# Patient Record
Sex: Female | Born: 1935 | Race: White | Hispanic: No | Smoking: Former smoker
Health system: Southern US, Community
[De-identification: ages and names within clinical notes are randomized; demographics above are authoritative.]

## PROBLEM LIST (undated history)

## (undated) DIAGNOSIS — J189 Pneumonia, unspecified organism: Secondary | ICD-10-CM

## (undated) DIAGNOSIS — I10 Essential (primary) hypertension: Secondary | ICD-10-CM

## (undated) DIAGNOSIS — R011 Cardiac murmur, unspecified: Secondary | ICD-10-CM

## (undated) DIAGNOSIS — E785 Hyperlipidemia, unspecified: Secondary | ICD-10-CM

## (undated) DIAGNOSIS — I6529 Occlusion and stenosis of unspecified carotid artery: Secondary | ICD-10-CM

## (undated) DIAGNOSIS — M1990 Unspecified osteoarthritis, unspecified site: Secondary | ICD-10-CM

## (undated) HISTORY — DX: Occlusion and stenosis of unspecified carotid artery: I65.29

## (undated) HISTORY — PX: EYE SURGERY: SHX253

## (undated) HISTORY — DX: Hyperlipidemia, unspecified: E78.5

## (undated) HISTORY — DX: Essential (primary) hypertension: I10

---

## 1960-10-09 HISTORY — PX: OTHER SURGICAL HISTORY: SHX169

## 1993-10-09 HISTORY — PX: ORIF ANKLE FRACTURE: SUR919

## 2004-03-09 DIAGNOSIS — I10 Essential (primary) hypertension: Secondary | ICD-10-CM | POA: Insufficient documentation

## 2004-03-09 HISTORY — DX: Essential (primary) hypertension: I10

## 2004-09-20 ENCOUNTER — Ambulatory Visit: Payer: Self-pay | Admitting: Family Medicine

## 2004-09-20 LAB — CONVERTED CEMR LAB: Blood Glucose, Fasting: 102 mg/dL

## 2004-12-07 DIAGNOSIS — E785 Hyperlipidemia, unspecified: Secondary | ICD-10-CM | POA: Insufficient documentation

## 2004-12-07 HISTORY — DX: Hyperlipidemia, unspecified: E78.5

## 2004-12-15 ENCOUNTER — Ambulatory Visit: Payer: Self-pay | Admitting: Family Medicine

## 2004-12-15 ENCOUNTER — Other Ambulatory Visit: Admission: RE | Admit: 2004-12-15 | Discharge: 2004-12-15 | Payer: Self-pay | Admitting: Family Medicine

## 2004-12-22 ENCOUNTER — Encounter: Payer: Self-pay | Admitting: Family Medicine

## 2004-12-22 LAB — CONVERTED CEMR LAB: Pap Smear: NORMAL

## 2004-12-27 ENCOUNTER — Ambulatory Visit: Payer: Self-pay | Admitting: Family Medicine

## 2005-01-02 ENCOUNTER — Ambulatory Visit: Payer: Self-pay | Admitting: Family Medicine

## 2005-01-02 ENCOUNTER — Encounter: Admission: RE | Admit: 2005-01-02 | Discharge: 2005-01-02 | Payer: Self-pay | Admitting: Family Medicine

## 2005-03-17 ENCOUNTER — Ambulatory Visit: Payer: Self-pay | Admitting: Family Medicine

## 2005-03-21 ENCOUNTER — Ambulatory Visit: Payer: Self-pay | Admitting: Family Medicine

## 2005-05-02 ENCOUNTER — Ambulatory Visit: Payer: Self-pay | Admitting: Family Medicine

## 2005-06-19 ENCOUNTER — Ambulatory Visit: Payer: Self-pay | Admitting: Family Medicine

## 2005-06-23 ENCOUNTER — Ambulatory Visit: Payer: Self-pay | Admitting: Family Medicine

## 2005-12-07 DIAGNOSIS — R7989 Other specified abnormal findings of blood chemistry: Secondary | ICD-10-CM | POA: Insufficient documentation

## 2005-12-15 ENCOUNTER — Ambulatory Visit: Payer: Self-pay | Admitting: Family Medicine

## 2005-12-15 LAB — CONVERTED CEMR LAB: Blood Glucose, Fasting: 106 mg/dL

## 2005-12-20 ENCOUNTER — Encounter: Payer: Self-pay | Admitting: Family Medicine

## 2005-12-20 ENCOUNTER — Other Ambulatory Visit: Admission: RE | Admit: 2005-12-20 | Discharge: 2005-12-20 | Payer: Self-pay | Admitting: Family Medicine

## 2005-12-20 ENCOUNTER — Ambulatory Visit: Payer: Self-pay | Admitting: Family Medicine

## 2005-12-20 LAB — CONVERTED CEMR LAB: Pap Smear: NORMAL

## 2006-01-04 ENCOUNTER — Encounter: Payer: Self-pay | Admitting: Family Medicine

## 2006-01-04 LAB — CONVERTED CEMR LAB: TSH: 1.55 microintl units/mL

## 2006-01-05 ENCOUNTER — Encounter: Admission: RE | Admit: 2006-01-05 | Discharge: 2006-01-05 | Payer: Self-pay | Admitting: Family Medicine

## 2006-01-10 ENCOUNTER — Ambulatory Visit: Payer: Self-pay | Admitting: Family Medicine

## 2006-01-18 ENCOUNTER — Ambulatory Visit: Payer: Self-pay | Admitting: Family Medicine

## 2006-01-18 LAB — CONVERTED CEMR LAB: Blood Glucose, Fasting: 98 mg/dL

## 2006-01-31 ENCOUNTER — Encounter: Admission: RE | Admit: 2006-01-31 | Discharge: 2006-01-31 | Payer: Self-pay | Admitting: Family Medicine

## 2006-02-20 ENCOUNTER — Ambulatory Visit: Payer: Self-pay | Admitting: Family Medicine

## 2006-07-24 ENCOUNTER — Ambulatory Visit: Payer: Self-pay | Admitting: Family Medicine

## 2006-08-02 ENCOUNTER — Encounter: Admission: RE | Admit: 2006-08-02 | Discharge: 2006-08-02 | Payer: Self-pay | Admitting: Family Medicine

## 2006-08-23 ENCOUNTER — Ambulatory Visit: Payer: Self-pay | Admitting: Family Medicine

## 2006-12-14 ENCOUNTER — Encounter: Payer: Self-pay | Admitting: Family Medicine

## 2006-12-14 LAB — CONVERTED CEMR LAB: Pap Smear: NORMAL

## 2006-12-27 ENCOUNTER — Ambulatory Visit: Payer: Self-pay | Admitting: Family Medicine

## 2006-12-27 LAB — CONVERTED CEMR LAB
ALT: 26 units/L (ref 0–40)
Alkaline Phosphatase: 76 units/L (ref 39–117)
BUN: 16 mg/dL (ref 6–23)
CO2: 32 meq/L (ref 19–32)
Calcium: 9.4 mg/dL (ref 8.4–10.5)
Creatinine, Ser: 0.7 mg/dL (ref 0.4–1.2)
GFR calc Af Amer: 107 mL/min
Microalb Creat Ratio: 4.1 mg/g (ref 0.0–30.0)
TSH: 1.57 microintl units/mL
Total CHOL/HDL Ratio: 3.4
Total Protein: 6.7 g/dL (ref 6.0–8.3)

## 2006-12-31 ENCOUNTER — Encounter: Payer: Self-pay | Admitting: Family Medicine

## 2006-12-31 ENCOUNTER — Other Ambulatory Visit: Admission: RE | Admit: 2006-12-31 | Discharge: 2006-12-31 | Payer: Self-pay | Admitting: Family Medicine

## 2006-12-31 ENCOUNTER — Ambulatory Visit: Payer: Self-pay | Admitting: Family Medicine

## 2007-01-07 ENCOUNTER — Encounter: Admission: RE | Admit: 2007-01-07 | Discharge: 2007-01-07 | Payer: Self-pay | Admitting: Family Medicine

## 2007-01-09 ENCOUNTER — Encounter: Payer: Self-pay | Admitting: Family Medicine

## 2007-01-21 ENCOUNTER — Ambulatory Visit: Payer: Self-pay | Admitting: Family Medicine

## 2007-02-26 ENCOUNTER — Ambulatory Visit: Payer: Self-pay | Admitting: Family Medicine

## 2007-04-09 ENCOUNTER — Ambulatory Visit: Payer: Self-pay | Admitting: Family Medicine

## 2007-04-09 LAB — CONVERTED CEMR LAB: ALT: 16 units/L (ref 0–35)

## 2007-05-23 ENCOUNTER — Encounter: Payer: Self-pay | Admitting: Family Medicine

## 2007-05-24 ENCOUNTER — Ambulatory Visit: Payer: Self-pay | Admitting: Family Medicine

## 2007-05-26 LAB — CONVERTED CEMR LAB
ALT: 20 units/L (ref 0–35)
AST: 19 units/L (ref 0–37)
Cholesterol: 219 mg/dL (ref 0–200)
Direct LDL: 143 mg/dL
VLDL: 21 mg/dL (ref 0–40)

## 2007-05-30 ENCOUNTER — Ambulatory Visit: Payer: Self-pay | Admitting: Family Medicine

## 2007-08-02 ENCOUNTER — Ambulatory Visit: Payer: Self-pay | Admitting: Family Medicine

## 2007-09-03 ENCOUNTER — Ambulatory Visit: Payer: Self-pay | Admitting: Family Medicine

## 2007-09-23 ENCOUNTER — Telehealth: Payer: Self-pay | Admitting: Family Medicine

## 2008-01-06 ENCOUNTER — Ambulatory Visit: Payer: Self-pay | Admitting: Family Medicine

## 2008-01-06 LAB — CONVERTED CEMR LAB
ALT: 22 units/L (ref 0–35)
AST: 22 units/L (ref 0–37)
Albumin: 3.9 g/dL (ref 3.5–5.2)
Alkaline Phosphatase: 72 units/L (ref 39–117)
BUN: 11 mg/dL (ref 6–23)
CO2: 32 meq/L (ref 19–32)
Chloride: 103 meq/L (ref 96–112)
Cholesterol: 236 mg/dL (ref 0–200)
Creatinine, Ser: 0.9 mg/dL (ref 0.4–1.2)
Creatinine,U: 220.9 mg/dL
TSH: 2.57 microintl units/mL (ref 0.35–5.50)

## 2008-01-08 ENCOUNTER — Encounter: Admission: RE | Admit: 2008-01-08 | Discharge: 2008-01-08 | Payer: Self-pay | Admitting: Family Medicine

## 2008-01-10 ENCOUNTER — Ambulatory Visit: Payer: Self-pay | Admitting: Family Medicine

## 2008-01-13 ENCOUNTER — Encounter (INDEPENDENT_AMBULATORY_CARE_PROVIDER_SITE_OTHER): Payer: Self-pay | Admitting: *Deleted

## 2008-02-05 ENCOUNTER — Encounter (INDEPENDENT_AMBULATORY_CARE_PROVIDER_SITE_OTHER): Payer: Self-pay | Admitting: *Deleted

## 2008-02-05 ENCOUNTER — Ambulatory Visit: Payer: Self-pay | Admitting: Family Medicine

## 2008-02-05 LAB — CONVERTED CEMR LAB
OCCULT 2: NEGATIVE
OCCULT 3: NEGATIVE

## 2008-04-15 ENCOUNTER — Ambulatory Visit: Payer: Self-pay | Admitting: Family Medicine

## 2008-04-15 LAB — CONVERTED CEMR LAB
CO2: 30 meq/L (ref 19–32)
Calcium: 9.8 mg/dL (ref 8.4–10.5)
Chloride: 104 meq/L (ref 96–112)
GFR calc non Af Amer: 66 mL/min
Sodium: 141 meq/L (ref 135–145)

## 2008-04-22 ENCOUNTER — Ambulatory Visit: Payer: Self-pay | Admitting: Family Medicine

## 2008-07-27 ENCOUNTER — Ambulatory Visit: Payer: Self-pay | Admitting: Family Medicine

## 2008-07-27 LAB — CONVERTED CEMR LAB
ALT: 16 units/L (ref 0–35)
AST: 16 units/L (ref 0–37)
Albumin: 3.7 g/dL (ref 3.5–5.2)
BUN: 13 mg/dL (ref 6–23)
CO2: 31 meq/L (ref 19–32)
Calcium: 9.6 mg/dL (ref 8.4–10.5)
Chloride: 103 meq/L (ref 96–112)
Cholesterol: 232 mg/dL (ref 0–200)
Creatinine, Ser: 0.9 mg/dL (ref 0.4–1.2)
Direct LDL: 144.8 mg/dL
Total Bilirubin: 0.8 mg/dL (ref 0.3–1.2)
Total CHOL/HDL Ratio: 5.5
Total Protein: 6.8 g/dL (ref 6.0–8.3)
Triglycerides: 168 mg/dL — ABNORMAL HIGH (ref 0–149)

## 2008-07-30 ENCOUNTER — Ambulatory Visit: Payer: Self-pay | Admitting: Family Medicine

## 2008-11-04 ENCOUNTER — Ambulatory Visit: Payer: Self-pay | Admitting: Family Medicine

## 2008-12-28 ENCOUNTER — Ambulatory Visit: Payer: Self-pay | Admitting: Family Medicine

## 2008-12-28 LAB — CONVERTED CEMR LAB
CO2: 32 meq/L (ref 19–32)
Calcium: 9.8 mg/dL (ref 8.4–10.5)
Creatinine, Ser: 0.9 mg/dL (ref 0.4–1.2)
GFR calc non Af Amer: 65.46 mL/min (ref >60)
Sodium: 144 meq/L (ref 135–145)

## 2009-01-07 ENCOUNTER — Ambulatory Visit: Payer: Self-pay | Admitting: Family Medicine

## 2009-01-14 ENCOUNTER — Encounter: Admission: RE | Admit: 2009-01-14 | Discharge: 2009-01-14 | Payer: Self-pay | Admitting: Family Medicine

## 2009-01-28 ENCOUNTER — Ambulatory Visit: Payer: Self-pay | Admitting: Family Medicine

## 2009-01-28 LAB — CONVERTED CEMR LAB
Albumin: 3.9 g/dL (ref 3.5–5.2)
Alkaline Phosphatase: 74 units/L (ref 39–117)
BUN: 18 mg/dL (ref 6–23)
CO2: 31 meq/L (ref 19–32)
Calcium: 9.8 mg/dL (ref 8.4–10.5)
Cholesterol: 232 mg/dL — ABNORMAL HIGH (ref 0–200)
Creatinine, Ser: 1 mg/dL (ref 0.4–1.2)
Creatinine,U: 131 mg/dL
GFR calc non Af Amer: 57.95 mL/min (ref >60)
Glucose, Bld: 99 mg/dL (ref 70–99)
HDL: 44.8 mg/dL (ref >39.00)
Microalb Creat Ratio: 13.7 mg/g (ref 0.0–30.0)
Sodium: 142 meq/L (ref 135–145)
TSH: 1.48 microintl units/mL (ref 0.35–5.50)
Total Protein: 7.1 g/dL (ref 6.0–8.3)
Triglycerides: 117 mg/dL (ref 0.0–149.0)

## 2009-02-02 ENCOUNTER — Other Ambulatory Visit: Admission: RE | Admit: 2009-02-02 | Discharge: 2009-02-02 | Payer: Self-pay | Admitting: Family Medicine

## 2009-02-02 ENCOUNTER — Ambulatory Visit: Payer: Self-pay | Admitting: Family Medicine

## 2009-02-02 ENCOUNTER — Encounter: Payer: Self-pay | Admitting: Family Medicine

## 2009-02-02 DIAGNOSIS — T452X1A Poisoning by vitamins, accidental (unintentional), initial encounter: Secondary | ICD-10-CM | POA: Insufficient documentation

## 2009-02-04 ENCOUNTER — Encounter (INDEPENDENT_AMBULATORY_CARE_PROVIDER_SITE_OTHER): Payer: Self-pay | Admitting: *Deleted

## 2009-02-11 ENCOUNTER — Ambulatory Visit: Payer: Self-pay | Admitting: Family Medicine

## 2009-02-11 LAB — CONVERTED CEMR LAB
OCCULT 1: NEGATIVE
OCCULT 2: NEGATIVE

## 2009-02-15 ENCOUNTER — Encounter (INDEPENDENT_AMBULATORY_CARE_PROVIDER_SITE_OTHER): Payer: Self-pay | Admitting: *Deleted

## 2009-05-05 ENCOUNTER — Ambulatory Visit: Payer: Self-pay | Admitting: Family Medicine

## 2009-05-06 LAB — CONVERTED CEMR LAB: Vit D, 25-Hydroxy: 113 ng/mL — ABNORMAL HIGH (ref 30–89)

## 2009-05-13 ENCOUNTER — Ambulatory Visit: Payer: Self-pay | Admitting: Family Medicine

## 2009-08-23 ENCOUNTER — Ambulatory Visit: Payer: Self-pay | Admitting: Family Medicine

## 2010-01-17 ENCOUNTER — Encounter: Admission: RE | Admit: 2010-01-17 | Discharge: 2010-01-17 | Payer: Self-pay | Admitting: Family Medicine

## 2010-01-31 ENCOUNTER — Ambulatory Visit: Payer: Self-pay | Admitting: Family Medicine

## 2010-02-01 LAB — CONVERTED CEMR LAB
AST: 19 units/L (ref 0–37)
Bilirubin, Direct: 0.1 mg/dL (ref 0.0–0.3)
Cholesterol: 233 mg/dL — ABNORMAL HIGH (ref 0–200)
Creatinine, Ser: 1 mg/dL (ref 0.4–1.2)
Direct LDL: 173.8 mg/dL
GFR calc non Af Amer: 57.78 mL/min (ref >60)
Glucose, Bld: 103 mg/dL — ABNORMAL HIGH (ref 70–99)
Microalb, Ur: 1.1 mg/dL (ref 0.0–1.9)
Sodium: 144 meq/L (ref 135–145)
TSH: 2.2 microintl units/mL (ref 0.35–5.50)
Total Bilirubin: 0.5 mg/dL (ref 0.3–1.2)
Total CHOL/HDL Ratio: 5
Total Protein: 6.9 g/dL (ref 6.0–8.3)
Triglycerides: 126 mg/dL (ref 0.0–149.0)
VLDL: 25.2 mg/dL (ref 0.0–40.0)
Vit D, 25-Hydroxy: 118 ng/mL — ABNORMAL HIGH (ref 30–89)

## 2010-02-03 ENCOUNTER — Other Ambulatory Visit: Admission: RE | Admit: 2010-02-03 | Discharge: 2010-02-03 | Payer: Self-pay | Admitting: Family Medicine

## 2010-02-03 ENCOUNTER — Ambulatory Visit: Payer: Self-pay | Admitting: Family Medicine

## 2010-02-03 LAB — CONVERTED CEMR LAB: Pap Smear: NORMAL

## 2010-02-07 ENCOUNTER — Telehealth: Payer: Self-pay | Admitting: Family Medicine

## 2010-02-10 ENCOUNTER — Encounter (INDEPENDENT_AMBULATORY_CARE_PROVIDER_SITE_OTHER): Payer: Self-pay | Admitting: *Deleted

## 2010-02-10 LAB — CONVERTED CEMR LAB: Pap Smear: NEGATIVE

## 2010-02-18 ENCOUNTER — Ambulatory Visit: Payer: Self-pay | Admitting: Family Medicine

## 2010-02-18 LAB — CONVERTED CEMR LAB
OCCULT 1: NEGATIVE
OCCULT 2: NEGATIVE

## 2010-02-21 ENCOUNTER — Encounter (INDEPENDENT_AMBULATORY_CARE_PROVIDER_SITE_OTHER): Payer: Self-pay | Admitting: *Deleted

## 2010-05-11 ENCOUNTER — Encounter (INDEPENDENT_AMBULATORY_CARE_PROVIDER_SITE_OTHER): Payer: Self-pay | Admitting: *Deleted

## 2010-06-07 ENCOUNTER — Telehealth: Payer: Self-pay | Admitting: Family Medicine

## 2010-07-20 ENCOUNTER — Telehealth (INDEPENDENT_AMBULATORY_CARE_PROVIDER_SITE_OTHER): Payer: Self-pay | Admitting: *Deleted

## 2010-07-21 ENCOUNTER — Ambulatory Visit: Payer: Self-pay | Admitting: Family Medicine

## 2010-07-21 LAB — CONVERTED CEMR LAB
ALT: 46 units/L — ABNORMAL HIGH (ref 0–35)
AST: 41 units/L — ABNORMAL HIGH (ref 0–37)
Alkaline Phosphatase: 81 units/L (ref 39–117)
Bilirubin, Direct: 0.1 mg/dL (ref 0.0–0.3)
CO2: 30 meq/L (ref 19–32)
Calcium: 9.8 mg/dL (ref 8.4–10.5)
Chloride: 102 meq/L (ref 96–112)
Potassium: 4.5 meq/L (ref 3.5–5.1)
Sodium: 139 meq/L (ref 135–145)
Total CHOL/HDL Ratio: 4
Total Protein: 6.8 g/dL (ref 6.0–8.3)

## 2010-07-28 ENCOUNTER — Ambulatory Visit: Payer: Self-pay | Admitting: Family Medicine

## 2010-10-30 ENCOUNTER — Encounter: Payer: Self-pay | Admitting: Family Medicine

## 2010-11-09 NOTE — Progress Notes (Signed)
Summary: regarding cholesterol meds  Phone Note From Pharmacy   Caller: [facility] Summary of Call: Pharmacy called to ask if pt is to be on both simvastatin and pravastatin.  She has been taking pravastatin but brought in script for simvastatin and told pharmacist she is to take both.  I advised pharmacist that per Dr. Lyndel Pleasure note from 4/28 pt was to change from pravastatin to simvastatin.  They will advise pt.  Please review and advise. Initial call taken by: Lowella Petties CMA,  June 07, 2010 10:47 AM  Follow-up for Phone Call        agreed, she should be on simva with follow up in 10/11 with schaller.  Follow-up by: Crawford Givens MD,  June 07, 2010 12:42 PM

## 2010-11-09 NOTE — Assessment & Plan Note (Signed)
Summary: f/u dlo   Vital Signs:  Patient profile:   76 year old female Weight:      131.50 pounds Temp:     98.3 degrees F oral Pulse rate:   60 / minute Pulse rhythm:   regular BP sitting:   156 / 78  (left arm) Cuff size:   regular  Vitals Entered By: Sydell Axon LPN (July 28, 2010 8:12 AM) CC: follow-up visit   History of Present Illness: Pt here for followup of BP and Chol. She has been monitoring her pressure at home with nos. 110s/high 50s. She feels well and thinks she gets up tight when coming in here.  She is now taking Simva 80mg  w/o difficulty and had labs done prior. She has no complaints except loneliness since her husband died.  Problems Prior to Update: 1)  Vitamin D Intoxication  (ICD-963.5) 2)  Special Screening Malig Neoplasms Other Sites  (ICD-V76.49) 3)  Preventive Health Care  (ICD-V70.0) 4)  Hyperglycemia  (ICD-790.6) 5)  Hyperlipidemia  (ICD-272.4) 6)  Hypertension  (ICD-401.9)  Medications Prior to Update: 1)  Metoprolol Tartrate 100 Mg Tabs (Metoprolol Tartrate) .... Take 1 Tablet By Mouth Twice A Day 2)  Pravachol 40 Mg Tabs (Pravastatin Sodium) .... One Tab By Mouth At Bedtime 3)  Lisinopril 20 Mg  Tabs (Lisinopril) .... One Tab By Mouth Twice A Day.Marland Kitchen 4)  Amlodipine Besylate 2.5 Mg Tabs (Amlodipine Besylate) .... One Tab By Mouth At Night 5)  Simvastatin 80 Mg Tabs (Simvastatin) .... One Tab By Mouth At Night 6)  Zostavax 82956 Unt/0.62ml Solr (Zoster Vaccine Live) .... Administer 1 Immun  Allergies: No Known Drug Allergies  Physical Exam  General:  Well-developed,well-nourished,in no acute distress; alert,appropriate and cooperative throughout examination, comfortable. Head:  Normocephalic and atraumatic without obvious abnormalities. No apparent alopecia or balding. Eyes:  Conjunctiva clear bilaterally.  Ears:  External ear exam shows no significant lesions or deformities.  Otoscopic examination reveals clear canals, tympanic membranes are  intact bilaterally without bulging, retraction, inflammation or discharge. Hearing is grossly normal bilaterally. Nose:  External nasal examination shows no deformity or inflammation. Nasal mucosa are pink and moist without lesions or exudates. Mouth:  Oral mucosa and oropharynx without lesions or exudates.  Teeth in good repair. Neck:  No deformities, masses, or tenderness noted. Chest Wall:  No deformities, masses, or tenderness noted. Lungs:  Normal respiratory effort, chest expands symmetrically. Lungs are clear to auscultation, no crackles or wheezes. Heart:  Normal rate and regular rhythm. S1 and S2 normal without gallop, murmur, click, rub or other extra sounds.   Impression & Recommendations:  Problem # 1:  HYPERTENSION (ICD-401.9) Assessment Unchanged White coat syndrome via her nos and ours. Cont curr meds. Her updated medication list for this problem includes:    Metoprolol Tartrate 100 Mg Tabs (Metoprolol tartrate) .Marland Kitchen... Take 1 tablet by mouth twice a day    Lisinopril 20 Mg Tabs (Lisinopril) ..... One tab by mouth twice a day..    Amlodipine Besylate 2.5 Mg Tabs (Amlodipine besylate) ..... One tab by mouth at night  BP today: 156/78 Prior BP: 128/72 (02/03/2010)  Labs Reviewed: K+: 4.5 (07/21/2010) Creat: : 1.0 (07/21/2010)   Chol: 163 (07/21/2010)   HDL: 39.50 (07/21/2010)   LDL: 106 (07/21/2010)   TG: 88.0 (07/21/2010)  Problem # 2:  HYPERLIPIDEMIA (ICD-272.4) Assessment: Improved  Nos great but new indication precludes using 80mg  of Simva.  Will have her halve 80mg  pills and recheck in May at Comp Exam.  Her updated medication list for this problem includes:    Pravachol 40 Mg Tabs (Pravastatin sodium) ..... One tab by mouth at bedtime    Simvastatin 80 Mg Tabs (Simvastatin) ..... One tab by mouth at night  Labs Reviewed: SGOT: 41 (07/21/2010)   SGPT: 46 (07/21/2010)   HDL:39.50 (07/21/2010), 47.30 (01/31/2010)  LDL:106 (07/21/2010), DEL (07/27/2008)  Chol:163  (07/21/2010), 233 (01/31/2010)  Trig:88.0 (07/21/2010), 126.0 (01/31/2010)  Complete Medication List: 1)  Metoprolol Tartrate 100 Mg Tabs (Metoprolol tartrate) .... Take 1 tablet by mouth twice a day 2)  Pravachol 40 Mg Tabs (Pravastatin sodium) .... One tab by mouth at bedtime 3)  Lisinopril 20 Mg Tabs (Lisinopril) .... One tab by mouth twice a day.Marland Kitchen 4)  Amlodipine Besylate 2.5 Mg Tabs (Amlodipine besylate) .... One tab by mouth at night 5)  Simvastatin 80 Mg Tabs (Simvastatin) .... One tab by mouth at night  Patient Instructions: 1)  RTC early May Comp Exam, labs prior   Orders Added: 1)  Est. Patient Level III [13086]    Current Allergies (reviewed today): No known allergies  Appended Document: f/u dlo Flu Vaccine Consent Questions     Do you have a history of severe allergic reactions to this vaccine? no    Any prior history of allergic reactions to egg and/or gelatin? no    Do you have a sensitivity to the preservative Thimersol? no    Do you have a past history of Guillan-Barre Syndrome? no    Do you currently have an acute febrile illness? no    Have you ever had a severe reaction to latex? no    Vaccine information given and explained to patient? yes    Are you currently pregnant? no    Lot Number:[id number]   Exp Date:04/08/2011   Site Given  Left Deltoid IM

## 2010-11-09 NOTE — Progress Notes (Signed)
----   Converted from flag ---- ---- 07/20/2010 1:20 PM, Shaune Leeks MD wrote: hepatic chol prof 272.4  ---- 07/20/2010 12:12 PM, Mills Koller wrote: F/U labs ------------------------------

## 2010-11-09 NOTE — Letter (Signed)
Summary: Nadara Eaton letter  [place] at [facility]  [address] [place], [place] [address]   Phone: [phone]  Fax: [phone]       05/11/2010 MRN: [id number]  [facility] [address] [place], [place]  [address]  Dear Ms. Lill,  [facility] [place] and [facility] announce the retirement of Arta Silence, M.D., from full-time practice at the [facility] office effective April 07, 2010 and his plans of returning part-time.  It is important to Dr. Hetty Ely and to our practice that you understand that [facility] has seven physicians in our office for your health care needs.  We will continue to offer the same exceptional care that you have today.    Dr. Hetty Ely has spoken to many of you about his plans for retirement and returning part-time in the fall.   We will continue to work with you through the transition to schedule appointments for you in the office and meet the high standards that [facility] is committed to.   Again, it is with great pleasure that we share the news that Dr. Hetty Ely will return to [facility] in October of 2011 with a reduced schedule.    If you have any questions, or would like to request an appointment with one of our physicians, please call us at [phone] and press the option for Scheduling an appointment.  We take pleasure in providing you with excellent patient care and look forward to seeing you at your next office visit.  Our [facility] Physicians are:  Tillman Abide, M.D. Laurita Quint, M.D. Roxy Manns, M.D. Kerby Nora, M.D. Hannah Beat, M.D. Ruthe Mannan, M.D. We proudly welcomed Raechel Ache, M.D. and Eustaquio Boyden, M.D. to the practice in July/August 2011.  Sincerely,  [facility]

## 2010-11-09 NOTE — Letter (Signed)
Summary: Results Follow up Letter  [place] at [facility]  [address] [place], [place] [address]   Phone: [phone]  Fax: [phone]    02/21/2010 MRN: [id number]  [facility] [address] [place], [place]  [address]  Dear Ms. Kerekes,  The following are the results of your recent test(s):  Test         Result    Pap Smear:        Normal _____  Not Normal _____ Comments: ______________________________________________________ Cholesterol: LDL(Bad cholesterol):         Your goal is less than:         HDL (Good cholesterol):       Your goal is more than: Comments:  ______________________________________________________ Mammogram:        Normal _____  Not Normal _____ Comments:  ___________________________________________________________________ Hemoccult:        Normal __X___  Not normal _______ Comments:  Please repeat in one year.  _____________________________________________________________________ Other Tests:    We routinely do not discuss normal results over the telephone.  If you desire a copy of the results, or you have any questions about this information we can discuss them at your next office visit.   Sincerely,     Laurita Quint, MD

## 2010-11-09 NOTE — Letter (Signed)
Summary: Results Follow up Letter  [place] at [facility]  [address] [place], [place] [address]   Phone: [phone]  Fax: [phone]    02/10/2010 MRN: [id number]  [facility] [address] [place], [place]  [address]  Dear Jacqueline Cline,  The following are the results of your recent test(s):  Test         Result    Pap Smear:        Normal _X____  Not Normal _____ Comments: Please repeat in one year. ______________________________________________________ Cholesterol: LDL(Bad cholesterol):         Your goal is less than:         HDL (Good cholesterol):       Your goal is more than: Comments:  ______________________________________________________ Mammogram:        Normal _____  Not Normal _____ Comments:  ___________________________________________________________________ Hemoccult:        Normal _____  Not normal _______ Comments:    _____________________________________________________________________ Other Tests:    We routinely do not discuss normal results over the telephone.  If you desire a copy of the results, or you have any questions about this information we can discuss them at your next office visit.   Sincerely,     Laurita Quint, MD

## 2010-11-09 NOTE — Assessment & Plan Note (Signed)
Summary: CPX / LFW   Vital Signs:  Patient profile:   76 year old female Weight:      134.25 pounds BMI:     23.13 Temp:     98.5 degrees F oral Pulse rate:   60 / minute Pulse rhythm:   regular BP sitting:   128 / 72  (left arm) Cuff size:   regular  Vitals Entered By: Sydell Axon LPN (February 03, 2010 8:20 AM) CC: 30 Minute checkup, hemoccult cards given to patient   History of Present Illness: Pt here for followup. She feels well and has no complaints. She sleeps well.  Preventive Screening-Counseling & Management  Alcohol-Tobacco     Alcohol drinks/day: 0     Smoking Status: quit     Packs/Day: 2.0     Year Quit: 1962     Pack years: 2     Passive Smoke Exposure: no  Caffeine-Diet-Exercise     Caffeine use/day: 0     Does Patient Exercise: yes     Type of exercise: rides bike, exercises     Times/week: 4  Problems Prior to Update: 1)  Vitamin D Intoxication  (ICD-963.5) 2)  Special Screening Malig Neoplasms Other Sites  (ICD-V76.49) 3)  Preventive Health Care  (ICD-V70.0) 4)  Hyperglycemia  (ICD-790.6) 5)  Hyperlipidemia  (ICD-272.4) 6)  Hypertension  (ICD-401.9)  Medications Prior to Update: 1)  Metoprolol Tartrate 100 Mg Tabs (Metoprolol Tartrate) .... Take 1 Tablet By Mouth Twice A Day 2)  Pravachol 40 Mg Tabs (Pravastatin Sodium) .... One Tab By Mouth At Bedtime 3)  Lisinopril 20 Mg  Tabs (Lisinopril) .... One Tab By Mouth Twice A Day.Marland Kitchen 4)  Amlodipine Besylate 2.5 Mg Tabs (Amlodipine Besylate) .... One Tab By Mouth At Night  Allergies: No Known Drug Allergies  Past History:  Past Medical History: Last updated: 01/09/2007 Hypertension (03/09/2004) Hyperlipidemia (12/07/2004)  Past Surgical History: Last updated: 01/09/2007 NSVD 1962 DYSENTERY FOOD POISONING  1980'S FRACTURE R ANKLE 1995 CATTARACT REM OD  01/05/2005 CATTARACT REM OS 03/14/2005  Family History: Last updated: 02/03/2010 FATHER DEC 57YOA MI DEPRESS MOTHER DEC 81YOA CABG,  POSTOP  SISTER A 79  HTN HYPERTHYR-REMOVED  DECR HEARING CV: +FATHER, DECEASED WITH MI HBP: + SELF ,+ SISTER DM: NEGATIVE GOUT/ARTHRITIS: PROSTATE CANCER: p UNCLES-- UNKNOWN TYPE BREAT/OVARIAN / UTERINE CANCER: COLON CANCER: DEPRESSION: + FATHER ETOH/DRUG ABUSE: NEGATIVE OTHER: + STROKE MGM  Social History: Last updated: 01/10/2008 Marital Status: Married: WIDOWED HUSABAND RAYMOND MATHEW Bartolo DIED 2004-06-30 Children: SON-- LOCAL Occupation:  [employer]  Risk Factors: Alcohol Use: 0 (02/03/2010) Caffeine Use: 0 (02/03/2010) Exercise: yes (02/03/2010)  Risk Factors: Smoking Status: quit (02/03/2010) Packs/Day: 2.0 (02/03/2010) Passive Smoke Exposure: no (02/03/2010)  Family History: FATHER DEC 57YOA MI DEPRESS MOTHER DEC 81YOA CABG, POSTOP  SISTER A 77  HTN HYPERTHYR-REMOVED  DECR HEARING CV: +FATHER, DECEASED WITH MI HBP: + SELF ,+ SISTER DM: NEGATIVE GOUT/ARTHRITIS: PROSTATE CANCER: p UNCLES-- UNKNOWN TYPE BREAT/OVARIAN / UTERINE CANCER: COLON CANCER: DEPRESSION: + FATHER ETOH/DRUG ABUSE: NEGATIVE OTHER: + STROKE MGM  Review of Systems General:  Denies chills, fatigue, fever, sweats, weakness, and weight loss. Eyes:  Denies blurring, discharge, and eye pain. ENT:  Denies decreased hearing, earache, and ringing in ears. CV:  Denies chest pain or discomfort, fainting, fatigue, palpitations, shortness of breath with exertion, and swelling of feet. Resp:  Denies cough, shortness of breath, and wheezing. GI:  Denies abdominal pain, bloody stools, change in bowel habits, constipation, dark tarry stools, diarrhea,  indigestion, loss of appetite, nausea, vomiting, vomiting blood, and yellowish skin color. GU:  Denies discharge, dysuria, nocturia, and urinary frequency. MS:  Denies joint pain, low back pain, muscle aches, cramps, and stiffness. Derm:  Denies dryness, itching, and rash. Neuro:  Denies numbness, poor balance, tingling, and tremors.  Physical  Exam  General:  Well-developed,well-nourished,in no acute distress; alert,appropriate and cooperative throughout examination, comfortable. Head:  Normocephalic and atraumatic without obvious abnormalities. No apparent alopecia or balding. Eyes:  Conjunctiva clear bilaterally.  Ears:  External ear exam shows no significant lesions or deformities.  Otoscopic examination reveals clear canals, tympanic membranes are intact bilaterally without bulging, retraction, inflammation or discharge. Hearing is grossly normal bilaterally. Nose:  External nasal examination shows no deformity or inflammation. Nasal mucosa are pink and moist without lesions or exudates. Mouth:  Oral mucosa and oropharynx without lesions or exudates.  Teeth in good repair. Neck:  No deformities, masses, or tenderness noted. Chest Wall:  No deformities, masses, or tenderness noted. Breasts:  No mass, nodules, thickening, tenderness, bulging, retraction, inflamation, nipple discharge or skin changes noted.  Pendulous. Lungs:  Normal respiratory effort, chest expands symmetrically. Lungs are clear to auscultation, no crackles or wheezes. Heart:  Normal rate and regular rhythm. S1 and S2 normal without gallop, murmur, click, rub or other extra sounds. Abdomen:  Bowel sounds positive,abdomen soft and non-tender without masses, organomegaly or hernias noted. Rectal:  No external abnormalities noted. Normal sphincter tone. No rectal masses or tenderness. G neg. Genitalia:  Pelvic Exam:        External: normal female genitalia without lesions or masses        Vagina: normal without lesions or masses        Cervix: normal without lesions or masses        Adnexa: normal bimanual exam without masses or fullness        Uterus: normal by palpation        Pap smear: performed Msk:  No deformity or scoliosis noted of thoracic or lumbar spine.   Pulses:  R and L carotid,radial,femoral,dorsalis pedis and posterior tibial pulses are full and  equal bilaterally Extremities:  No clubbing, cyanosis, edema, or deformity noted with normal full range of motion of all joints.   Neurologic:  No cranial nerve deficits noted. Station and gait are normal. Sensory, motor and coordinative functions appear intact. Skin:  Intact without suspicious lesions or rashes, very tanned still. Cervical Nodes:  No lymphadenopathy noted Axillary Nodes:  No palpable lymphadenopathy Inguinal Nodes:  No significant adenopathy Psych:  Cognition and judgment appear intact. Alert and cooperative with normal attention span and concentration. No apparent delusions, illusions, hallucinations   Impression & Recommendations:  Problem # 1:  VITAMIN D INTOXICATION (ICD-963.5) Assessment Improved Has decreased her Tanning bed exposure but still gets in as she is very tanned still.  Problem # 2:  HYPERGLYCEMIA (ICD-790.6) Assessment: Unchanged Stable. Microalb nml. Encouraged to avoid sweets and carbs.  Problem # 3:  HYPERLIPIDEMIA (ICD-272.4) Assessment: Unchanged LDL too high. Will change to Simvastatin in Aug with expectation of seeing pt in Oct with labs prior (I am coming back parttime then presumed) Her updated medication list for this problem includes:    Pravachol 40 Mg Tabs (Pravastatin sodium) ..... One tab by mouth at bedtime    Simvastatin 80 Mg Tabs (Simvastatin) ..... One tab by mouth at night  Labs Reviewed: SGOT: 19 (01/31/2010)   SGPT: 17 (01/31/2010)   HDL:47.30 (01/31/2010), 44.80 (01/28/2009)  LDL:DEL (  07/27/2008), DEL (01/06/2008)  Chol:233 (01/31/2010), 232 (01/28/2009)  Trig:126.0 (01/31/2010), 117.0 (01/28/2009)  Problem # 4:  HYPERTENSION (ICD-401.9) Assessment: Improved  Finally good control. Cont meds. Her updated medication list for this problem includes:    Metoprolol Tartrate 100 Mg Tabs (Metoprolol tartrate) .Marland Kitchen... Take 1 tablet by mouth twice a day    Lisinopril 20 Mg Tabs (Lisinopril) ..... One tab by mouth twice a day..     Amlodipine Besylate 2.5 Mg Tabs (Amlodipine besylate) ..... One tab by mouth at night  BP today: 128/72 Prior BP: 150/86 (08/23/2009)  Labs Reviewed: K+: 4.3 (01/31/2010) Creat: : 1.0 (01/31/2010)   Chol: 233 (01/31/2010)   HDL: 47.30 (01/31/2010)   LDL: DEL (07/27/2008)   TG: 126.0 (01/31/2010)  Orders: Prescription Created Electronically [phone])  Complete Medication List: 1)  Metoprolol Tartrate 100 Mg Tabs (Metoprolol tartrate) .... Take 1 tablet by mouth twice a day 2)  Pravachol 40 Mg Tabs (Pravastatin sodium) .... One tab by mouth at bedtime 3)  Lisinopril 20 Mg Tabs (Lisinopril) .... One tab by mouth twice a day.Marland Kitchen 4)  Amlodipine Besylate 2.5 Mg Tabs (Amlodipine besylate) .... One tab by mouth at night 5)  Simvastatin 80 Mg Tabs (Simvastatin) .... One tab by mouth at night  Patient Instructions: 1)  Call in late May for appt in Oct for followup with labs prior. 2)  Think about shingles shot. Prescriptions: LISINOPRIL 20 MG  TABS (LISINOPRIL) one tab by mouth twice a day.Marland Kitchen  #60 Tablet x 3   Entered and Authorized by:   Shaune Leeks MD   Signed by:   Shaune Leeks MD on 02/03/2010   Method used:   Electronically to        [facility]. [phone]* (retail)       [address]       [place], [place]  [address]       Ph: [phone]       Fax: [phone]   RxID:   [id number] METOPROLOL TARTRATE 100 MG TABS (METOPROLOL TARTRATE) Take 1 tablet by mouth twice a day  #60 x 11   Entered and Authorized by:   Shaune Leeks MD   Signed by:   Shaune Leeks MD on 02/03/2010   Method used:   Electronically to        [facility]. [phone]* (retail)       [address]       [place], [place]  [address]       Ph: [phone]       Fax: [phone]   RxID:   [id number] AMLODIPINE BESYLATE 2.5 MG TABS (AMLODIPINE BESYLATE) one tab by mouth at night  #30 x 12   Entered and  Authorized by:   Shaune Leeks MD   Signed by:   Shaune Leeks MD on 02/03/2010   Method used:   Electronically to        [facility]. [phone]* (retail)       [address]       [place], [place]  [address]       Ph: [phone]       Fax: [phone]   RxID:   [id number] SIMVASTATIN 80 MG TABS (SIMVASTATIN) one tab by mouth at night  #30 x 12   Entered and Authorized  by:   Shaune Leeks MD   Signed by:   Shaune Leeks MD on 02/03/2010   Method used:   Print then Give to Patient   RxID:   [id number]   Current Allergies (reviewed today): No known allergies

## 2010-11-09 NOTE — Progress Notes (Signed)
Summary: wants order for zostavax  Phone Note Call from Patient Call back at Home Phone [phone]   Caller: Patient Call For: Shaune Leeks MD Summary of Call: Pt requests order for zostavax. She says she had discussed this with you.  Please call when ready. Initial call taken by: Lowella Petties CMA,  Feb 07, 2010 8:32 AM  Follow-up for Phone Call        Pt to  pick up. Follow-up by: Lowella Petties CMA,  Feb 07, 2010 9:01 AM    New/Updated Medications: ZOSTAVAX [id number] UNT/0.65ML SOLR (ZOSTER VACCINE LIVE) administer 1 immun Prescriptions: ZOSTAVAX [id number] UNT/0.65ML SOLR (ZOSTER VACCINE LIVE) administer 1 immun  #1 x 0   Entered and Authorized by:   Shaune Leeks MD   Signed by:   Shaune Leeks MD on 02/07/2010   Method used:   Print then Give to Patient   RxID:   [id number]

## 2010-12-19 ENCOUNTER — Other Ambulatory Visit: Payer: Self-pay | Admitting: Family Medicine

## 2010-12-19 DIAGNOSIS — Z1231 Encounter for screening mammogram for malignant neoplasm of breast: Secondary | ICD-10-CM

## 2011-01-09 ENCOUNTER — Other Ambulatory Visit: Payer: Self-pay | Admitting: Family Medicine

## 2011-01-19 ENCOUNTER — Ambulatory Visit
Admission: RE | Admit: 2011-01-19 | Discharge: 2011-01-19 | Disposition: A | Discharge status: Home / Self Care | Payer: Medicare Other | Source: Ambulatory Visit | Attending: Family Medicine | Admitting: Family Medicine

## 2011-01-19 DIAGNOSIS — Z1231 Encounter for screening mammogram for malignant neoplasm of breast: Secondary | ICD-10-CM

## 2011-01-19 LAB — HM MAMMOGRAPHY: HM Mammogram: NORMAL

## 2011-01-24 ENCOUNTER — Encounter: Payer: Self-pay | Admitting: Family Medicine

## 2011-02-04 ENCOUNTER — Encounter: Payer: Self-pay | Admitting: Family Medicine

## 2011-02-05 ENCOUNTER — Other Ambulatory Visit: Payer: Self-pay | Admitting: Family Medicine

## 2011-02-16 ENCOUNTER — Other Ambulatory Visit: Payer: Self-pay | Admitting: Family Medicine

## 2011-02-16 DIAGNOSIS — R7989 Other specified abnormal findings of blood chemistry: Secondary | ICD-10-CM

## 2011-02-16 DIAGNOSIS — E785 Hyperlipidemia, unspecified: Secondary | ICD-10-CM

## 2011-02-16 DIAGNOSIS — T452X1A Poisoning by vitamins, accidental (unintentional), initial encounter: Secondary | ICD-10-CM

## 2011-02-16 DIAGNOSIS — I10 Essential (primary) hypertension: Secondary | ICD-10-CM

## 2011-02-22 ENCOUNTER — Other Ambulatory Visit (INDEPENDENT_AMBULATORY_CARE_PROVIDER_SITE_OTHER): Payer: Medicare Other | Admitting: Family Medicine

## 2011-02-22 DIAGNOSIS — R7989 Other specified abnormal findings of blood chemistry: Secondary | ICD-10-CM

## 2011-02-22 DIAGNOSIS — Z13220 Encounter for screening for lipoid disorders: Secondary | ICD-10-CM

## 2011-02-22 DIAGNOSIS — E785 Hyperlipidemia, unspecified: Secondary | ICD-10-CM

## 2011-02-22 DIAGNOSIS — I10 Essential (primary) hypertension: Secondary | ICD-10-CM

## 2011-02-22 DIAGNOSIS — T452X1A Poisoning by vitamins, accidental (unintentional), initial encounter: Secondary | ICD-10-CM

## 2011-02-22 LAB — RENAL FUNCTION PANEL
Albumin: 3.9 g/dL (ref 3.5–5.2)
CO2: 29 mEq/L (ref 19–32)
Calcium: 9.8 mg/dL (ref 8.4–10.5)
Chloride: 104 mEq/L (ref 96–112)
Glucose, Bld: 93 mg/dL (ref 70–99)
Potassium: 4.2 mEq/L (ref 3.5–5.1)
Sodium: 142 mEq/L (ref 135–145)

## 2011-02-22 LAB — MICROALBUMIN / CREATININE URINE RATIO
Creatinine,U: 192.1 mg/dL
Microalb Creat Ratio: 2.8 mg/g (ref 0.0–30.0)
Microalb, Ur: 5.4 mg/dL — ABNORMAL HIGH (ref 0.0–1.9)

## 2011-02-22 LAB — LDL CHOLESTEROL, DIRECT: Direct LDL: 169.5 mg/dL

## 2011-02-22 LAB — HEPATIC FUNCTION PANEL
Alkaline Phosphatase: 75 U/L (ref 39–117)
Bilirubin, Direct: 0.1 mg/dL (ref 0.0–0.3)
Total Bilirubin: 0.7 mg/dL (ref 0.3–1.2)
Total Protein: 6.9 g/dL (ref 6.0–8.3)

## 2011-02-22 LAB — LIPID PANEL: VLDL: 29.2 mg/dL (ref 0.0–40.0)

## 2011-02-23 LAB — VITAMIN D 25 HYDROXY (VIT D DEFICIENCY, FRACTURES): Vit D, 25-Hydroxy: 93 ng/mL — ABNORMAL HIGH (ref 30–89)

## 2011-03-01 ENCOUNTER — Encounter: Payer: Self-pay | Admitting: Family Medicine

## 2011-03-01 ENCOUNTER — Ambulatory Visit (INDEPENDENT_AMBULATORY_CARE_PROVIDER_SITE_OTHER): Payer: Medicare Other | Admitting: Family Medicine

## 2011-03-01 DIAGNOSIS — T452X1A Poisoning by vitamins, accidental (unintentional), initial encounter: Secondary | ICD-10-CM

## 2011-03-01 DIAGNOSIS — R7989 Other specified abnormal findings of blood chemistry: Secondary | ICD-10-CM

## 2011-03-01 DIAGNOSIS — Z0000 Encounter for general adult medical examination without abnormal findings: Secondary | ICD-10-CM

## 2011-03-01 DIAGNOSIS — E785 Hyperlipidemia, unspecified: Secondary | ICD-10-CM

## 2011-03-01 DIAGNOSIS — I10 Essential (primary) hypertension: Secondary | ICD-10-CM

## 2011-03-01 NOTE — Assessment & Plan Note (Signed)
LDL high, even on statin which she stopped one week ago and effectively LDL still reflective of being on the medication. She has stopped due to leg pain and will take as she feels she can tolerate

## 2011-03-01 NOTE — Assessment & Plan Note (Addendum)
Sugar well controlled. Nml this year. Good job.Jacqueline Cline

## 2011-03-01 NOTE — Assessment & Plan Note (Signed)
Still slightly elevated. On no supplement but gets in the tanning booth regularly, despite my exhortations not to do that. Will continue to follow.

## 2011-03-01 NOTE — Patient Instructions (Signed)
iFOB given, shots UTD. See me as needed. Take statin as able. Watch diet for fatty foods.

## 2011-03-01 NOTE — Assessment & Plan Note (Addendum)
Home assessment nml. Office assessment high. Just as it has been for many years. Will follow. Does not want further meds.

## 2011-03-01 NOTE — Progress Notes (Signed)
Subjective:    Patient ID: Jacqueline Cline, female    DOB: [birth year], 76 y.o.   MRN: [id number]  HPI Pt here for Comp Exam. She feels well. She had an incident earlier this month due to feeling like her legs were going to drop out from under her. The sensation quit when the medication was out of her system. She can not tolerate the medication. She is wondering about taking the medication twice a week.  Her BP at home (which she checks all the time multiple times daily) runs 110s/60s. She has significant  White coat syndrome.    Review of Systems  Constitutional: Negative for fever, chills, diaphoresis, fatigue and unexpected weight change.  HENT: Negative for hearing loss, ear pain, rhinorrhea, trouble swallowing and tinnitus.   Eyes: Negative for pain, discharge and visual disturbance.  Respiratory: Negative for cough, shortness of breath and wheezing.   Cardiovascular: Negative for chest pain, palpitations and leg swelling.       No Fainting or Fatigue.  Gastrointestinal: Negative for nausea, vomiting, abdominal pain, diarrhea, constipation and blood in stool.       No Heartburn  Genitourinary: Negative for dysuria and frequency.       No nocturia.  Musculoskeletal: Negative for myalgias, back pain and arthralgias.       Had leg weakness on the statin, now resolved.  Skin: Negative for rash.       No Itching or Dryness.  Neurological: Negative for tremors and numbness.       No Tingling. No Balance Problems.  Hematological: Negative for adenopathy. Does not bruise/bleed easily.  Psychiatric/Behavioral: Negative for dysphoric mood and agitation.       Objective:   Physical Exam  Constitutional: She is oriented to person, place, and time. She appears well-developed and well-nourished. No distress.  HENT:  Head: Normocephalic and atraumatic.  Right Ear: External ear normal.  Left Ear: External ear normal.  Nose: Nose normal.  Mouth/Throat: Oropharynx is clear and moist.  Eyes:  Conjunctivae and EOM are normal. Pupils are equal, round, and reactive to light. No scleral icterus.  Neck: Normal range of motion. Neck supple. No thyromegaly present.  Cardiovascular: Normal rate, regular rhythm, normal heart sounds and intact distal pulses.   No murmur heard. Pulmonary/Chest: Effort normal and breath sounds normal. No respiratory distress. She has no wheezes.  Abdominal: Soft. Bowel sounds are normal. She exhibits no mass. There is no tenderness.  Genitourinary: Vagina normal and uterus normal. Guaiac negative stool.       Introitus Nml Cervix Nml without tenderness Ovaries Not Felt   Musculoskeletal: Normal range of motion.       Back Nontender in the Midline  Lymphadenopathy:    She has no cervical adenopathy.  Neurological: She is alert and oriented to person, place, and time. She exhibits normal muscle tone. Coordination normal.  Skin: Skin is warm and dry. No rash noted. She is not diaphoretic.  Psychiatric: She has a normal mood and affect. Her behavior is normal. Judgment and thought content normal.          Assessment & Plan:  HMPE  I have personally reviewed the Medicare Annual Wellness questionnaire and have noted 1. The patient's medical and social history 2. Their use of alcohol, tobacco or illicit drugs 3. Their current medications and supplements 4. The patient's functional ability including ADL's, fall risks, home safety risks and hearing or visual  impairment. 5. Diet and physical activities 6. Evidence for depression or mood disorders

## 2011-03-02 ENCOUNTER — Telehealth: Payer: Self-pay | Admitting: *Deleted

## 2011-03-02 NOTE — Telephone Encounter (Signed)
Noted  

## 2011-03-02 NOTE — Telephone Encounter (Signed)
Pt came in and dropped off a note stating that she stopped simvastatin on May 10 because it made her legs feel weak, but she is going to start back on it.  Just an FYI.

## 2011-03-03 ENCOUNTER — Other Ambulatory Visit: Payer: Medicare Other

## 2011-03-07 ENCOUNTER — Other Ambulatory Visit (INDEPENDENT_AMBULATORY_CARE_PROVIDER_SITE_OTHER): Payer: Medicare Other | Admitting: Family Medicine

## 2011-03-07 DIAGNOSIS — Z1289 Encounter for screening for malignant neoplasm of other sites: Secondary | ICD-10-CM

## 2011-03-07 LAB — FECAL OCCULT BLOOD, IMMUNOCHEMICAL: Fecal Occult Bld: NEGATIVE

## 2011-03-09 ENCOUNTER — Encounter: Payer: Self-pay | Admitting: *Deleted

## 2011-04-01 ENCOUNTER — Other Ambulatory Visit: Payer: Self-pay | Admitting: Family Medicine

## 2011-06-30 ENCOUNTER — Other Ambulatory Visit: Payer: Self-pay | Admitting: Family Medicine

## 2011-11-09 ENCOUNTER — Encounter (INDEPENDENT_AMBULATORY_CARE_PROVIDER_SITE_OTHER): Payer: Medicare Other | Admitting: Ophthalmology

## 2011-11-09 DIAGNOSIS — H43819 Vitreous degeneration, unspecified eye: Secondary | ICD-10-CM

## 2011-11-09 DIAGNOSIS — H35359 Cystoid macular degeneration, unspecified eye: Secondary | ICD-10-CM

## 2011-11-09 DIAGNOSIS — H35039 Hypertensive retinopathy, unspecified eye: Secondary | ICD-10-CM

## 2011-11-09 DIAGNOSIS — I10 Essential (primary) hypertension: Secondary | ICD-10-CM

## 2011-12-21 ENCOUNTER — Other Ambulatory Visit: Payer: Self-pay | Admitting: Family Medicine

## 2011-12-21 DIAGNOSIS — Z1231 Encounter for screening mammogram for malignant neoplasm of breast: Secondary | ICD-10-CM

## 2011-12-25 ENCOUNTER — Encounter (INDEPENDENT_AMBULATORY_CARE_PROVIDER_SITE_OTHER): Payer: Medicare Other | Admitting: Ophthalmology

## 2011-12-25 DIAGNOSIS — H35359 Cystoid macular degeneration, unspecified eye: Secondary | ICD-10-CM

## 2011-12-25 DIAGNOSIS — H43819 Vitreous degeneration, unspecified eye: Secondary | ICD-10-CM

## 2011-12-25 DIAGNOSIS — I10 Essential (primary) hypertension: Secondary | ICD-10-CM

## 2011-12-25 DIAGNOSIS — H35039 Hypertensive retinopathy, unspecified eye: Secondary | ICD-10-CM

## 2012-01-22 ENCOUNTER — Ambulatory Visit
Admission: RE | Admit: 2012-01-22 | Discharge: 2012-01-22 | Disposition: A | Discharge status: Home / Self Care | Payer: Medicare Other | Source: Ambulatory Visit | Attending: Family Medicine | Admitting: Family Medicine

## 2012-01-22 DIAGNOSIS — Z1231 Encounter for screening mammogram for malignant neoplasm of breast: Secondary | ICD-10-CM

## 2012-01-23 ENCOUNTER — Encounter: Payer: Self-pay | Admitting: *Deleted

## 2012-02-02 ENCOUNTER — Other Ambulatory Visit: Payer: Self-pay | Admitting: Family Medicine

## 2012-02-28 ENCOUNTER — Other Ambulatory Visit: Payer: Self-pay | Admitting: Family Medicine

## 2012-02-28 DIAGNOSIS — I10 Essential (primary) hypertension: Secondary | ICD-10-CM

## 2012-02-28 DIAGNOSIS — E673 Hypervitaminosis D: Secondary | ICD-10-CM

## 2012-03-01 ENCOUNTER — Other Ambulatory Visit (INDEPENDENT_AMBULATORY_CARE_PROVIDER_SITE_OTHER): Payer: Medicare Other

## 2012-03-01 ENCOUNTER — Other Ambulatory Visit: Payer: Self-pay | Admitting: Family Medicine

## 2012-03-01 DIAGNOSIS — I10 Essential (primary) hypertension: Secondary | ICD-10-CM

## 2012-03-01 LAB — COMPREHENSIVE METABOLIC PANEL
AST: 22 U/L (ref 0–37)
Albumin: 3.7 g/dL (ref 3.5–5.2)
BUN: 24 mg/dL — ABNORMAL HIGH (ref 6–23)
CO2: 28 mEq/L (ref 19–32)
Calcium: 9.6 mg/dL (ref 8.4–10.5)
Chloride: 107 mEq/L (ref 96–112)
Creatinine, Ser: 0.9 mg/dL (ref 0.4–1.2)
GFR: 67.47 mL/min (ref >60.00)
Glucose, Bld: 94 mg/dL (ref 70–99)
Potassium: 4.5 mEq/L (ref 3.5–5.1)

## 2012-03-01 LAB — LIPID PANEL: Cholesterol: 258 mg/dL — ABNORMAL HIGH (ref 0–200)

## 2012-03-08 ENCOUNTER — Ambulatory Visit (INDEPENDENT_AMBULATORY_CARE_PROVIDER_SITE_OTHER): Payer: Medicare Other | Admitting: Family Medicine

## 2012-03-08 ENCOUNTER — Encounter: Payer: Self-pay | Admitting: Family Medicine

## 2012-03-08 VITALS — BP 156/80 | HR 52 | Temp 98.2°F | Ht 64.5 in | Wt 127.8 lb

## 2012-03-08 DIAGNOSIS — Z0000 Encounter for general adult medical examination without abnormal findings: Secondary | ICD-10-CM

## 2012-03-08 DIAGNOSIS — Z7189 Other specified counseling: Secondary | ICD-10-CM | POA: Insufficient documentation

## 2012-03-08 DIAGNOSIS — I10 Essential (primary) hypertension: Secondary | ICD-10-CM

## 2012-03-08 DIAGNOSIS — Z1211 Encounter for screening for malignant neoplasm of colon: Secondary | ICD-10-CM

## 2012-03-08 DIAGNOSIS — E785 Hyperlipidemia, unspecified: Secondary | ICD-10-CM

## 2012-03-08 MED ORDER — LISINOPRIL 20 MG PO TABS: 20.0000 mg | ORAL_TABLET | Freq: Two times a day (BID) | ORAL | Status: DC

## 2012-03-08 MED ORDER — AMLODIPINE BESYLATE 2.5 MG PO TABS: 2.5000 mg | ORAL_TABLET | Freq: Every day | ORAL | Status: DC

## 2012-03-08 MED ORDER — METOPROLOL TARTRATE 50 MG PO TABS: 50.0000 mg | ORAL_TABLET | Freq: Two times a day (BID) | ORAL | Status: DC

## 2012-03-08 MED ORDER — PRAVASTATIN SODIUM 40 MG PO TABS: 40.0000 mg | ORAL_TABLET | Freq: Every day | ORAL | Status: DC

## 2012-03-08 NOTE — Progress Notes (Signed)
I have personally reviewed the Medicare Annual Wellness questionnaire and have noted 1. The patient's medical and social history 2. Their use of alcohol, tobacco or illicit drugs 3. Their current medications and supplements 4. The patient's functional ability including ADL's, fall risks, home safety risks and hearing or visual             impairment. 5. Diet and physical activities 6. Evidence for depression or mood disorders  The patients weight, height, BMI have been recorded in the chart and visual acuity is per eye clinic.  I have made referrals, counseling and provided education to the patient based review of the above and I have provided the pt with a written personalized care plan for preventive services.  See scanned forms.  Routine anticipatory guidance given to patient.  Would want Delaney Meigs, her son, to make medical decisions should she be unable to do so.  D/w patient [id number] for colon cancer screening, including IFOB vs. colonoscopy.  Risks and benefits of both were discussed and patient voiced understanding.  Pt elects HYQ:MVHQ.  Flu 2012 Shingles 2012 PNA 2006 Tetanus 2005 Mammogram 3/13 DXA discussed, declined by patient  Hypertension:    Using medication without problems or lightheadedness: yes Chest pain with exertion:no Edema:no Short of breath:no Average SBPs have been 120 or lower with pulse <60 on home checks.  Elevated Cholesterol: Using medications without problems: no, unable to tolerate simvastatin.  Muscle aches: yes, on simvastatin.  Diet compliance:yes Exercise:yes  Meds, vitals, and allergies reviewed.   PMH and SH reviewed  ROS: See HPI.  Otherwise negative.    GEN: nad, alert and oriented HEENT: mucous membranes moist NECK: supple w/o LA CV: rrr, slightly brady PULM: ctab, no inc wob ABD: soft, +bs EXT: no edema SKIN: no acute rash

## 2012-03-08 NOTE — Assessment & Plan Note (Signed)
Dec BB due to pulse and low BP.  She agrees.  She had no sx of hypotension.

## 2012-03-08 NOTE — Patient Instructions (Addendum)
Go to the lab on the way out.  We'll contact you with your lab report. Cut back on the metoprolol to 50mg  twice a day.  Start taking pravastatin 40mg  a day for cholesterol.  If you have aches, then take 1/2 tab- 20mg - a day.  If you still have aches, then stop it.   Take care.

## 2012-03-08 NOTE — Assessment & Plan Note (Signed)
Change to pravastatin and see if she can tolerate that.  She agrees.  Labs d/w pt.

## 2012-03-19 ENCOUNTER — Other Ambulatory Visit: Payer: Medicare Other

## 2012-03-19 DIAGNOSIS — T452X1A Poisoning by vitamins, accidental (unintentional), initial encounter: Secondary | ICD-10-CM

## 2012-03-19 DIAGNOSIS — Z1211 Encounter for screening for malignant neoplasm of colon: Secondary | ICD-10-CM

## 2012-03-19 LAB — FECAL OCCULT BLOOD, IMMUNOCHEMICAL: Fecal Occult Bld: NEGATIVE

## 2012-04-08 ENCOUNTER — Ambulatory Visit (INDEPENDENT_AMBULATORY_CARE_PROVIDER_SITE_OTHER): Payer: Medicare Other | Admitting: Family Medicine

## 2012-04-08 ENCOUNTER — Encounter: Payer: Self-pay | Admitting: Family Medicine

## 2012-04-08 VITALS — BP 162/88 | HR 62 | Temp 97.9°F | Wt 126.0 lb

## 2012-04-08 DIAGNOSIS — H539 Unspecified visual disturbance: Secondary | ICD-10-CM

## 2012-04-08 DIAGNOSIS — R011 Cardiac murmur, unspecified: Secondary | ICD-10-CM

## 2012-04-08 MED ORDER — AMLODIPINE BESYLATE 2.5 MG PO TABS: 2.5000 mg | ORAL_TABLET | Freq: Every day | ORAL | Status: DC

## 2012-04-08 NOTE — Assessment & Plan Note (Signed)
Refer back to ophthalmology, had been seen at [facility] eye clinic.  I would like specialty input.  I don't know how much of this is related to glare from computer screen or from sun when driving.  She agrees with referral.

## 2012-04-08 NOTE — Assessment & Plan Note (Signed)
New murmur, refer for echo.  She may have AS that is contributing to occ orthostatic sx.  Continue to hold amlodipine for now and she'll call back with update on sx next week.  D/w pt.

## 2012-04-08 NOTE — Patient Instructions (Signed)
See Shirlee Limerick about your referral before you leave today (echo and [facility]). We'll contact you with your echo report. Take care.   Stay off the amlodipine in the meantime and give me an update on your symptoms next week.

## 2012-04-08 NOTE — Progress Notes (Signed)
She had cataract surgery in 2006.    When at work and shortly after leaving work (~3-4 PM), she can see well.  But as she gets closer to home her L eye vision gets blurry.  It will self resolve later in the day, soon after getting home.  She works on a Animator at work, most of the day.  This started in 12/12.  Had seen Dr. Ashley Royalty in 10/2011.  Was found to have "swelling the back of the left eye" per optho per patient report. No R eye symptoms.  She wanted a second opinion. No focal neuro sx o/w.   "Weak spell" Friday when she stood up.  No syncope.  The room didn't spin.  It self resolved.  It happened once again when she has been still for a long time last night and then stood up.  She had cut out the amlodipine for the last 2 days.  No CP, not SOB, no BLE edema  PMH and SH reviewed  ROS: See HPI, otherwise noncontributory.  Meds, vitals, and allergies reviewed.   nad ncat Tm wnl, nasal and OP wnl Neck supple, no LA rrr with SEM over the aortic area that radiates to the carotids B PERRL with EOMI, conjunctiva wnl B, limited fundus exam wnl B Ext w/o edema

## 2012-04-10 ENCOUNTER — Ambulatory Visit (HOSPITAL_COMMUNITY): Payer: Medicare Other | Attending: Cardiology | Admitting: Radiology

## 2012-04-10 DIAGNOSIS — Z87891 Personal history of nicotine dependence: Secondary | ICD-10-CM | POA: Insufficient documentation

## 2012-04-10 DIAGNOSIS — E785 Hyperlipidemia, unspecified: Secondary | ICD-10-CM | POA: Insufficient documentation

## 2012-04-10 DIAGNOSIS — I517 Cardiomegaly: Secondary | ICD-10-CM | POA: Insufficient documentation

## 2012-04-10 DIAGNOSIS — I10 Essential (primary) hypertension: Secondary | ICD-10-CM | POA: Insufficient documentation

## 2012-04-10 DIAGNOSIS — R011 Cardiac murmur, unspecified: Secondary | ICD-10-CM | POA: Insufficient documentation

## 2012-04-10 DIAGNOSIS — I059 Rheumatic mitral valve disease, unspecified: Secondary | ICD-10-CM | POA: Insufficient documentation

## 2012-04-10 DIAGNOSIS — I359 Nonrheumatic aortic valve disorder, unspecified: Secondary | ICD-10-CM | POA: Insufficient documentation

## 2012-04-10 NOTE — Progress Notes (Signed)
Echocardiogram performed.  

## 2012-04-11 ENCOUNTER — Encounter: Payer: Self-pay | Admitting: Family Medicine

## 2012-04-12 ENCOUNTER — Telehealth: Payer: Self-pay

## 2012-04-12 NOTE — Telephone Encounter (Signed)
.  left message to have patient return my call.  

## 2012-04-12 NOTE — Telephone Encounter (Signed)
I have called every number in patient's chart for her and her husband, with no answer, numbers disconnected or I just left a VM.

## 2012-04-12 NOTE — Telephone Encounter (Signed)
Left v/m requesting echo report. Left v/m for pt to call back.

## 2012-04-12 NOTE — Telephone Encounter (Signed)
Spoke with patient and advised results   

## 2012-04-15 ENCOUNTER — Telehealth: Payer: Self-pay

## 2012-04-15 NOTE — Telephone Encounter (Signed)
Noted  

## 2012-04-15 NOTE — Telephone Encounter (Signed)
Pt called with update; pt started taking amlodipine again on 04/13/12 because did not think amlodipine caused weakness. Pt wanted Dr Para March to know she is feeling better.

## 2012-07-04 ENCOUNTER — Encounter: Payer: Self-pay | Admitting: Family Medicine

## 2013-01-06 ENCOUNTER — Other Ambulatory Visit: Payer: Self-pay

## 2013-01-06 DIAGNOSIS — Z1231 Encounter for screening mammogram for malignant neoplasm of breast: Secondary | ICD-10-CM

## 2013-01-24 ENCOUNTER — Ambulatory Visit
Admission: RE | Admit: 2013-01-24 | Discharge: 2013-01-24 | Disposition: A | Discharge status: Home / Self Care | Payer: Medicare Other | Source: Ambulatory Visit

## 2013-01-24 DIAGNOSIS — Z1231 Encounter for screening mammogram for malignant neoplasm of breast: Secondary | ICD-10-CM

## 2013-01-30 ENCOUNTER — Encounter: Payer: Self-pay | Admitting: *Deleted

## 2013-02-25 ENCOUNTER — Other Ambulatory Visit: Payer: Self-pay | Admitting: Family Medicine

## 2013-02-25 DIAGNOSIS — E673 Hypervitaminosis D: Secondary | ICD-10-CM

## 2013-02-25 DIAGNOSIS — I10 Essential (primary) hypertension: Secondary | ICD-10-CM

## 2013-03-08 ENCOUNTER — Other Ambulatory Visit: Payer: Self-pay | Admitting: Family Medicine

## 2013-03-10 ENCOUNTER — Other Ambulatory Visit (INDEPENDENT_AMBULATORY_CARE_PROVIDER_SITE_OTHER): Payer: Medicare Other

## 2013-03-10 DIAGNOSIS — E673 Hypervitaminosis D: Secondary | ICD-10-CM

## 2013-03-10 DIAGNOSIS — I10 Essential (primary) hypertension: Secondary | ICD-10-CM

## 2013-03-10 LAB — COMPREHENSIVE METABOLIC PANEL
AST: 19 U/L (ref 0–37)
Albumin: 3.7 g/dL (ref 3.5–5.2)
Alkaline Phosphatase: 68 U/L (ref 39–117)
Glucose, Bld: 112 mg/dL — ABNORMAL HIGH (ref 70–99)
Potassium: 4.5 mEq/L (ref 3.5–5.1)
Sodium: 141 mEq/L (ref 135–145)
Total Bilirubin: 0.7 mg/dL (ref 0.3–1.2)
Total Protein: 7.3 g/dL (ref 6.0–8.3)

## 2013-03-10 LAB — LIPID PANEL
Cholesterol: 263 mg/dL — ABNORMAL HIGH (ref 0–200)
Total CHOL/HDL Ratio: 5
Triglycerides: 126 mg/dL (ref 0.0–149.0)
VLDL: 25.2 mg/dL (ref 0.0–40.0)

## 2013-03-11 LAB — VITAMIN D 25 HYDROXY (VIT D DEFICIENCY, FRACTURES): Vit D, 25-Hydroxy: 58 ng/mL (ref 30–89)

## 2013-03-17 ENCOUNTER — Encounter: Payer: Self-pay | Admitting: Family Medicine

## 2013-03-17 ENCOUNTER — Ambulatory Visit (INDEPENDENT_AMBULATORY_CARE_PROVIDER_SITE_OTHER): Payer: Medicare Other | Admitting: Family Medicine

## 2013-03-17 VITALS — BP 152/84 | HR 82 | Temp 98.1°F | Ht 64.5 in | Wt 126.8 lb

## 2013-03-17 DIAGNOSIS — Z0000 Encounter for general adult medical examination without abnormal findings: Secondary | ICD-10-CM

## 2013-03-17 DIAGNOSIS — R011 Cardiac murmur, unspecified: Secondary | ICD-10-CM

## 2013-03-17 DIAGNOSIS — Z1211 Encounter for screening for malignant neoplasm of colon: Secondary | ICD-10-CM

## 2013-03-17 DIAGNOSIS — I10 Essential (primary) hypertension: Secondary | ICD-10-CM

## 2013-03-17 DIAGNOSIS — E785 Hyperlipidemia, unspecified: Secondary | ICD-10-CM

## 2013-03-17 MED ORDER — PRAVASTATIN SODIUM 40 MG PO TABS: 20.0000 mg | ORAL_TABLET | Freq: Every day | ORAL | Status: DC

## 2013-03-17 MED ORDER — METOPROLOL TARTRATE 50 MG PO TABS: 50.0000 mg | ORAL_TABLET | Freq: Two times a day (BID) | ORAL | Status: DC

## 2013-03-17 MED ORDER — AMLODIPINE BESYLATE 2.5 MG PO TABS: 2.5000 mg | ORAL_TABLET | Freq: Every day | ORAL | Status: DC

## 2013-03-17 MED ORDER — LISINOPRIL 20 MG PO TABS: 20.0000 mg | ORAL_TABLET | Freq: Two times a day (BID) | ORAL | Status: DC

## 2013-03-17 NOTE — Patient Instructions (Signed)
Go to the lab on the way out.  We'll contact you with your lab report.  Don't change your meds.   Take care.  Glad to see you.

## 2013-03-17 NOTE — Assessment & Plan Note (Signed)
Mild AS noted on echo.  No sx, continue as is. Recheck clinically.

## 2013-03-17 NOTE — Assessment & Plan Note (Signed)
See scanned forms.  Routine anticipatory guidance given to patient.  See health maintenance. Flu 2013 Shingles 2012 PNA 2006 Tetanus2005 D/w patient [id number] for colon cancer screening, including IFOB vs. colonoscopy.  Risks and benefits of both were discussed and patient voiced understanding.  Pt elects WUJ:[id number].  Breast cancer screening up to date.  Advance directive.  Would want Jacqueline Cline, her son, to make medical decisions should she be unable to do so.  Cognitive function addressed- see scanned forms- and if abnormal then additional documentation follows.

## 2013-03-17 NOTE — Assessment & Plan Note (Signed)
Controlled on home checks.  Continue as is.

## 2013-03-17 NOTE — Progress Notes (Signed)
I have personally reviewed the Medicare Annual Wellness questionnaire and have noted 1. The patient's medical and social history 2. Their use of alcohol, tobacco or illicit drugs 3. Their current medications and supplements 4. The patient's functional ability including ADL's, fall risks, home safety risks and hearing or visual             impairment. 5. Diet and physical activities 6. Evidence for depression or mood disorders  The patients weight, height, BMI have been recorded in the chart and visual acuity is per eye clinic.  I have made referrals, counseling and provided education to the patient based review of the above and I have provided the pt with a written personalized care plan for preventive services.  See scanned forms.  Routine anticipatory guidance given to patient.  See health maintenance. Flu 2013 Shingles 2012 PNA 2006 Tetanus2005 D/w patient [id number] for colon cancer screening, including IFOB vs. colonoscopy.  Risks and benefits of both were discussed and patient voiced understanding.  Pt elects NFA:OZHY.  Breast cancer screening up to date.  Advance directive.  Would want Delaney Meigs, her son, to make medical decisions should she be unable to do so.  Cognitive function addressed- see scanned forms- and if abnormal then additional documentation follows.   Hypertension:    Using medication without problems or lightheadedness: yes Chest pain with exertion:no Edema:no Short of breath:no Average home BPs: 120s/60s at home.    H/o AS noted.  See above and exam below.   Mildly increased sugar noted.  D/w pt.  I question in this is accurate.  Diet and weight reviewed.   Elevated Cholesterol: Using medications without problems:yes, if taking ~3x per week Muscle aches: no Diet compliance:yes Exercise:yes  PMH and SH reviewed  Meds, vitals, and allergies reviewed.   ROS: See HPI.  Otherwise negative.    GEN: nad, alert and oriented HEENT: mucous  membranes moist NECK: supple w/o LA CV: rrr. Murmur noted- radiates to carotids.  PULM: ctab, no inc wob ABD: soft, +bs EXT: no edema SKIN: no acute rash

## 2013-03-17 NOTE — Assessment & Plan Note (Signed)
She'll try to titrate up the statin to the point that is tolerated.  Labs reviewed.

## 2013-03-20 ENCOUNTER — Other Ambulatory Visit (INDEPENDENT_AMBULATORY_CARE_PROVIDER_SITE_OTHER): Payer: Medicare Other

## 2013-03-20 DIAGNOSIS — Z1211 Encounter for screening for malignant neoplasm of colon: Secondary | ICD-10-CM

## 2013-03-21 ENCOUNTER — Encounter: Payer: Self-pay | Admitting: *Deleted

## 2013-04-10 ENCOUNTER — Encounter: Payer: Self-pay | Admitting: Radiology

## 2013-04-10 DIAGNOSIS — E673 Hypervitaminosis D: Secondary | ICD-10-CM | POA: Insufficient documentation

## 2013-12-17 ENCOUNTER — Telehealth: Payer: Self-pay | Admitting: Family Medicine

## 2013-12-17 NOTE — Telephone Encounter (Signed)
Patient Information:  Caller Name: Jacqueline Cline  Phone: [phone]  Patient: Jacqueline Cline, Jacqueline Cline  Gender: Female  DOB: 11/23/1934  Age: 79 Years  PCP: Jacqueline Cline, Jacqueline Cline(Jacqueline Cline) Jacqueline Cline(Family Practice)  Office Follow Up:  Does the office need to follow up with this patient?: Yes  Instructions For The Office: PLEASE FOLLOW UP WITH RECOMMENDATIONS ON EVALUATION OR ACTIONS NEEDED AT THIS TIME. THANK YOU.   Symptoms  Reason For Call & Symptoms: Patient's son calls to report a series of recent events including patient tripping last weekend hitting a wall though not falling or causing any injuries. Patient has also made recent complaints of not feeling stable on her feet. Initially, family members thought there may be changes in vision that were causing the unsteadiness. Patient has undergone eye evaluation with most recent evaluation today, 3/11, confirming no detectable changes in vision. Family members now question if amlodipine is causing dizziness or unsteadiness on feet. Dizziness also has been accompanied by a dull frontal headache.  Reviewed Health History In EMR: Yes  Reviewed Medications In EMR: Yes  Reviewed Allergies In EMR: Yes  Reviewed Cline / Procedures: Yes  Date of Onset of Symptoms: Unknown  Guideline(s) Used:  Dizziness  Disposition Per Guideline:   Discuss with PCP and Callback by Nurse Today  Reason For Disposition Reached:   Taking a medicine that could cause dizziness (e.g., blood pressure medications, diuretics)  Advice Given:  N/A  Patient Will Follow Care Advice:  YES

## 2013-12-17 NOTE — Telephone Encounter (Signed)
Patient's son [name] notified as instructed by telephone. Appointment scheduled for Friday. Patient has another doctor's appointment already scheduled for tomorrow (Thursday).

## 2013-12-17 NOTE — Telephone Encounter (Signed)
I would have her check her BP at home if possible and get scheduled here in the clinic soon for a check.  Thanks.

## 2013-12-19 ENCOUNTER — Ambulatory Visit (INDEPENDENT_AMBULATORY_CARE_PROVIDER_SITE_OTHER): Payer: Medicare Other | Admitting: Family Medicine

## 2013-12-19 ENCOUNTER — Encounter: Payer: Self-pay | Admitting: Family Medicine

## 2013-12-19 VITALS — BP 142/80 | HR 65 | Temp 98.0°F | Wt 122.0 lb

## 2013-12-19 DIAGNOSIS — I10 Essential (primary) hypertension: Secondary | ICD-10-CM

## 2013-12-19 NOTE — Progress Notes (Signed)
Pre visit review using our clinic review tool, if applicable. No additional management support is needed unless otherwise documented below in the visit note.  She had noted weakness, diffusely.  Had fallen once.  Had her eyes checked, no cause seen.  Would feel dizzy on standing.  Stopped amlodipine 4 days ago.  She feels some better in the meantime.  She is down a few pounds in the meantime.  BP had been okay at last OV with me.   Meds, vitals, and allergies reviewed.   ROS: See HPI.  Otherwise, noncontributory.  GEN: nad, alert and oriented HEENT: mucous membranes moist NECK: supple w/o LA CV: rrr, murmur noted  PULM: ctab, no inc wob ABD: soft, +bs EXT: no edema SKIN: no acute rash CN 2-12 wnl B, S/S/DTR wnl x4 except for dec in ROM L arm at baseline

## 2013-12-19 NOTE — Patient Instructions (Signed)
Stay off the amlodipine for now.  Don't change your other meds.  Update me next week with your pressure.   We may need to let your pressure run a little high (140-150s/80-90s).

## 2013-12-20 NOTE — Assessment & Plan Note (Addendum)
Likely overtreated HTN, now improved off CCB.  Would continue as is and have her update me next week. No sign of ominous sx.  dw pt. She agrees with plan. Unlikely to be related to murmur as exam isn't changed, no rales and no BLE edema.  She may need a slightly higher BP goal, depending on sx.

## 2013-12-22 ENCOUNTER — Telehealth: Payer: Self-pay | Admitting: Family Medicine

## 2013-12-22 ENCOUNTER — Telehealth: Payer: Self-pay

## 2013-12-22 MED ORDER — AMLODIPINE BESYLATE 2.5 MG PO TABS: 2.5000 mg | ORAL_TABLET | Freq: Every day | ORAL | Status: DC

## 2013-12-22 NOTE — Telephone Encounter (Signed)
Patient advised. Appointment scheduled.  

## 2013-12-22 NOTE — Telephone Encounter (Signed)
Patient states that she does not want to start the Amlodipine again because it made her so unsteady on her feet.  I explained that her son said she had actually gotten worse since she stopped the medication.  She agreed and said she had gotten worse because she fell.  I told her that since her unsteadiness because worse off the medication, that is why Dr. Para Marchuncan wants to restart it.  She still was not agreeable and I'm not sure I was really able to make her understand.

## 2013-12-22 NOTE — Telephone Encounter (Signed)
She needs to restart the amlodipine at her prev dose and if CP/SOB/HA then to ER.  I want her to check back with the eye clinic in the meantime.  She needs recheck here in the clinic in 1-2 days.  Bring her BP cuff to the visit.  Out of work today and tomorrow- please give her a note for work.  Thanks.

## 2013-12-22 NOTE — Telephone Encounter (Signed)
Relevant patient education mailed to patient.  

## 2013-12-22 NOTE — Telephone Encounter (Signed)
Pts son called and pt reported to son that since stopping amlodipine on 12/19/13 blurred vision has worsened; Mr Coralee NorthClapp asked me to call pt. I spoke with pt and she said blurred vision has worsened since 12/19/13; pt has H/A on and off, no H/A now and no dizziness. No CP or SOB. Pt said blurred vision has bothered her since changed glasses last June 2014. Now Pts BP is 213/64 P 69. [facility]. Pt said if Dr Para Marchuncan does not think pt should go to work pt will need a note to be out of work.

## 2013-12-23 ENCOUNTER — Ambulatory Visit (INDEPENDENT_AMBULATORY_CARE_PROVIDER_SITE_OTHER): Payer: Medicare Other | Admitting: Family Medicine

## 2013-12-23 ENCOUNTER — Encounter: Payer: Self-pay | Admitting: Family Medicine

## 2013-12-23 VITALS — BP 182/80 | HR 69 | Temp 98.2°F | Wt 122.2 lb

## 2013-12-23 DIAGNOSIS — I359 Nonrheumatic aortic valve disorder, unspecified: Secondary | ICD-10-CM

## 2013-12-23 DIAGNOSIS — I350 Nonrheumatic aortic (valve) stenosis: Secondary | ICD-10-CM

## 2013-12-23 DIAGNOSIS — I10 Essential (primary) hypertension: Secondary | ICD-10-CM

## 2013-12-23 MED ORDER — METOPROLOL TARTRATE 50 MG PO TABS: 75.0000 mg | ORAL_TABLET | Freq: Two times a day (BID) | ORAL | Status: DC

## 2013-12-23 NOTE — Patient Instructions (Addendum)
Stop the amlodipine.  Go to the lab on the way out.  We'll contact you with your lab report. Take 1.5 tabs of metoprolol twice a day.  Well be in touch in the meantime.  Check your BP and pulse at home in the meantime.   Take care.

## 2013-12-23 NOTE — Progress Notes (Signed)
Pre visit review using our clinic review tool, if applicable. No additional management support is needed unless otherwise documented below in the visit note.  To recap recent events:  She had noted weakness, diffusely. Had fallen once. Had her eyes checked, no cause seen. Would feel dizzy on standing. Stopped amlodipine and she felt some better in the meantime.  BP had been okay at OV last summer with me.   Since stopping amlodipine, BP diffusely up.  No HA, CP, SOB, BLE edema now.  Took 1 dose night to get BP down some.  Asking for options today.   Meds, vitals, and allergies reviewed.   ROS: See HPI.  Otherwise, noncontributory.  GEN: nad, alert and oriented HEENT: mucous membranes moist NECK: supple w/o LA CV: rrr.  SEM noted.  PULM: ctab, no inc wob ABD: soft, +bs EXT: no edema SKIN: no acute rash

## 2013-12-24 LAB — BASIC METABOLIC PANEL
BUN: 17 mg/dL (ref 6–23)
CHLORIDE: 102 meq/L (ref 96–112)
CO2: 29 mEq/L (ref 19–32)
Calcium: 10.1 mg/dL (ref 8.4–10.5)
Creatinine, Ser: 1 mg/dL (ref 0.4–1.2)
GFR: 60.66 mL/min (ref >60.00)
Glucose, Bld: 99 mg/dL (ref 70–99)
POTASSIUM: 4.3 meq/L (ref 3.5–5.1)
SODIUM: 138 meq/L (ref 135–145)

## 2013-12-24 NOTE — Assessment & Plan Note (Addendum)
Would recheck echo given the wide pulse pressure, though I think this is from lack of compliance in the peripheral arteries, no solely from the AS.  She feels better off amlodipine but SBP up- would inc BB for now, check BMET and then consider inc in ACE.  She'll report back with BP and pulse readings.   Okay for outpatient f/u. She has no focal neuro changes.  Okay to return to work assuming she feels up to it.

## 2013-12-26 ENCOUNTER — Telehealth: Payer: Self-pay

## 2013-12-26 ENCOUNTER — Telehealth: Payer: Self-pay | Admitting: *Deleted

## 2013-12-26 DIAGNOSIS — H547 Unspecified visual loss: Secondary | ICD-10-CM

## 2013-12-26 NOTE — Telephone Encounter (Signed)
[name] pts son faxed note to Dr Para Marchuncan; [facility] removed 2 cataracts approx 7 years ago and removed a film over one eye in 2014. [name] also suggest contacting Dr Alan MulderJohn Matthews office for additional medical records; pt was treated by Dr Ashley RoyaltyMatthews for swelling behind eye. Dr Ashley RoyaltyMatthews phone # [phone] and fax [phone]. [name] said could contact him after Dr Para Marchuncan reviews information. Fax placed in Dr Lianne Bushyuncan's in box.

## 2013-12-26 NOTE — Telephone Encounter (Signed)
Patient states that earlier this morning, she had a spell of her legs becoming weak and she couldn't see.  I asked if she passed out and she stated she did not pass out but couldn't see for about 20 minutes.  I asked if she layed down or sat down and she said "no, I just kept moving and it finally passed".  She says her BP is 141/56.  She has an echocardiogram scheduled for April 3 but wonders if she could possibly get it done sooner with this latest episode?  Please advise.

## 2013-12-26 NOTE — Addendum Note (Signed)
Addended by: Joaquim NamUNCAN, Colm Lyford S on: 12/26/2013 03:09 PM   Modules accepted: Orders

## 2013-12-26 NOTE — Telephone Encounter (Signed)
Orders are in.  Shirlee LimerickMarion- please see if these can be done sooner. Thanks.

## 2013-12-26 NOTE — Telephone Encounter (Signed)
Patient advised.  No carotid US or CT head was done by eye clinic.  Please order and see if echo can be sooner.

## 2013-12-26 NOTE — Telephone Encounter (Signed)
Yes, should be sooner.  I was trying to review the records from the eye clinic and I didn't seen records of carotid u/s or CT head.  Please verify with patient that those have been done. If not, we need to order- let me know. If no residual sx, then I wouldn't change anything at this point.  If any other sx remaining now or if sx return, then to ER.  Thanks.

## 2013-12-26 NOTE — Telephone Encounter (Signed)
Carotid US on Mon-12/29/13 at 3:15pm. CT Head and 2DEcho on 12/30/13 at 2:30pm.

## 2013-12-28 NOTE — Telephone Encounter (Signed)
Please see if you can get any records faxed over from [facility]/Dr. Ashley RoyaltyMatthews.  Thanks.

## 2013-12-29 ENCOUNTER — Encounter: Payer: Self-pay | Admitting: *Deleted

## 2013-12-29 ENCOUNTER — Ambulatory Visit (HOSPITAL_COMMUNITY): Payer: Medicare Other | Attending: Family Medicine | Admitting: Cardiology

## 2013-12-29 ENCOUNTER — Telehealth: Payer: Self-pay

## 2013-12-29 DIAGNOSIS — I6529 Occlusion and stenosis of unspecified carotid artery: Secondary | ICD-10-CM

## 2013-12-29 DIAGNOSIS — I779 Disorder of arteries and arterioles, unspecified: Secondary | ICD-10-CM

## 2013-12-29 DIAGNOSIS — I739 Peripheral vascular disease, unspecified: Principal | ICD-10-CM

## 2013-12-29 DIAGNOSIS — H53129 Transient visual loss, unspecified eye: Secondary | ICD-10-CM

## 2013-12-29 DIAGNOSIS — H547 Unspecified visual loss: Secondary | ICD-10-CM | POA: Insufficient documentation

## 2013-12-29 NOTE — Progress Notes (Signed)
Carotid duplex complete 

## 2013-12-29 NOTE — Telephone Encounter (Signed)
Request faxed to [facility] [phone]

## 2013-12-29 NOTE — Telephone Encounter (Signed)
Ria Commentina [facility] said need to speak with physician before releasing pt ; severe Rt ICA and occluded left ICA; Dr Para Marchuncan speaking with Inetta Fermoina.

## 2013-12-29 NOTE — Telephone Encounter (Signed)
D/w pt, her son and Dr. Myra GianottiBrabham.   Would still get CT head and echo tomorrow.  No new/current neuro sx.  If any changes, to ER.  Will arrange for [facility] eval this week.   Shirlee LimerickMarion- please notify pt's son about appointment.  Thanks to all involved.

## 2013-12-30 ENCOUNTER — Ambulatory Visit (INDEPENDENT_AMBULATORY_CARE_PROVIDER_SITE_OTHER)
Admission: RE | Admit: 2013-12-30 | Discharge: 2013-12-30 | Disposition: A | Discharge status: Home / Self Care | Payer: Medicare Other | Source: Ambulatory Visit | Attending: Family Medicine | Admitting: Family Medicine

## 2013-12-30 ENCOUNTER — Encounter: Payer: Self-pay | Admitting: Family Medicine

## 2013-12-30 ENCOUNTER — Encounter: Payer: Self-pay | Admitting: Cardiology

## 2013-12-30 ENCOUNTER — Ambulatory Visit (HOSPITAL_COMMUNITY): Payer: Medicare Other | Attending: Cardiology | Admitting: Radiology

## 2013-12-30 DIAGNOSIS — I639 Cerebral infarction, unspecified: Secondary | ICD-10-CM | POA: Insufficient documentation

## 2013-12-30 DIAGNOSIS — I350 Nonrheumatic aortic (valve) stenosis: Secondary | ICD-10-CM

## 2013-12-30 DIAGNOSIS — I359 Nonrheumatic aortic valve disorder, unspecified: Secondary | ICD-10-CM

## 2013-12-30 DIAGNOSIS — H547 Unspecified visual loss: Secondary | ICD-10-CM

## 2013-12-30 NOTE — Progress Notes (Signed)
Echocardiogram Performed. 

## 2013-12-31 ENCOUNTER — Ambulatory Visit (INDEPENDENT_AMBULATORY_CARE_PROVIDER_SITE_OTHER): Payer: Medicare Other | Admitting: Vascular Surgery

## 2013-12-31 ENCOUNTER — Encounter (HOSPITAL_COMMUNITY): Payer: Self-pay | Admitting: Pharmacy Technician

## 2013-12-31 ENCOUNTER — Encounter: Payer: Self-pay | Admitting: Vascular Surgery

## 2013-12-31 ENCOUNTER — Other Ambulatory Visit: Payer: Self-pay | Admitting: *Deleted

## 2013-12-31 ENCOUNTER — Encounter: Payer: Self-pay | Admitting: *Deleted

## 2013-12-31 VITALS — BP 144/66 | HR 56 | Ht 64.5 in | Wt 122.8 lb

## 2013-12-31 DIAGNOSIS — I6529 Occlusion and stenosis of unspecified carotid artery: Secondary | ICD-10-CM

## 2013-12-31 NOTE — Assessment & Plan Note (Signed)
This patient has an occluded left internal carotid artery with a very tight right internal carotid artery stenosis. The middle internal carotid artery has a peak systolic velocity of 743 cm/s with end-diastolic velocity 150 cm/s. He is having problems with unsteady gait and dizziness which could be related to cerebral hypo-profusion based on her severe carotid disease. I have recommended right carotid endarterectomy which should help with her dizziness and also lower her risk of future stroke. I have reviewed the indications for carotid endarterectomy, that is to lower the risk of future stroke. I have also reviewed the potential complications of surgery, including but not limited to: bleeding, stroke (perioperative risk 1-2%), MI, nerve injury of other unpredictable medical problems. All of the patients questions were answered and they are agreeable to proceed with surgery.  Her surgery is scheduled for 01/06/2014. I have instructed her to begin taking aspirin. Unfortunately she does not take her statin consistently because she does not tolerate this.

## 2013-12-31 NOTE — Progress Notes (Signed)
Patient ID: Jacqueline Cline, female   DOB: 1935-04-29, 79 y.o.   MRN: [id number]  Reason for Consult: Carotid   Referred by Joaquim Nam, MD  Subjective:     HPI:    Jacqueline Cline is a 79 y.o. female who has had some problems with unsteady gait and dizziness recently. This prompted a carotid duplex scan that was done at [facility] which showed an occlusion of the left internal carotid artery and a tight stenosis on the right. The patient was sent for vascular consultation today. She is right-handed. She denies any history of stroke, TIAs, expressive or receptive aphasia. However, she states that something occurred at birth and she is always had some weakness in the left upper extremity and left lower extremity.  She is currently on Pravachol, however, she does not take this routinely because she has problems with muscle pain when she takes her statin. She is not on aspirin per  Her risk factors for peripheral vascular disease include hypertension, hypercholesterolemia, and a history of premature cardiovascular disease. She denies any history of diabetes. She has a remote history of tobacco use.  Past Medical History  Diagnosis Date  . Hyperlipidemia 12/07/2004  . Hypertension 03/09/2004  . Carotid artery occlusion    Family History  Problem Relation Age of Onset  . Heart disease Mother 36    CABG, POSTOP  . Heart disease Father 53    MI : Depression  . Heart attack Father   . Hypertension Sister     hyperthyroid- Removed // Decreased hearing  . Cancer Paternal Uncle     unknown type  . Stroke Maternal Grandmother   . Diabetes Neg Hx   . Alcohol abuse Neg Hx   . Colon cancer Neg Hx   . Breast cancer Neg Hx    Past Surgical History  Procedure Laterality Date  . Eye surgery      cataract removed OD 01/05/2005//cataract Removed OS 03/14/2005  . Orif ankle fracture  1995    Right ankle fracture  . Normal vaginal delivery  1962   Short Social History:  History    Substance Use Topics  . Smoking status: Former Smoker    Types: Cigarettes  . Smokeless tobacco: Former Neurosurgeon    Quit date: 01/01/1964     Comment: quit over 50 years  . Alcohol Use: No   Allergies  Allergen Reactions  . Simvastatin     myalgias   Current Outpatient Prescriptions  Medication Sig Dispense Refill  . Ascorbic Acid (VITAMIN C) 1000 MG tablet Take 1,000 mg by mouth daily.        . Beta Carotene (VITAMIN A) 25000 UNIT capsule Take 25,000 Units by mouth daily.      . cholecalciferol (VITAMIN D) 1000 UNITS tablet Take 1,000 Units by mouth daily.      Jacqueline Cline CAPS Take by mouth.      . Coenzyme Q10 (CO Q 10) 100 MG CAPS Take by mouth 2 (two) times daily.        . cycloSPORINE (RESTASIS) 0.05 % ophthalmic emulsion 1 drop 2 (two) times daily.      Jacqueline Kitchen lisinopril (PRINIVIL,ZESTRIL) 20 MG tablet Take 1 tablet (20 mg total) by mouth 2 (two) times daily.  180 tablet  3  . metoprolol (LOPRESSOR) 50 MG tablet Take 1.5 tablets (75 mg total) by mouth 2 (two) times daily.      . Misc Natural Products (CALCIUM PLUS ADVANCED  PO) Take by mouth 2 (two) times daily.        Bertram Gala. Polyethyl Glycol-Propyl Glycol (SYSTANE) 0.4-0.3 % GEL Apply to eye.      . pravastatin (PRAVACHOL) 40 MG tablet Take 0.5 tablets (20 mg total) by mouth daily. As tolerated      . vitamin E 400 UNIT capsule Take 400 Units by mouth daily.       No current facility-administered medications for this visit.   Review of Systems  Constitutional: Negative for chills and fever.  Eyes: Negative for loss of vision.  Respiratory: Negative for cough and wheezing.  Cardiovascular: Negative for chest pain, chest tightness, claudication, dyspnea with exertion, orthopnea and palpitations.  GI: Negative for blood in stool and vomiting.  GU: Negative for dysuria and hematuria.  Musculoskeletal: Negative for leg pain, joint pain and myalgias.  Skin: Negative for rash and wound.  Neurological: Positive for dizziness and focal  weakness. Negative for numbness and speech difficulty.  Hematologic: Negative for bruises/bleeds easily. Psychiatric: Negative for depressed mood.       Objective:  Objective  Filed Vitals:   12/31/13 0939 12/31/13 0942  BP: 158/65 144/66  Pulse: 56   Height: 5' 4.5" (1.638 m)   Weight: 122 lb 12.8 oz (55.702 kg)   SpO2: 98%    Body mass index is 20.76 kg/(m^2).  Physical Exam  Constitutional: She is oriented to person, place, and time. She appears well-developed and well-nourished.  HENT:  Head: Normocephalic and atraumatic.  Neck: Neck supple. No JVD present. No thyromegaly present.  Cardiovascular: Normal rate, regular rhythm and normal heart sounds.  Exam reveals no friction rub.   No murmur heard. Pulses:      Carotid pulses are on the right side with bruit.      Radial pulses are 2+ on the right side, and 2+ on the left side.       Femoral pulses are 2+ on the right side, and 2+ on the left side. Pulmonary/Chest: Breath sounds normal. She has no wheezes. She has no rales.  Abdominal: Soft. Bowel sounds are normal. There is no tenderness.  Musculoskeletal: She exhibits no edema.  Lymphadenopathy:    She has no cervical adenopathy.  Neurological: She is alert and oriented to person, place, and time.  Reflex Scores:      Tricep reflexes are 3+ on the left side.      Bicep reflexes are 3+ on the left side.      Achilles reflexes are 3+ on the left side. Skin: No rash noted.  Psychiatric: She has a normal mood and affect.   DATA: CAROTID DUPLEX: I reviewed her carotid duplex scan which shows a greater than 80% right carotid stenosis with occluded left internal carotid artery. Both vertebral arteries are patent with antegrade flow.  CT of the head on 12/30/2013 shows chronic right middle cerebral artery territory ischemia with no acute intracranial abnormality. There is extensive calcified atherosclerosis of the internal carotid artery siphons.      Assessment/Plan:      Occlusion and stenosis of carotid artery without mention of cerebral infarction This patient has an occluded left internal carotid artery with a very tight right internal carotid artery stenosis. The middle internal carotid artery has a peak systolic velocity of 743 cm/s with end-diastolic velocity 150 cm/s. He is having problems with unsteady gait and dizziness which could be related to cerebral hypo-profusion based on her severe carotid disease. I have recommended right carotid endarterectomy which  should help with her dizziness and also lower her risk of future stroke. I have reviewed the indications for carotid endarterectomy, that is to lower the risk of future stroke. I have also reviewed the potential complications of surgery, including but not limited to: bleeding, stroke (perioperative risk 1-2%), MI, nerve injury of other unpredictable medical problems. All of the patients questions were answered and they are agreeable to proceed with surgery.  Her surgery is scheduled for 01/06/2014. I have instructed her to begin taking aspirin. Unfortunately she does not take her statin consistently because she does not tolerate this.    I have discussed with the patient the nature of atherosclerosis and emphasized the importance of maximal medical management including control of blood pressure, blood glucose and cholesterol levels, antiplatelet agents, obtaining regular exercise, and cessation of smoking. The patient is aware that without maximal medical management the underlying atherosclerotic disease process with progress and also limit the benefit of any interventions.   For VQI Use Only   PRE-ADM LIVING: [facility] ] Home, [ ]  Nursing home, [ ]  Homeless  AMB STATUS: [facility] ] Walking, [ ]  Walking w/ Assistance, [ ]  Wheelchair, [ ] Bed ridden  RECENT HEART ATTACK (<6 mon): No  CAD Sx: [facility] ] No, [ ]  Asx, h/o MI, [ ]  Stable angina, [ ]  Unstable angina  PRIOR CHF: [facility] ] No, [ ]  Asx, [ ]  Mild, [ ]  Moderate, [ ]   Severe  STRESS TEST: [facility] ] No, [ ]  Normal, [ ]  + ischemia, [ ]  + MI, [ ]  Both  ASA: Was not on aspirin. Started TODAY.    Statin: The patient is on protocol. However she takes this intermittently because of muscle pain.    Chuck Hint MD [facility] #

## 2014-01-01 NOTE — Pre-Procedure Instructions (Addendum)
Jacqueline Cline  01/01/2014   Your procedure is scheduled on:  Tues, Mar 31   Report to [facility] Entrance A  at 5:30 AM.  Call this number if you have problems the morning of surgery: [phone]   Remember:   Do not eat food or drink liquids after midnight.   Take these medicines the morning of surgery with A SIP OF WATER: Metoprolol(Lopressor)             Stop taking your Vitamins, Cod Liver Oil,COQ10,and Vit E. No Goody's,BC's,Aleve,Ibuprofen,or any Herbal Medications   Do not wear jewelry, make-up or nail polish.  Do not wear lotions, powders, or perfumes. You may wear deodorant.  Do not shave 48 hours prior to surgery.   Do not bring valuables to the hospital.  [facility] is not responsible                  for any belongings or valuables.               Contacts, dentures or bridgework may not be worn into surgery.  Leave suitcase in the car. After surgery it may be brought to your room.  For patients admitted to the hospital, discharge time is determined by your                treatment team.               Patients discharged the day of surgery will not be allowed to drive  home.    Special Instructions:  [place] - Preparing for Surgery  Before surgery, you can play an important role.  Because skin is not sterile, your skin needs to be as free of germs as possible.  You can reduce the number of germs on you skin by washing with CHG (chlorahexidine gluconate) soap before surgery.  CHG is an antiseptic cleaner which kills germs and bonds with the skin to continue killing germs even after washing.  Please DO NOT use if you have an allergy to CHG or antibacterial soaps.  If your skin becomes reddened/irritated stop using the CHG and inform your nurse when you arrive at Short Stay.  Do not shave (including legs and underarms) for at least 48 hours prior to the first CHG shower.  You may shave your face.  Please follow these instructions carefully:   1.  Shower with CHG  Soap the night before surgery and the                                morning of Surgery.  2.  If you choose to wash your hair, wash your hair first as usual with your       normal shampoo.  3.  After you shampoo, rinse your hair and body thoroughly to remove the                      Shampoo.  4.  Use CHG as you would any other liquid soap.  You can apply chg directly       to the skin and wash gently with scrungie or a clean washcloth.  5.  Apply the CHG Soap to your body ONLY FROM THE NECK DOWN.        Do not use on open wounds or open sores.  Avoid contact with your eyes,       ears, mouth and  genitals (private parts).  Wash genitals (private parts)       with your normal soap.  6.  Wash thoroughly, paying special attention to the area where your surgery        will be performed.  7.  Thoroughly rinse your body with warm water from the neck down.  8.  DO NOT shower/wash with your normal soap after using and rinsing off       the CHG Soap.  9.  Pat yourself dry with a clean towel.            10.  Wear clean pajamas.            11.  Place clean sheets on your bed the night of your first shower and do not        sleep with pets.  Day of Surgery  Do not apply any lotions/deoderants the morning of surgery.  Please wear clean clothes to the hospital/surgery center.     Please read over the following fact sheets that you were given: Pain Booklet, Coughing and Deep Breathing, Blood Transfusion Information, MRSA Information and Surgical Site Infection Prevention

## 2014-01-02 ENCOUNTER — Encounter (HOSPITAL_COMMUNITY)
Admission: RE | Admit: 2014-01-02 | Discharge: 2014-01-02 | Disposition: A | Discharge status: Home / Self Care | Payer: Medicare Other | Source: Ambulatory Visit | Attending: Vascular Surgery | Admitting: Vascular Surgery

## 2014-01-02 ENCOUNTER — Ambulatory Visit (HOSPITAL_COMMUNITY)
Admission: RE | Admit: 2014-01-02 | Discharge: 2014-01-02 | Disposition: A | Discharge status: Home / Self Care | Payer: Medicare Other | Source: Ambulatory Visit | Attending: Anesthesiology | Admitting: Anesthesiology

## 2014-01-02 ENCOUNTER — Encounter (HOSPITAL_COMMUNITY): Payer: Self-pay

## 2014-01-02 DIAGNOSIS — Z01812 Encounter for preprocedural laboratory examination: Secondary | ICD-10-CM | POA: Insufficient documentation

## 2014-01-02 DIAGNOSIS — I10 Essential (primary) hypertension: Secondary | ICD-10-CM | POA: Insufficient documentation

## 2014-01-02 DIAGNOSIS — Z01810 Encounter for preprocedural cardiovascular examination: Secondary | ICD-10-CM | POA: Insufficient documentation

## 2014-01-02 DIAGNOSIS — Z01818 Encounter for other preprocedural examination: Secondary | ICD-10-CM | POA: Insufficient documentation

## 2014-01-02 HISTORY — DX: Unspecified osteoarthritis, unspecified site: M19.90

## 2014-01-02 HISTORY — DX: Cardiac murmur, unspecified: R01.1

## 2014-01-02 HISTORY — DX: Pneumonia, unspecified organism: J18.9

## 2014-01-02 LAB — TYPE AND SCREEN
ABO/RH(D): O POS
Antibody Screen: NEGATIVE

## 2014-01-02 LAB — COMPREHENSIVE METABOLIC PANEL
ALT: 13 U/L (ref 0–35)
AST: 17 U/L (ref 0–37)
Albumin: 3.7 g/dL (ref 3.5–5.2)
Alkaline Phosphatase: 69 U/L (ref 39–117)
BILIRUBIN TOTAL: 0.6 mg/dL (ref 0.3–1.2)
BUN: 26 mg/dL — ABNORMAL HIGH (ref 6–23)
CHLORIDE: 102 meq/L (ref 96–112)
CO2: 29 meq/L (ref 19–32)
CREATININE: 0.93 mg/dL (ref 0.50–1.10)
Calcium: 10.9 mg/dL — ABNORMAL HIGH (ref 8.4–10.5)
GFR calc Af Amer: 67 mL/min — ABNORMAL LOW (ref >90)
GFR, EST NON AFRICAN AMERICAN: 58 mL/min — AB (ref >90)
Glucose, Bld: 105 mg/dL — ABNORMAL HIGH (ref 70–99)
POTASSIUM: 4.3 meq/L (ref 3.7–5.3)
Sodium: 142 mEq/L (ref 137–147)
Total Protein: 7.4 g/dL (ref 6.0–8.3)

## 2014-01-02 LAB — CBC
HEMATOCRIT: 45.1 % (ref 36.0–46.0)
Hemoglobin: 15.3 g/dL — ABNORMAL HIGH (ref 12.0–15.0)
MCH: 30.5 pg (ref 26.0–34.0)
MCHC: 33.9 g/dL (ref 30.0–36.0)
MCV: 89.8 fL (ref 78.0–100.0)
PLATELETS: 218 10*3/uL (ref 150–400)
RBC: 5.02 MIL/uL (ref 3.87–5.11)
RDW: 13.8 % (ref 11.5–15.5)
WBC: 9.3 10*3/uL (ref 4.0–10.5)

## 2014-01-02 LAB — PROTIME-INR
INR: 0.94 (ref 0.00–1.49)
Prothrombin Time: 12.4 seconds (ref 11.6–15.2)

## 2014-01-02 LAB — SURGICAL PCR SCREEN
MRSA, PCR: NEGATIVE
Staphylococcus aureus: NEGATIVE

## 2014-01-02 LAB — APTT: APTT: 25 s (ref 24–37)

## 2014-01-02 LAB — ABO/RH: ABO/RH(D): O POS

## 2014-01-02 NOTE — Progress Notes (Signed)
Patient unable to get ua. Need dos

## 2014-01-02 NOTE — Progress Notes (Signed)
req'd ekg, notes from pcp dr Crawford Givensgraham duncan [facility]

## 2014-01-05 ENCOUNTER — Encounter (HOSPITAL_COMMUNITY): Payer: Self-pay

## 2014-01-05 MED ORDER — DEXTROSE 5 % IV SOLN
1.5000 g | INTRAVENOUS | Status: AC
  Administered 2014-01-06: 1.5 g via INTRAVENOUS
  Filled 2014-01-05: qty 1.5

## 2014-01-05 NOTE — Progress Notes (Signed)
Anesthesia Chart Review:  Patient is a 79 year old female scheduled for right CEA on 01/06/14 by Dr. Edilia Boickson.   History includes former smoker, hypertension, hyperlipidemia, arthritis, heart murmur (mild AS/MR, moderate TR on 12/2013 echo).  12/29/13 carotid duplex showed > 80 % RICA stenosis and occluded proximal to distal LICA with mild LECA stenosis and mild bilateral SCA stenosis, antegrade vertebral flow. Head CT on 12/30/13 did show chronic right MCA territory ischemia, but no acute intracranial abnormality. PCP is Dr. Crawford GivensGraham Duncan. Based on notes in Epic, it sound like she had an episode of amaurosis fugax and leg weakness on 12/26/13 which prompted echo, carotid duplex, head CT and prompt referral to [facility] for intervention.   EKG on 01/02/14 showed SB @ 59 bpm, short PR, anteroseptal infarct (age undetermined).  Currently, there are no comparison EKGs available. There was no chest pain reported at her PAT or [facility]VS visits.  Echo on 12/30/13 showed: - Left ventricle: The cavity size was normal. There was mild focal basal hypertrophy of the septum. Systolic function was normal. The estimated ejection fraction was in the range of 60% to 65%. Wall motion was normal; there were no regional wall motion abnormalities. Doppler parameters are consistent with abnormal left ventricular relaxation (grade 1 diastolic dysfunction). Doppler parameters are consistent with high ventricular filling pressure. - Aortic valve: Noncoronary cusp mobility was moderately restricted. There was mild stenosis. Trivial regurgitation. Valve area: 1.81cm^2(VTI). - Mitral valve: Calcified annulus. Mildly thickened leaflets. Mild regurgitation. - Left atrium: The atrium was mildly dilated. - Tricuspid valve: Moderate regurgitation. - Pulmonary arteries: Systolic pressure was mildly to moderately increased. PA peak pressure: 40mm Hg (S). Impressions: Compared to 04/10/12, aortic stenosis remains mild by doppler and TR is now  moderate.  Preoperative CXR and labs noted.  She is due to have a UA done on arrival.  Patient with severe right ICA stenosis with occluded left ICA.  Her AS is stable by recent echo.  No acute infarct by recent head CT. No chest pain reported.  Further evaluation by her assigned anesthesiologist on the day of surgery.  If she remains without any worrisome CV symptoms or other acute changes then I would anticipate that she could proceed as planned.  Velna Ochsllison Zelenak, PA-C [facility]/Anesthesiology Phone [phone] 01/05/2014 10:37 AM

## 2014-01-06 ENCOUNTER — Encounter (HOSPITAL_COMMUNITY)
Admission: RE | Disposition: A | Discharge status: Home / Self Care | Payer: Self-pay | Source: Ambulatory Visit | Attending: Vascular Surgery

## 2014-01-06 ENCOUNTER — Inpatient Hospital Stay (HOSPITAL_COMMUNITY): Payer: Medicare Other | Admitting: Certified Registered Nurse Anesthetist

## 2014-01-06 ENCOUNTER — Inpatient Hospital Stay (HOSPITAL_COMMUNITY)
Admission: RE | Admit: 2014-01-06 | Discharge: 2014-01-07 | DRG: 039 | Disposition: A | Discharge status: Home / Self Care | Payer: Medicare Other | Source: Ambulatory Visit | Attending: Vascular Surgery | Admitting: Vascular Surgery

## 2014-01-06 ENCOUNTER — Encounter (HOSPITAL_COMMUNITY): Payer: Medicare Other | Admitting: Vascular Surgery

## 2014-01-06 ENCOUNTER — Encounter (HOSPITAL_COMMUNITY): Payer: Self-pay | Admitting: *Deleted

## 2014-01-06 DIAGNOSIS — I6529 Occlusion and stenosis of unspecified carotid artery: Secondary | ICD-10-CM

## 2014-01-06 DIAGNOSIS — M129 Arthropathy, unspecified: Secondary | ICD-10-CM | POA: Diagnosis present

## 2014-01-06 DIAGNOSIS — Z87891 Personal history of nicotine dependence: Secondary | ICD-10-CM

## 2014-01-06 DIAGNOSIS — I10 Essential (primary) hypertension: Secondary | ICD-10-CM | POA: Diagnosis present

## 2014-01-06 DIAGNOSIS — E785 Hyperlipidemia, unspecified: Secondary | ICD-10-CM | POA: Diagnosis present

## 2014-01-06 HISTORY — PX: ENDARTERECTOMY: SHX5162

## 2014-01-06 LAB — URINE MICROSCOPIC-ADD ON

## 2014-01-06 LAB — URINALYSIS, ROUTINE W REFLEX MICROSCOPIC
Bilirubin Urine: NEGATIVE
GLUCOSE, UA: NEGATIVE mg/dL
HGB URINE DIPSTICK: NEGATIVE
Ketones, ur: NEGATIVE mg/dL
Nitrite: NEGATIVE
PROTEIN: 30 mg/dL — AB
SPECIFIC GRAVITY, URINE: 1.028 (ref 1.005–1.030)
Urobilinogen, UA: 0.2 mg/dL (ref 0.0–1.0)
pH: 5.5 (ref 5.0–8.0)

## 2014-01-06 SURGERY — ENDARTERECTOMY, CAROTID
Anesthesia: General | Site: Neck | Laterality: Right

## 2014-01-06 MED ORDER — OXYCODONE HCL 5 MG PO TABS: 5.0000 mg | ORAL_TABLET | Freq: Four times a day (QID) | ORAL | Status: AC | PRN

## 2014-01-06 MED ORDER — HEPARIN SODIUM (PORCINE) 1000 UNIT/ML IJ SOLN
INTRAMUSCULAR | Status: AC
  Filled 2014-01-06: qty 1

## 2014-01-06 MED ORDER — ASPIRIN EC 325 MG PO TBEC
325.0000 mg | DELAYED_RELEASE_TABLET | Freq: Every day | ORAL | Status: DC
  Administered 2014-01-06 – 2014-01-07 (×2): 325 mg via ORAL
  Filled 2014-01-06 (×2): qty 1

## 2014-01-06 MED ORDER — PROMETHAZINE HCL 25 MG/ML IJ SOLN: 6.2500 mg | INTRAMUSCULAR | Status: DC | PRN

## 2014-01-06 MED ORDER — LIDOCAINE HCL (PF) 1 % IJ SOLN
INTRAMUSCULAR | Status: AC
  Filled 2014-01-06: qty 30

## 2014-01-06 MED ORDER — ROCURONIUM BROMIDE 100 MG/10ML IV SOLN
INTRAVENOUS | Status: DC | PRN
  Administered 2014-01-06: 50 mg via INTRAVENOUS

## 2014-01-06 MED ORDER — SODIUM CHLORIDE 0.9 % IV SOLN
500.0000 mL | Freq: Once | INTRAVENOUS | Status: AC | PRN
  Administered 2014-01-06: 500 mL via INTRAVENOUS

## 2014-01-06 MED ORDER — THROMBIN 20000 UNITS EX SOLR
CUTANEOUS | Status: AC
  Filled 2014-01-06: qty 20000

## 2014-01-06 MED ORDER — PROTAMINE SULFATE 10 MG/ML IV SOLN
INTRAVENOUS | Status: DC | PRN
  Administered 2014-01-06: 25 mg via INTRAVENOUS

## 2014-01-06 MED ORDER — LIDOCAINE HCL (CARDIAC) 20 MG/ML IV SOLN
INTRAVENOUS | Status: DC | PRN
  Administered 2014-01-06: 60 mg via INTRAVENOUS

## 2014-01-06 MED ORDER — ALUM & MAG HYDROXIDE-SIMETH 200-200-20 MG/5ML PO SUSP: 15.0000 mL | ORAL | Status: DC | PRN

## 2014-01-06 MED ORDER — MIDAZOLAM HCL 2 MG/2ML IJ SOLN
INTRAMUSCULAR | Status: AC
  Filled 2014-01-06: qty 2

## 2014-01-06 MED ORDER — SODIUM CHLORIDE 0.9 % IR SOLN
Status: DC | PRN
  Administered 2014-01-06: 08:00:00

## 2014-01-06 MED ORDER — LACTATED RINGERS IV SOLN
INTRAVENOUS | Status: DC | PRN
  Administered 2014-01-06: 07:00:00 via INTRAVENOUS

## 2014-01-06 MED ORDER — FENTANYL CITRATE 0.05 MG/ML IJ SOLN
INTRAMUSCULAR | Status: AC
  Filled 2014-01-06: qty 5

## 2014-01-06 MED ORDER — POLYVINYL ALCOHOL 1.4 % OP SOLN
1.0000 [drp] | OPHTHALMIC | Status: DC | PRN
  Filled 2014-01-06: qty 15

## 2014-01-06 MED ORDER — METOPROLOL TARTRATE 50 MG PO TABS
50.0000 mg | ORAL_TABLET | Freq: Two times a day (BID) | ORAL | Status: DC
  Filled 2014-01-06 (×3): qty 1

## 2014-01-06 MED ORDER — SODIUM CHLORIDE 0.9 % IV SOLN: INTRAVENOUS | Status: DC

## 2014-01-06 MED ORDER — DOCUSATE SODIUM 100 MG PO CAPS
100.0000 mg | ORAL_CAPSULE | Freq: Every day | ORAL | Status: DC
  Administered 2014-01-07: 100 mg via ORAL
  Filled 2014-01-06: qty 1

## 2014-01-06 MED ORDER — HEPARIN SODIUM (PORCINE) 1000 UNIT/ML IJ SOLN
INTRAMUSCULAR | Status: DC | PRN
  Administered 2014-01-06: 5000 [IU] via INTRAVENOUS

## 2014-01-06 MED ORDER — ACETAMINOPHEN 650 MG RE SUPP: 325.0000 mg | RECTAL | Status: DC | PRN

## 2014-01-06 MED ORDER — ARTIFICIAL TEARS OP OINT
TOPICAL_OINTMENT | OPHTHALMIC | Status: DC | PRN
  Administered 2014-01-06: 1 via OPHTHALMIC

## 2014-01-06 MED ORDER — EPHEDRINE SULFATE 50 MG/ML IJ SOLN
INTRAMUSCULAR | Status: AC
  Filled 2014-01-06: qty 1

## 2014-01-06 MED ORDER — GUAIFENESIN-DM 100-10 MG/5ML PO SYRP: 15.0000 mL | ORAL_SOLUTION | ORAL | Status: DC | PRN

## 2014-01-06 MED ORDER — OXYCODONE HCL 5 MG/5ML PO SOLN: 5.0000 mg | Freq: Once | ORAL | Status: DC | PRN

## 2014-01-06 MED ORDER — OCUVITE PO TABS
1.0000 | ORAL_TABLET | Freq: Every day | ORAL | Status: DC
  Administered 2014-01-07: 1 via ORAL
  Filled 2014-01-06 (×2): qty 1

## 2014-01-06 MED ORDER — CHLORHEXIDINE GLUCONATE 4 % EX LIQD
60.0000 mL | Freq: Once | CUTANEOUS | Status: DC
  Filled 2014-01-06: qty 60

## 2014-01-06 MED ORDER — OXYCODONE HCL 5 MG PO TABS: 5.0000 mg | ORAL_TABLET | Freq: Once | ORAL | Status: DC | PRN

## 2014-01-06 MED ORDER — ONDANSETRON HCL 4 MG/2ML IJ SOLN
4.0000 mg | Freq: Four times a day (QID) | INTRAMUSCULAR | Status: DC | PRN
  Filled 2014-01-06: qty 2

## 2014-01-06 MED ORDER — LIDOCAINE-EPINEPHRINE (PF) 1 %-1:200000 IJ SOLN
INTRAMUSCULAR | Status: DC | PRN
  Administered 2014-01-06: 10 mL

## 2014-01-06 MED ORDER — ENOXAPARIN SODIUM 30 MG/0.3ML ~~LOC~~ SOLN
30.0000 mg | SUBCUTANEOUS | Status: DC
  Administered 2014-01-06: 30 mg via SUBCUTANEOUS
  Filled 2014-01-06 (×2): qty 0.3

## 2014-01-06 MED ORDER — ONDANSETRON HCL 4 MG/2ML IJ SOLN
INTRAMUSCULAR | Status: AC
  Filled 2014-01-06: qty 2

## 2014-01-06 MED ORDER — MORPHINE SULFATE 2 MG/ML IJ SOLN: 2.0000 mg | INTRAMUSCULAR | Status: DC | PRN

## 2014-01-06 MED ORDER — DEXTRAN 40 IN SALINE 10-0.9 % IV SOLN
INTRAVENOUS | Status: DC | PRN
  Administered 2014-01-06: 25 mL/h

## 2014-01-06 MED ORDER — ONDANSETRON HCL 4 MG/2ML IJ SOLN
INTRAMUSCULAR | Status: AC
Start: 2014-01-06 — End: 2014-01-06
  Filled 2014-01-06: qty 2

## 2014-01-06 MED ORDER — LABETALOL HCL 5 MG/ML IV SOLN
INTRAVENOUS | Status: DC | PRN
  Administered 2014-01-06: 10 mg via INTRAVENOUS

## 2014-01-06 MED ORDER — LIDOCAINE HCL (CARDIAC) 20 MG/ML IV SOLN
INTRAVENOUS | Status: AC
  Filled 2014-01-06: qty 5

## 2014-01-06 MED ORDER — POTASSIUM CHLORIDE CRYS ER 20 MEQ PO TBCR: 20.0000 meq | EXTENDED_RELEASE_TABLET | Freq: Every day | ORAL | Status: DC | PRN

## 2014-01-06 MED ORDER — GLYCOPYRROLATE 0.2 MG/ML IJ SOLN
INTRAMUSCULAR | Status: AC
  Filled 2014-01-06: qty 2

## 2014-01-06 MED ORDER — ALBUMIN HUMAN 5 % IV SOLN
INTRAVENOUS | Status: DC | PRN
  Administered 2014-01-06: 08:00:00 via INTRAVENOUS

## 2014-01-06 MED ORDER — HYDROMORPHONE HCL PF 1 MG/ML IJ SOLN: 0.2500 mg | INTRAMUSCULAR | Status: DC | PRN

## 2014-01-06 MED ORDER — ONDANSETRON HCL 4 MG/2ML IJ SOLN
INTRAMUSCULAR | Status: DC | PRN
  Administered 2014-01-06: 4 mg via INTRAVENOUS

## 2014-01-06 MED ORDER — LABETALOL HCL 5 MG/ML IV SOLN: 10.0000 mg | INTRAVENOUS | Status: DC | PRN

## 2014-01-06 MED ORDER — DEXTROSE 5 % IV SOLN
1.5000 g | Freq: Two times a day (BID) | INTRAVENOUS | Status: AC
  Administered 2014-01-06 – 2014-01-07 (×2): 1.5 g via INTRAVENOUS
  Filled 2014-01-06 (×2): qty 1.5

## 2014-01-06 MED ORDER — ARTIFICIAL TEARS OP OINT
TOPICAL_OINTMENT | OPHTHALMIC | Status: AC
  Filled 2014-01-06: qty 3.5

## 2014-01-06 MED ORDER — DOPAMINE-DEXTROSE 3.2-5 MG/ML-% IV SOLN
3.0000 ug/kg/min | INTRAVENOUS | Status: DC | PRN
  Administered 2014-01-06: 2.925 ug/kg/min via INTRAVENOUS

## 2014-01-06 MED ORDER — FENTANYL CITRATE 0.05 MG/ML IJ SOLN
INTRAMUSCULAR | Status: DC | PRN
  Administered 2014-01-06 (×2): 25 ug via INTRAVENOUS

## 2014-01-06 MED ORDER — DEXTRAN 40 IN SALINE 10-0.9 % IV SOLN
INTRAVENOUS | Status: AC
  Filled 2014-01-06: qty 500

## 2014-01-06 MED ORDER — 0.9 % SODIUM CHLORIDE (POUR BTL) OPTIME
TOPICAL | Status: DC | PRN
  Administered 2014-01-06: 2000 mL

## 2014-01-06 MED ORDER — ROCURONIUM BROMIDE 50 MG/5ML IV SOLN
INTRAVENOUS | Status: AC
  Filled 2014-01-06: qty 1

## 2014-01-06 MED ORDER — NEOSTIGMINE METHYLSULFATE 1 MG/ML IJ SOLN
INTRAMUSCULAR | Status: AC
  Filled 2014-01-06: qty 10

## 2014-01-06 MED ORDER — METOPROLOL TARTRATE 1 MG/ML IV SOLN: 2.0000 mg | INTRAVENOUS | Status: DC | PRN

## 2014-01-06 MED ORDER — PANTOPRAZOLE SODIUM 40 MG PO TBEC
40.0000 mg | DELAYED_RELEASE_TABLET | Freq: Every day | ORAL | Status: DC
  Administered 2014-01-07: 40 mg via ORAL
  Filled 2014-01-06: qty 1

## 2014-01-06 MED ORDER — EPHEDRINE SULFATE 50 MG/ML IJ SOLN
INTRAMUSCULAR | Status: DC | PRN
  Administered 2014-01-06: 5 mg via INTRAVENOUS

## 2014-01-06 MED ORDER — ACETAMINOPHEN 325 MG PO TABS
325.0000 mg | ORAL_TABLET | ORAL | Status: DC | PRN
  Administered 2014-01-07: 650 mg via ORAL
  Filled 2014-01-06: qty 2

## 2014-01-06 MED ORDER — PHENOL 1.4 % MT LIQD: 1.0000 | OROMUCOSAL | Status: DC | PRN

## 2014-01-06 MED ORDER — PROPOFOL 10 MG/ML IV BOLUS
INTRAVENOUS | Status: AC
  Filled 2014-01-06: qty 20

## 2014-01-06 MED ORDER — MIDAZOLAM HCL 2 MG/2ML IJ SOLN: 1.0000 mg | INTRAMUSCULAR | Status: DC | PRN

## 2014-01-06 MED ORDER — FENTANYL CITRATE 0.05 MG/ML IJ SOLN: 50.0000 ug | Freq: Once | INTRAMUSCULAR | Status: DC

## 2014-01-06 MED ORDER — HYDRALAZINE HCL 20 MG/ML IJ SOLN: 10.0000 mg | INTRAMUSCULAR | Status: DC | PRN

## 2014-01-06 MED ORDER — MAGNESIUM SULFATE 40 MG/ML IJ SOLN: 2.0000 g | Freq: Every day | INTRAMUSCULAR | Status: DC | PRN

## 2014-01-06 MED ORDER — LISINOPRIL 20 MG PO TABS
20.0000 mg | ORAL_TABLET | Freq: Two times a day (BID) | ORAL | Status: DC
  Administered 2014-01-07: 20 mg via ORAL
  Filled 2014-01-06 (×3): qty 1

## 2014-01-06 MED ORDER — NEOSTIGMINE METHYLSULFATE 1 MG/ML IJ SOLN
INTRAMUSCULAR | Status: DC | PRN
  Administered 2014-01-06: 3 mg via INTRAVENOUS

## 2014-01-06 MED ORDER — GLYCOPYRROLATE 0.2 MG/ML IJ SOLN
INTRAMUSCULAR | Status: DC | PRN
  Administered 2014-01-06: .4 mg via INTRAVENOUS
  Administered 2014-01-06: 0.2 mg via INTRAVENOUS

## 2014-01-06 MED ORDER — OXYCODONE HCL 5 MG PO TABS
5.0000 mg | ORAL_TABLET | Freq: Four times a day (QID) | ORAL | Status: DC | PRN
  Administered 2014-01-06: 5 mg via ORAL
  Filled 2014-01-06: qty 1

## 2014-01-06 MED ORDER — DOPAMINE-DEXTROSE 3.2-5 MG/ML-% IV SOLN
INTRAVENOUS | Status: AC
  Filled 2014-01-06: qty 250

## 2014-01-06 MED ORDER — PHENYLEPHRINE HCL 10 MG/ML IJ SOLN
20.0000 mg | INTRAVENOUS | Status: DC | PRN
  Administered 2014-01-06: 25 ug via INTRAVENOUS

## 2014-01-06 MED ORDER — PROPOFOL 10 MG/ML IV BOLUS
INTRAVENOUS | Status: DC | PRN
  Administered 2014-01-06: 120 mg via INTRAVENOUS

## 2014-01-06 MED ORDER — POLYETHYL GLYCOL-PROPYL GLYCOL 0.4-0.3 % OP GEL: 1.0000 [drp] | Freq: Every evening | OPHTHALMIC | Status: DC | PRN

## 2014-01-06 MED ORDER — GLYCOPYRROLATE 0.2 MG/ML IJ SOLN
INTRAMUSCULAR | Status: AC
  Administered 2014-01-06: 0.2 mg
  Filled 2014-01-06: qty 1

## 2014-01-06 SURGICAL SUPPLY — 53 items
BAG DECANTER FOR FLEXI CONT (MISCELLANEOUS) ×3 IMPLANT
CANISTER SUCTION 2500CC (MISCELLANEOUS) ×3 IMPLANT
CANNULA VESSEL 3MM 2 BLNT TIP (CANNULA) ×6 IMPLANT
CATH ROBINSON RED A/P 18FR (CATHETERS) ×3 IMPLANT
CLIP TI MEDIUM 24 (CLIP) ×3 IMPLANT
CLIP TI WIDE RED SMALL 24 (CLIP) ×3 IMPLANT
COVER SURGICAL LIGHT HANDLE (MISCELLANEOUS) ×3 IMPLANT
CRADLE DONUT ADULT HEAD (MISCELLANEOUS) ×3 IMPLANT
DERMABOND ADVANCED (GAUZE/BANDAGES/DRESSINGS) ×2
DERMABOND ADVANCED .7 DNX12 (GAUZE/BANDAGES/DRESSINGS) ×1 IMPLANT
DRAIN CHANNEL 15F RND FF W/TCR (WOUND CARE) IMPLANT
DRAPE WARM FLUID 44X44 (DRAPE) ×3 IMPLANT
ELECT REM PT RETURN 9FT ADLT (ELECTROSURGICAL) ×3
ELECTRODE REM PT RTRN 9FT ADLT (ELECTROSURGICAL) ×1 IMPLANT
EVACUATOR SILICONE 100CC (DRAIN) IMPLANT
GLOVE BIO SURGEON STRL SZ 6.5 (GLOVE) ×6 IMPLANT
GLOVE BIO SURGEON STRL SZ7.5 (GLOVE) ×3 IMPLANT
GLOVE BIO SURGEONS STRL SZ 6.5 (GLOVE) ×3
GLOVE BIOGEL PI IND STRL 6.5 (GLOVE) ×1 IMPLANT
GLOVE BIOGEL PI IND STRL 7.0 (GLOVE) ×2 IMPLANT
GLOVE BIOGEL PI IND STRL 8 (GLOVE) ×1 IMPLANT
GLOVE BIOGEL PI INDICATOR 6.5 (GLOVE) ×2
GLOVE BIOGEL PI INDICATOR 7.0 (GLOVE) ×4
GLOVE BIOGEL PI INDICATOR 8 (GLOVE) ×2
GOWN STRL REUS W/ TWL LRG LVL3 (GOWN DISPOSABLE) ×2 IMPLANT
GOWN STRL REUS W/ TWL XL LVL3 (GOWN DISPOSABLE) ×2 IMPLANT
GOWN STRL REUS W/TWL LRG LVL3 (GOWN DISPOSABLE) ×4
GOWN STRL REUS W/TWL XL LVL3 (GOWN DISPOSABLE) ×4
KIT BASIN OR (CUSTOM PROCEDURE TRAY) ×3 IMPLANT
KIT ROOM TURNOVER OR (KITS) ×3 IMPLANT
NEEDLE 18GX1X1/2 (RX/OR ONLY) (NEEDLE) ×3 IMPLANT
NEEDLE HYPO 25X1 1.5 SAFETY (NEEDLE) ×3 IMPLANT
NS IRRIG 1000ML POUR BTL (IV SOLUTION) ×6 IMPLANT
PACK CAROTID (CUSTOM PROCEDURE TRAY) ×3 IMPLANT
PAD ARMBOARD 7.5X6 YLW CONV (MISCELLANEOUS) ×6 IMPLANT
PATCH HEMASHIELD 8X150 (Vascular Products) ×3 IMPLANT
SHUNT CAROTID BYPASS 10 (VASCULAR PRODUCTS) ×3 IMPLANT
SHUNT CAROTID BYPASS 12FRX15.5 (VASCULAR PRODUCTS) IMPLANT
SPONGE SURGIFOAM ABS GEL 100 (HEMOSTASIS) IMPLANT
SUT PROLENE 6 0 BV (SUTURE) ×9 IMPLANT
SUT PROLENE 7 0 BV 1 (SUTURE) IMPLANT
SUT SILK 2 0 FS (SUTURE) IMPLANT
SUT SILK 3 0 TIES 17X18 (SUTURE)
SUT SILK 3-0 18XBRD TIE BLK (SUTURE) IMPLANT
SUT VIC AB 3-0 SH 27 (SUTURE) ×2
SUT VIC AB 3-0 SH 27X BRD (SUTURE) ×1 IMPLANT
SUT VICRYL 4-0 PS2 18IN ABS (SUTURE) ×3 IMPLANT
SYR 20ML ECCENTRIC (SYRINGE) ×3 IMPLANT
SYR CONTROL 10ML LL (SYRINGE) ×3 IMPLANT
SYR TB 1ML LUER SLIP (SYRINGE) ×3 IMPLANT
TOWEL OR 17X24 6PK STRL BLUE (TOWEL DISPOSABLE) ×3 IMPLANT
TOWEL OR 17X26 10 PK STRL BLUE (TOWEL DISPOSABLE) ×3 IMPLANT
WATER STERILE IRR 1000ML POUR (IV SOLUTION) IMPLANT

## 2014-01-06 NOTE — Anesthesia Postprocedure Evaluation (Signed)
  Anesthesia Post-op Note  Patient: Doran StablerBarbara F Moffat  Procedure(s) Performed: Procedure(s): RIGHT CAROTID ENDARTERECTOMY (Right)  Patient Location: PACU  Anesthesia Type:General  Level of Consciousness: awake  Airway and Oxygen Therapy: Patient Spontanous Breathing  Post-op Pain: mild  Post-op Assessment: Post-op Vital signs reviewed, Patient's Cardiovascular Status Stable, Respiratory Function Stable, Patent Airway, No signs of Nausea or vomiting and Pain level controlled  Post-op Vital Signs: Reviewed and stable  Complications: No apparent anesthesia complications

## 2014-01-06 NOTE — Op Note (Signed)
    NAME: Jacqueline StablerBarbara F Cline    MRN: [id number] DOB: 02/09/1935    DATE OF OPERATION: 01/06/2014  PREOP DIAGNOSIS: Symptomatic right carotid stenosis  POSTOP DIAGNOSIS: same  PROCEDURE: right carotid endarterectomy with Dacron patch angioplasty  SURGEON: Di Kindlehristopher S. Edilia Boickson, MD, FACS  ASSIST: Doreatha MassedSamantha Rhyne, PA  ANESTHESIA: Gen.   EBL: 75 cc  INDICATIONS: Jacqueline StablerBarbara F Johanning is a 79 y.o. female who had been having problems with dizziness. She had a carotid duplex scan which showed an occluded left internal carotid artery with a very tight right carotid stenosis. Right carotid endarterectomy was recommended in order to lower her risk of future stroke.  FINDINGS: 95% right carotid stenosis.  TECHNIQUE: The patient was taken to the operating room and received a general anesthetic. Monitoring lines had been placed by anesthesia. The right neck was prepped and draped in usual sterile fashion. An incision was made along the anterior border of the sternocleidomastoid and dissection carried down to the common carotid artery which was dissected free and controlled with Rummel tourniquet. The facial vein was divided between 2-0 silk ties. The superior thyroid artery and external carotid artery were controlled. Of note the internal carotid artery was further posterior and medial than normal and therefore the external was retracted to allow better exposure of the internal carotid artery. The internal carotid artery was controlled above the plaque. This required fairly high dissection as the plaque was quite long. The patient was heparinized. Clamps were then placed on the internal then the external then the common carotid artery. A longitudinal arteriotomy was made in the common carotid artery and this was extended into the plaque into the internal carotid artery. A 10 shunt was placed into the internal carotid artery and then back bled and then placed into the common carotid artery and secured with Rummel  tourniquet. Flow was reestablished the shunt. Endarterectomy plane was established proximally and the plaque was sharply divided. Eversion endarterectomy was performed of the external carotid artery. Distally there was nice tapering the plaque one tacking suture was placed. The artery was irrigated with segments of heparin and dextran and all loose debris removed. A long Dacron patch was then sewn using 2 continuous 6-0 Prolene sutures. Prior to completing the patch closure, the shunt was removed, the artery was backbled and flushed accordingly and the anastomosis completed. Flow was reestablished first to the external carotid artery into the internal carotid artery. At the completion was an excellent Doppler signal distally patch good diastolic flow. Heparin was partially reversed with protamine. The wounds closed the deep layer 3-0 Vicryl. The platysma was closed with running 3-0 Vicryl. The skin was closed with 4-0 subcuticular stitch. Dermabond was applied. The patient tolerated the procedure well and was transferred to the recovery room in stable condition. She awoke neurological intact.  Waverly Ferrarihristopher Dickson, MD, FACS [facility]  DATE OF DICTATION:   01/06/2014

## 2014-01-06 NOTE — Anesthesia Procedure Notes (Signed)
Performed by: Aemon Koeller       

## 2014-01-06 NOTE — Progress Notes (Signed)
Dr.Dickson at bedside to assess pt and vitals and okay with pt going to room at this time

## 2014-01-06 NOTE — Progress Notes (Signed)
Utilization review completed.  

## 2014-01-06 NOTE — Anesthesia Preprocedure Evaluation (Signed)
Anesthesia Evaluation  Patient identified by MRN, date of birth, ID band Patient awake    Reviewed: Allergy & Precautions, H&P , NPO status , Patient's Chart, lab work & pertinent test results  Airway Mallampati: I TM Distance: >3 FB Neck ROM: Full    Dental   Pulmonary former smoker,  breath sounds clear to auscultation        Cardiovascular hypertension, Rhythm:Regular Rate:Normal     Neuro/Psych CVA    GI/Hepatic   Endo/Other    Renal/GU      Musculoskeletal   Abdominal   Peds  Hematology   Anesthesia Other Findings   Reproductive/Obstetrics                           Anesthesia Physical Anesthesia Plan  ASA: III  Anesthesia Plan: General   Post-op Pain Management:    Induction: Intravenous  Airway Management Planned: Oral ETT  Additional Equipment: Arterial line  Intra-op Plan:   Post-operative Plan: Extubation in OR  Informed Consent: I have reviewed the patients History and Physical, chart, labs and discussed the procedure including the risks, benefits and alternatives for the proposed anesthesia with the patient or authorized representative who has indicated his/her understanding and acceptance.     Plan Discussed with: CRNA and Surgeon  Anesthesia Plan Comments:         Anesthesia Quick Evaluation

## 2014-01-06 NOTE — Progress Notes (Signed)
   VASCULAR PROGRESS NOTE  SUBJECTIVE: No complaints  PHYSICAL EXAM: Filed Vitals:   01/06/14 1230 01/06/14 1245 01/06/14 1300 01/06/14 1401  BP: 101/36   107/49  Pulse: 52  62   Temp:  98 F (36.7 C)  98.4 F (36.9 C)  TempSrc:    Oral  Resp: 10  11   Height:    5\' 4"  (1.626 m)  Weight:      SpO2: 97%  97%    Neuro at baseline, has had some left sided weakness since childhood. Incision looks fine  LABS: Lab Results  Component Value Date   WBC 9.3 01/02/2014   HGB 15.3* 01/02/2014   HCT 45.1 01/02/2014   MCV 89.8 01/02/2014   PLT 218 01/02/2014   Lab Results  Component Value Date   CREATININE 0.93 01/02/2014   Lab Results  Component Value Date   INR 0.94 01/02/2014   CBG (last 3)  No results found for this basename: GLUCAP,  in the last 72 hours  Active Problems:   Carotid artery stenosis   ASSESSMENT AND PLAN:  * Stable post op  *  On 3 mcg/kg/min of Dopamine for her BP  Jacqueline CarawayChris Cline Beeper: [phone] 01/06/2014  '

## 2014-01-06 NOTE — Interval H&P Note (Signed)
History and Physical Interval Note:  01/06/2014 7:21 AM  Jacqueline Cline  has presented today for surgery, with the diagnosis of Carotid stenosis  The various methods of treatment have been discussed with the patient and family. After consideration of risks, benefits and other options for treatment, the patient has consented to  Procedure(s): ENDARTERECTOMY CAROTID (Right) as a surgical intervention .  The patient's history has been reviewed, patient examined, no change in status, stable for surgery.  I have reviewed the patient's chart and labs.  Questions were answered to the patient's satisfaction.     DICKSON,CHRISTOPHER S

## 2014-01-06 NOTE — Progress Notes (Signed)
Dr.Kasis at bedside. Pt cont to be hypotensive and bradycardic after 500ccNS IVFB x2. Orders received to go ahead and start dopamine per standing order. Pt cont to be asymptomatic. Will start Dopamine and cont to monitor closely.

## 2014-01-06 NOTE — Transfer of Care (Signed)
Immediate Anesthesia Transfer of Care Note  Patient: Jacqueline StablerBarbara F Cline  Procedure(s) Performed: Procedure(s): RIGHT CAROTID ENDARTERECTOMY (Right)  Patient Location: PACU  Anesthesia Type:General  Level of Consciousness: awake, alert  and oriented  Airway & Oxygen Therapy: Patient Spontanous Breathing  Post-op Assessment: Report given to PACU RN  Post vital signs: Reviewed and stable  Complications: No apparent anesthesia complications

## 2014-01-06 NOTE — H&P (View-Only) (Signed)
Patient ID: Jacqueline Cline, female   DOB: 1935-04-29, 79 y.o.   MRN: [id number]  Reason for Consult: Carotid   Referred by Joaquim Nam, MD  Subjective:     HPI:    Jacqueline Cline is a 79 y.o. female who has had some problems with unsteady gait and dizziness recently. This prompted a carotid duplex scan that was done at [place] which showed an occlusion of the left internal carotid artery and a tight stenosis on the right. The patient was sent for vascular consultation today. She is right-handed. She denies any history of stroke, TIAs, expressive or receptive aphasia. However, she states that something occurred at birth and she is always had some weakness in the left upper extremity and left lower extremity.  She is currently on Pravachol, however, she does not take this routinely because she has problems with muscle pain when she takes her statin. She is not on aspirin per  Her risk factors for peripheral vascular disease include hypertension, hypercholesterolemia, and a history of premature cardiovascular disease. She denies any history of diabetes. She has a remote history of tobacco use.  Past Medical History  Diagnosis Date  . Hyperlipidemia 12/07/2004  . Hypertension 03/09/2004  . Carotid artery occlusion    Family History  Problem Relation Age of Onset  . Heart disease Mother 36    CABG, POSTOP  . Heart disease Father 53    MI : Depression  . Heart attack Father   . Hypertension Sister     hyperthyroid- Removed // Decreased hearing  . Cancer Paternal Uncle     unknown type  . Stroke Maternal Grandmother   . Diabetes Neg Hx   . Alcohol abuse Neg Hx   . Colon cancer Neg Hx   . Breast cancer Neg Hx    Past Surgical History  Procedure Laterality Date  . Eye surgery      cataract removed OD 01/05/2005//cataract Removed OS 03/14/2005  . Orif ankle fracture  1995    Right ankle fracture  . Normal vaginal delivery  1962   Short Social History:  History    Substance Use Topics  . Smoking status: Former Smoker    Types: Cigarettes  . Smokeless tobacco: Former Neurosurgeon    Quit date: 01/01/1964     Comment: quit over 50 years  . Alcohol Use: No   Allergies  Allergen Reactions  . Simvastatin     myalgias   Current Outpatient Prescriptions  Medication Sig Dispense Refill  . Ascorbic Acid (VITAMIN C) 1000 MG tablet Take 1,000 mg by mouth daily.        . Beta Carotene (VITAMIN A) 25000 UNIT capsule Take 25,000 Units by mouth daily.      . cholecalciferol (VITAMIN D) 1000 UNITS tablet Take 1,000 Units by mouth daily.      Marland Kitchen Cod Liver Oil CAPS Take by mouth.      . Coenzyme Q10 (CO Q 10) 100 MG CAPS Take by mouth 2 (two) times daily.        . cycloSPORINE (RESTASIS) 0.05 % ophthalmic emulsion 1 drop 2 (two) times daily.      Marland Kitchen lisinopril (PRINIVIL,ZESTRIL) 20 MG tablet Take 1 tablet (20 mg total) by mouth 2 (two) times daily.  180 tablet  3  . metoprolol (LOPRESSOR) 50 MG tablet Take 1.5 tablets (75 mg total) by mouth 2 (two) times daily.      . Misc Natural Products (CALCIUM PLUS ADVANCED  PO) Take by mouth 2 (two) times daily.        Bertram Gala. Polyethyl Glycol-Propyl Glycol (SYSTANE) 0.4-0.3 % GEL Apply to eye.      . pravastatin (PRAVACHOL) 40 MG tablet Take 0.5 tablets (20 mg total) by mouth daily. As tolerated      . vitamin E 400 UNIT capsule Take 400 Units by mouth daily.       No current facility-administered medications for this visit.   Review of Systems  Constitutional: Negative for chills and fever.  Eyes: Negative for loss of vision.  Respiratory: Negative for cough and wheezing.  Cardiovascular: Negative for chest pain, chest tightness, claudication, dyspnea with exertion, orthopnea and palpitations.  GI: Negative for blood in stool and vomiting.  GU: Negative for dysuria and hematuria.  Musculoskeletal: Negative for leg pain, joint pain and myalgias.  Skin: Negative for rash and wound.  Neurological: Positive for dizziness and focal  weakness. Negative for numbness and speech difficulty.  Hematologic: Negative for bruises/bleeds easily. Psychiatric: Negative for depressed mood.       Objective:  Objective  Filed Vitals:   12/31/13 0939 12/31/13 0942  BP: 158/65 144/66  Pulse: 56   Height: 5' 4.5" (1.638 m)   Weight: 122 lb 12.8 oz (55.702 kg)   SpO2: 98%    Body mass index is 20.76 kg/(m^2).  Physical Exam  Constitutional: She is oriented to person, place, and time. She appears well-developed and well-nourished.  HENT:  Head: Normocephalic and atraumatic.  Neck: Neck supple. No JVD present. No thyromegaly present.  Cardiovascular: Normal rate, regular rhythm and normal heart sounds.  Exam reveals no friction rub.   No murmur heard. Pulses:      Carotid pulses are on the right side with bruit.      Radial pulses are 2+ on the right side, and 2+ on the left side.       Femoral pulses are 2+ on the right side, and 2+ on the left side. Pulmonary/Chest: Breath sounds normal. She has no wheezes. She has no rales.  Abdominal: Soft. Bowel sounds are normal. There is no tenderness.  Musculoskeletal: She exhibits no edema.  Lymphadenopathy:    She has no cervical adenopathy.  Neurological: She is alert and oriented to person, place, and time.  Reflex Scores:      Tricep reflexes are 3+ on the left side.      Bicep reflexes are 3+ on the left side.      Achilles reflexes are 3+ on the left side. Skin: No rash noted.  Psychiatric: She has a normal mood and affect.   DATA: CAROTID DUPLEX: I reviewed her carotid duplex scan which shows a greater than 80% right carotid stenosis with occluded left internal carotid artery. Both vertebral arteries are patent with antegrade flow.  CT of the head on 12/30/2013 shows chronic right middle cerebral artery territory ischemia with no acute intracranial abnormality. There is extensive calcified atherosclerosis of the internal carotid artery siphons.      Assessment/Plan:      Occlusion and stenosis of carotid artery without mention of cerebral infarction This patient has an occluded left internal carotid artery with a very tight right internal carotid artery stenosis. The middle internal carotid artery has a peak systolic velocity of 743 cm/s with end-diastolic velocity 150 cm/s. He is having problems with unsteady gait and dizziness which could be related to cerebral hypo-profusion based on her severe carotid disease. I have recommended right carotid endarterectomy which  should help with her dizziness and also lower her risk of future stroke. I have reviewed the indications for carotid endarterectomy, that is to lower the risk of future stroke. I have also reviewed the potential complications of surgery, including but not limited to: bleeding, stroke (perioperative risk 1-2%), MI, nerve injury of other unpredictable medical problems. All of the patients questions were answered and they are agreeable to proceed with surgery.  Her surgery is scheduled for 01/06/2014. I have instructed her to begin taking aspirin. Unfortunately she does not take her statin consistently because she does not tolerate this.    I have discussed with the patient the nature of atherosclerosis and emphasized the importance of maximal medical management including control of blood pressure, blood glucose and cholesterol levels, antiplatelet agents, obtaining regular exercise, and cessation of smoking. The patient is aware that without maximal medical management the underlying atherosclerotic disease process with progress and also limit the benefit of any interventions.   For [facility] Use Only   PRE-ADM LIVING: [facility] ] Home, [ ]  Nursing home, [ ]  Homeless  AMB STATUS: [facility] ] Walking, [ ]  Walking w/ Assistance, [ ]  Wheelchair, [ ] Bed ridden  RECENT HEART ATTACK (<6 mon): No  CAD Sx: [facility] ] No, [ ]  Asx, h/o MI, [ ]  Stable angina, [ ]  Unstable angina  PRIOR CHF: [facility] ] No, [ ]  Asx, [ ]  Mild, [ ]  Moderate, [ ]   Severe  STRESS TEST: [facility] ] No, [ ]  Normal, [ ]  + ischemia, [ ]  + MI, [ ]  Both  ASA: Was not on aspirin. Started TODAY.    Statin: The patient is on protocol. However she takes this intermittently because of muscle pain.    Chuck Hint MD [facility] #

## 2014-01-07 ENCOUNTER — Telehealth: Payer: Self-pay | Admitting: Vascular Surgery

## 2014-01-07 ENCOUNTER — Encounter (HOSPITAL_COMMUNITY): Payer: Self-pay | Admitting: Vascular Surgery

## 2014-01-07 LAB — BASIC METABOLIC PANEL
BUN: 15 mg/dL (ref 6–23)
CO2: 23 mEq/L (ref 19–32)
Calcium: 9 mg/dL (ref 8.4–10.5)
Chloride: 107 mEq/L (ref 96–112)
Creatinine, Ser: 0.84 mg/dL (ref 0.50–1.10)
GFR calc Af Amer: 76 mL/min — ABNORMAL LOW (ref >90)
GFR, EST NON AFRICAN AMERICAN: 66 mL/min — AB (ref >90)
GLUCOSE: 115 mg/dL — AB (ref 70–99)
Potassium: 3.8 mEq/L (ref 3.7–5.3)
SODIUM: 141 meq/L (ref 137–147)

## 2014-01-07 LAB — CBC
HCT: 35.8 % — ABNORMAL LOW (ref 36.0–46.0)
HEMOGLOBIN: 11.8 g/dL — AB (ref 12.0–15.0)
MCH: 29.9 pg (ref 26.0–34.0)
MCHC: 33 g/dL (ref 30.0–36.0)
MCV: 90.9 fL (ref 78.0–100.0)
PLATELETS: 143 10*3/uL — AB (ref 150–400)
RBC: 3.94 MIL/uL (ref 3.87–5.11)
RDW: 13.9 % (ref 11.5–15.5)
WBC: 7.1 10*3/uL (ref 4.0–10.5)

## 2014-01-07 MED ORDER — ENOXAPARIN SODIUM 40 MG/0.4ML ~~LOC~~ SOLN
40.0000 mg | SUBCUTANEOUS | Status: DC
  Filled 2014-01-07: qty 0.4

## 2014-01-07 NOTE — Telephone Encounter (Addendum)
Message copied by Fredrich BirksMILLIKAN, DANA P on Wed Jan 07, 2014  9:13 AM ------      Message from: Sharee PimpleMCCHESNEY, MARILYN K      Created: Tue Jan 06, 2014 10:54 AM      Regarding: Schedule       Duplicate but he verifies 2 weeks for postop            ----- Message -----         From: Chuck Hinthristopher S Dickson, MD         Sent: 01/06/2014  10:08 AM           To: [facility] Charge Pool      Subject: charge                                                   The patient had a right carotid endarterectomy with Dacron patch angioplasty. Asst. Doreatha MassedSamantha Rhyne, PA. She needs a follow up visit in 2 weeks. Thank you. CD       ------  01/07/14: lm for pt re appt, dpm

## 2014-01-07 NOTE — Discharge Summary (Signed)
[facility] Discharge Summary  Jacqueline Cline 11-Mar-1935 79 y.o. female  [id number]  Admission Date: 01/06/2014  Discharge Date: 01/07/14  Physician: Chuck Hint, MD  Admission Diagnosis: Carotid stenosis   HPI:   This is a 79 y.o. female who has had some problems with unsteady gait and dizziness recently. This prompted a carotid duplex scan that was done at [place] which showed an occlusion of the left internal carotid artery and a tight stenosis on the right. The patient was sent for vascular consultation today. She is right-handed. She denies any history of stroke, TIAs, expressive or receptive aphasia. However, she states that something occurred at birth and she is always had some weakness in the left upper extremity and left lower extremity.  She is currently on Pravachol, however, she does not take this routinely because she has problems with muscle pain when she takes her statin. She is not on aspirin per  Her risk factors for peripheral vascular disease include hypertension, hypercholesterolemia, and a history of premature cardiovascular disease. She denies any history of diabetes. She has a remote history of tobacco use.   Hospital Course:  The patient was admitted to the hospital and taken to the operating room on 01/06/2014 and underwent right carotid endarterectomy.  The pt tolerated the procedure well and was transported to the PACU in good condition.   By POD 1, the pt neuro status was in tact.  She did require some dopamine for blood pressure support, but this was weaned successfully.  The remainder of the hospital course consisted of increasing mobilization and increasing intake of solids without difficulty.    Recent Labs  01/07/14 0255  NA 141  K 3.8  CL 107  CO2 23  GLUCOSE 115*  BUN 15  CALCIUM 9.0    Recent Labs  01/07/14 0255  WBC 7.1  HGB 11.8*  HCT 35.8*  PLT 143*   No results found for this basename: INR,  in  the last 72 hours  Discharge Instructions:   The patient is discharged to home with extensive instructions on wound care and progressive ambulation.  They are instructed not to drive or perform any heavy lifting until returning to see the physician in his office.  Discharge Orders   Future Appointments Provider Department Dept Phone   02/07/2014 9:30 AM Chuck Hint, MD [facility] [phone]   Future Orders Complete By Expires   Call MD for:  redness, tenderness, or signs of infection (pain, swelling, bleeding, redness, odor or green/yellow discharge around incision site)  As directed    Call MD for:  severe or increased pain, loss or decreased feeling  in affected limb(s)  As directed    Call MD for:  temperature >100.5  As directed    CAROTID Sugery: Call MD for difficulty swallowing or speaking; weakness in arms or legs that is a new symtom; severe headache.  If you have increased swelling in the neck and/or  are having difficulty breathing, CALL 911  As directed    Discharge wound care:  As directed    Comments:     Shower daily with soap and water starting 01/08/14   Driving Restrictions  As directed    Comments:     No driving for 2 weeks   Lifting restrictions  As directed    Comments:     No lifting for 2 weeks   Resume previous diet  As directed  Discharge Diagnosis:  Carotid stenosis  Secondary Diagnosis: Patient Active Problem List   Diagnosis Date Noted  . Carotid artery stenosis 01/06/2014  . Occlusion and stenosis of carotid artery without mention of cerebral infarction 12/31/2013  . CVA (cerebral infarction) 12/30/2013  . Hypervitaminosis d 04/10/2013  . Murmur 04/08/2012  . Vision changes 04/08/2012  . Medicare annual wellness visit, subsequent 03/08/2012  . Advance directive discussed with patient 03/08/2012  . HYPERLIPIDEMIA 12/07/2004  . HYPERTENSION 03/09/2004   Past Medical History  Diagnosis Date  .  Hyperlipidemia 12/07/2004  . Hypertension 03/09/2004  . Carotid artery occlusion   . Pneumonia     hx  . Arthritis   . Heart murmur     mild AS/MR, mod TR 12/2013 echo      Medication List         beta carotene w/minerals tablet  Take 1 tablet by mouth daily.     CALCIUM PLUS ADVANCED PO  Take 1 tablet by mouth 2 (two) times daily.     Co Q 10 100 MG Caps  Take 1 capsule by mouth 2 (two) times daily.     Cod Liver Oil Caps  Take 1 capsule by mouth 2 (two) times daily.     lisinopril 20 MG tablet  Commonly known as:  PRINIVIL,ZESTRIL  Take 20 mg by mouth 2 (two) times daily.     metoprolol 50 MG tablet  Commonly known as:  LOPRESSOR  Take 50 mg by mouth 2 (two) times daily.     oxyCODONE 5 MG immediate release tablet  Commonly known as:  ROXICODONE  Take 1 tablet (5 mg total) by mouth every 6 (six) hours as needed for severe pain.     pravastatin 40 MG tablet  Commonly known as:  PRAVACHOL  Take 20 mg by mouth daily.     SYSTANE 0.4-0.3 % Gel  Generic drug:  Polyethyl Glycol-Propyl Glycol  Place 1 drop into both eyes at bedtime as needed (for dry eyes).     vitamin C 1000 MG tablet  Take 1,000 mg by mouth daily.     vitamin E 400 UNIT capsule  Take 400 Units by mouth daily.        Roxicodone #30 No Refill  Disposition: home  Patient's condition: is Good  Follow up: 1. Dr.  Edilia Bo in 2 weeks.   Doreatha Massed, PA-C [facility] [phone]  --- For [facility] use --- Instructions: Press F2 to tab through selections.  Delete question if not applicable.   Modified Rankin score at D/C (0-6): 1  IV medication needed for:  1. Hypertension: No 2. Hypotension: Yes  Post-op Complications: No  1. Post-op CVA or TIA: No  If yes: Event classification (right eye, left eye, right cortical, left cortical, verterobasilar, other): n/a  If yes: Timing of event (intra-op, <6 hrs post-op, >=6 hrs post-op, unknown): n/a  2. CN  injury: No  If yes: CN n/a injuried   3. Myocardial infarction: No  If yes: Dx by (EKG or clinical, Troponin): n/a  4.  CHF: No  5.  Dysrhythmia (new): No  6. Wound infection: No  7. Reperfusion symptoms: No  8. Return to OR: No  If yes: return to OR for (bleeding, neurologic, other CEA incision, other): n/a  Discharge medications: Statin use:  No If No: [x ] For Medical reasons-does not tolerate, [ ]  Non-compliant, [ ]  Not-indicated ASA use:  No  If No: [ x] For Medical  reasons, [ ]  Non-compliant, [ ]  Not-indicated Beta blocker use:  Yes If No: [ ]  For Medical reasons, [ ]  Non-compliant, [ ]  Not-indicated ACE-Inhibitor use:  Yes If No: [ ]  For Medical reasons, [ ]  Non-compliant, [ ]  Not-indicated P2Y12 Antagonist use: No, [ ]  Plavix, [ ]  Plasugrel, [ ]  Ticlopinine, [ ]  Ticagrelor, [ ]  Other, [ ]  No for medical reason, [ ]  Non-compliant, [ ]  Not-indicated Anti-coagulant use:  No, [ ]  Warfarin, [ ]  Rivaroxaban, [ ]  Dabigatran, [ ]  Other, [ ]  No for medical reason, [ ]  Non-compliant, [ ]  Not-indicated

## 2014-01-07 NOTE — Progress Notes (Signed)
Chaplain checked in with pt who was sitting up and looking out her door. She stated she is doing well after having surgery yesterday. Pt shared about her nephew who was a Orthoptistchaplain in the army but is now deceased. Pt expects to go home soon.

## 2014-01-07 NOTE — Progress Notes (Signed)
   VASCULAR PROGRESS NOTE  SUBJECTIVE: No complaints  PHYSICAL EXAM: Filed Vitals:   01/07/14 0100 01/07/14 0200 01/07/14 0300 01/07/14 0400  BP: 120/44 98/39 121/41 116/50  Pulse: 64 66 60 58  Temp:    99.3 F (37.4 C)  TempSrc:    Oral  Resp: 15 14 15 13   Height:      Weight:      SpO2: 93% 96% 93% 91%   Incision looks fine Neuro baseline  LABS: Lab Results  Component Value Date   WBC 7.1 01/07/2014   HGB 11.8* 01/07/2014   HCT 35.8* 01/07/2014   MCV 90.9 01/07/2014   PLT 143* 01/07/2014   Lab Results  Component Value Date   CREATININE 0.84 01/07/2014   Lab Results  Component Value Date   INR 0.94 01/02/2014   CBG (last 3)  No results found for this basename: GLUCAP,  in the last 72 hours  Active Problems:   Carotid artery stenosis   ASSESSMENT AND PLAN:  * 1 Day Post-Op s/p: L CEA  *  Home today once weened off of Dopamine (only on 3 mcg/kg/min)  * F/U 2 weeks  * VQI not taking statin because doesn't tolerate it. Is on ASA.   Cari CarawayChris Londynn Sonoda Beeper: [phone]: [phone] 01/07/2014

## 2014-01-07 NOTE — Progress Notes (Signed)
Have held 10AM Lopressor dose this morning due to parimeters being close to order as written. Pulse=68,SBP=118. Discussed with charge nurse and she agreed to hold dose this AM. Will instruct patient to resume med as ordered on discharge instructions. Son present and aware of above.

## 2014-01-07 NOTE — Discharge Summary (Signed)
Agree with plans for discharge today.  Jacqueline Ferrarihristopher Dickson, MD, FACS Beeper [phone] 01/07/2014

## 2014-01-07 NOTE — Progress Notes (Signed)
Patient has been discharged home at this time via wheelchair. Charge nurse has reviewed discharge instructions with patient and her son. All questions and concerns have been addressed.

## 2014-01-08 ENCOUNTER — Telehealth: Payer: Self-pay

## 2014-01-08 NOTE — Telephone Encounter (Signed)
Phone call from pt's son, [name].  Reported "my mother had episode of feeling warm in the middle of night, and felt like her heart was beating fast."  Called pt. to discuss symptoms.  Stated she had approx. 30 min. episode of getting warm during the night, and could feel her heart beating faster.  Denies using any narcotic pain medications.  Stated she is only taking Tylenol for pain.  Denies any shortness of breath, dysphagia, or headaches.  Stated incisional area looks fine; denies drainage or inflammation.  Encouraged pt. to stay well-hydrated.  Encouraged to monitor for re-occurance of symptoms, and to report to office.  Verb. understanding.

## 2014-01-09 ENCOUNTER — Other Ambulatory Visit (HOSPITAL_COMMUNITY): Payer: Medicare Other

## 2014-01-14 ENCOUNTER — Other Ambulatory Visit: Payer: Self-pay | Admitting: Family Medicine

## 2014-01-14 NOTE — Telephone Encounter (Signed)
Received refill request electronically from pharmacy. Refill request does not match medication list which shows that patient is taking 1/2 of the Pravastatin 40 mg. See allergy/contraindication. Is it okay to refill medication?

## 2014-01-14 NOTE — Telephone Encounter (Signed)
I would try to take the 1/2 tab a day as tolerated, given her history.  If she has aches, then stop the medicine for a few days.  Thanks.

## 2014-01-14 NOTE — Telephone Encounter (Signed)
Left message on answering machine for patient to call back.  

## 2014-01-14 NOTE — Telephone Encounter (Signed)
Relayed message to patient about what Dr. Para Marchuncan had said. Pt says she is currently doing that and she says sometimes it works and sometimes its does not. She is going to Dr. Durwin Noraixon on Friday and says she will know more after she sees him.

## 2014-01-15 ENCOUNTER — Encounter: Payer: Self-pay | Admitting: Family

## 2014-01-16 ENCOUNTER — Emergency Department (HOSPITAL_COMMUNITY): Payer: Medicare Other

## 2014-01-16 ENCOUNTER — Inpatient Hospital Stay (HOSPITAL_COMMUNITY): Payer: Medicare Other

## 2014-01-16 ENCOUNTER — Inpatient Hospital Stay (HOSPITAL_COMMUNITY)
Admission: EM | Admit: 2014-01-16 | Discharge: 2014-02-06 | DRG: 064 | Disposition: E | Payer: Medicare Other | Attending: Internal Medicine | Admitting: Internal Medicine

## 2014-01-16 ENCOUNTER — Encounter: Payer: Medicare Other | Admitting: Family

## 2014-01-16 ENCOUNTER — Encounter (HOSPITAL_COMMUNITY): Payer: Self-pay | Admitting: Radiology

## 2014-01-16 DIAGNOSIS — G40901 Epilepsy, unspecified, not intractable, with status epilepticus: Secondary | ICD-10-CM

## 2014-01-16 DIAGNOSIS — R778 Other specified abnormalities of plasma proteins: Secondary | ICD-10-CM

## 2014-01-16 DIAGNOSIS — D638 Anemia in other chronic diseases classified elsewhere: Secondary | ICD-10-CM | POA: Diagnosis present

## 2014-01-16 DIAGNOSIS — I2489 Other forms of acute ischemic heart disease: Secondary | ICD-10-CM | POA: Diagnosis present

## 2014-01-16 DIAGNOSIS — I248 Other forms of acute ischemic heart disease: Secondary | ICD-10-CM | POA: Diagnosis present

## 2014-01-16 DIAGNOSIS — M129 Arthropathy, unspecified: Secondary | ICD-10-CM | POA: Diagnosis present

## 2014-01-16 DIAGNOSIS — R7989 Other specified abnormal findings of blood chemistry: Secondary | ICD-10-CM

## 2014-01-16 DIAGNOSIS — I10 Essential (primary) hypertension: Secondary | ICD-10-CM | POA: Diagnosis present

## 2014-01-16 DIAGNOSIS — I4891 Unspecified atrial fibrillation: Secondary | ICD-10-CM | POA: Diagnosis not present

## 2014-01-16 DIAGNOSIS — E785 Hyperlipidemia, unspecified: Secondary | ICD-10-CM | POA: Diagnosis present

## 2014-01-16 DIAGNOSIS — I639 Cerebral infarction, unspecified: Secondary | ICD-10-CM

## 2014-01-16 DIAGNOSIS — Z7189 Other specified counseling: Secondary | ICD-10-CM

## 2014-01-16 DIAGNOSIS — R569 Unspecified convulsions: Secondary | ICD-10-CM | POA: Diagnosis present

## 2014-01-16 DIAGNOSIS — Z515 Encounter for palliative care: Secondary | ICD-10-CM

## 2014-01-16 DIAGNOSIS — J189 Pneumonia, unspecified organism: Secondary | ICD-10-CM | POA: Diagnosis present

## 2014-01-16 DIAGNOSIS — E876 Hypokalemia: Secondary | ICD-10-CM | POA: Diagnosis present

## 2014-01-16 DIAGNOSIS — J40 Bronchitis, not specified as acute or chronic: Secondary | ICD-10-CM | POA: Diagnosis present

## 2014-01-16 DIAGNOSIS — D72829 Elevated white blood cell count, unspecified: Secondary | ICD-10-CM | POA: Diagnosis present

## 2014-01-16 DIAGNOSIS — I6350 Cerebral infarction due to unspecified occlusion or stenosis of unspecified cerebral artery: Secondary | ICD-10-CM

## 2014-01-16 DIAGNOSIS — G40401 Other generalized epilepsy and epileptic syndromes, not intractable, with status epilepticus: Secondary | ICD-10-CM | POA: Diagnosis present

## 2014-01-16 DIAGNOSIS — I63239 Cerebral infarction due to unspecified occlusion or stenosis of unspecified carotid arteries: Principal | ICD-10-CM | POA: Diagnosis present

## 2014-01-16 DIAGNOSIS — Z823 Family history of stroke: Secondary | ICD-10-CM

## 2014-01-16 DIAGNOSIS — Z7982 Long term (current) use of aspirin: Secondary | ICD-10-CM

## 2014-01-16 DIAGNOSIS — Z8249 Family history of ischemic heart disease and other diseases of the circulatory system: Secondary | ICD-10-CM

## 2014-01-16 DIAGNOSIS — G9782 Other postprocedural complications and disorders of nervous system: Secondary | ICD-10-CM | POA: Diagnosis present

## 2014-01-16 DIAGNOSIS — I959 Hypotension, unspecified: Secondary | ICD-10-CM | POA: Diagnosis present

## 2014-01-16 DIAGNOSIS — Z66 Do not resuscitate: Secondary | ICD-10-CM

## 2014-01-16 DIAGNOSIS — J9600 Acute respiratory failure, unspecified whether with hypoxia or hypercapnia: Secondary | ICD-10-CM | POA: Diagnosis present

## 2014-01-16 DIAGNOSIS — Z87891 Personal history of nicotine dependence: Secondary | ICD-10-CM

## 2014-01-16 DIAGNOSIS — R7309 Other abnormal glucose: Secondary | ICD-10-CM | POA: Diagnosis present

## 2014-01-16 DIAGNOSIS — N179 Acute kidney failure, unspecified: Secondary | ICD-10-CM | POA: Diagnosis present

## 2014-01-16 DIAGNOSIS — I6529 Occlusion and stenosis of unspecified carotid artery: Secondary | ICD-10-CM

## 2014-01-16 DIAGNOSIS — R4020 Unspecified coma: Secondary | ICD-10-CM

## 2014-01-16 DIAGNOSIS — I2510 Atherosclerotic heart disease of native coronary artery without angina pectoris: Secondary | ICD-10-CM | POA: Diagnosis present

## 2014-01-16 DIAGNOSIS — G9349 Other encephalopathy: Secondary | ICD-10-CM | POA: Diagnosis present

## 2014-01-16 LAB — URINE MICROSCOPIC-ADD ON

## 2014-01-16 LAB — URINALYSIS, ROUTINE W REFLEX MICROSCOPIC
Bilirubin Urine: NEGATIVE
Glucose, UA: NEGATIVE mg/dL
Hgb urine dipstick: NEGATIVE
KETONES UR: NEGATIVE mg/dL
Leukocytes, UA: NEGATIVE
NITRITE: NEGATIVE
PROTEIN: 30 mg/dL — AB
Specific Gravity, Urine: 1.022 (ref 1.005–1.030)
Urobilinogen, UA: 0.2 mg/dL (ref 0.0–1.0)
pH: 5.5 (ref 5.0–8.0)

## 2014-01-16 LAB — I-STAT ARTERIAL BLOOD GAS, ED
Acid-Base Excess: 1 mmol/L (ref 0.0–2.0)
BICARBONATE: 26.5 meq/L — AB (ref 20.0–24.0)
O2 SAT: 100 %
Patient temperature: 97.4
TCO2: 28 mmol/L (ref 0–100)
pCO2 arterial: 45.8 mmHg — ABNORMAL HIGH (ref 35.0–45.0)
pH, Arterial: 7.368 (ref 7.350–7.450)
pO2, Arterial: 220 mmHg — ABNORMAL HIGH (ref 80.0–100.0)

## 2014-01-16 LAB — COMPREHENSIVE METABOLIC PANEL
ALBUMIN: 3.5 g/dL (ref 3.5–5.2)
ALT: 172 U/L — ABNORMAL HIGH (ref 0–35)
AST: 170 U/L — AB (ref 0–37)
Alkaline Phosphatase: 74 U/L (ref 39–117)
BILIRUBIN TOTAL: 0.4 mg/dL (ref 0.3–1.2)
BUN: 20 mg/dL (ref 6–23)
CHLORIDE: 103 meq/L (ref 96–112)
CO2: 26 mEq/L (ref 19–32)
CREATININE: 0.95 mg/dL (ref 0.50–1.10)
Calcium: 10.1 mg/dL (ref 8.4–10.5)
GFR calc Af Amer: 66 mL/min — ABNORMAL LOW (ref >90)
GFR calc non Af Amer: 57 mL/min — ABNORMAL LOW (ref >90)
Glucose, Bld: 164 mg/dL — ABNORMAL HIGH (ref 70–99)
Potassium: 4.1 mEq/L (ref 3.7–5.3)
Sodium: 144 mEq/L (ref 137–147)
TOTAL PROTEIN: 7.1 g/dL (ref 6.0–8.3)

## 2014-01-16 LAB — I-STAT CHEM 8, ED
BUN: 19 mg/dL (ref 6–23)
CHLORIDE: 103 meq/L (ref 96–112)
Calcium, Ion: 1.39 mmol/L — ABNORMAL HIGH (ref 1.13–1.30)
Creatinine, Ser: 1.2 mg/dL — ABNORMAL HIGH (ref 0.50–1.10)
Glucose, Bld: 163 mg/dL — ABNORMAL HIGH (ref 70–99)
HCT: 43 % (ref 36.0–46.0)
Hemoglobin: 14.6 g/dL (ref 12.0–15.0)
POTASSIUM: 3.9 meq/L (ref 3.7–5.3)
SODIUM: 142 meq/L (ref 137–147)
TCO2: 28 mmol/L (ref 0–100)

## 2014-01-16 LAB — CBC
HCT: 42.2 % (ref 36.0–46.0)
Hemoglobin: 14 g/dL (ref 12.0–15.0)
MCH: 30.3 pg (ref 26.0–34.0)
MCHC: 33.2 g/dL (ref 30.0–36.0)
MCV: 91.3 fL (ref 78.0–100.0)
Platelets: 247 10*3/uL (ref 150–400)
RBC: 4.62 MIL/uL (ref 3.87–5.11)
RDW: 13.5 % (ref 11.5–15.5)
WBC: 18.9 10*3/uL — ABNORMAL HIGH (ref 4.0–10.5)

## 2014-01-16 LAB — APTT: APTT: 22 s — AB (ref 24–37)

## 2014-01-16 LAB — PROTIME-INR
INR: 0.92 (ref 0.00–1.49)
Prothrombin Time: 12.2 seconds (ref 11.6–15.2)

## 2014-01-16 LAB — DIFFERENTIAL
BASOS ABS: 0 10*3/uL (ref 0.0–0.1)
BASOS PCT: 0 % (ref 0–1)
Eosinophils Absolute: 0 10*3/uL (ref 0.0–0.7)
Eosinophils Relative: 0 % (ref 0–5)
Lymphocytes Relative: 5 % — ABNORMAL LOW (ref 12–46)
Lymphs Abs: 1 10*3/uL (ref 0.7–4.0)
MONO ABS: 1.2 10*3/uL — AB (ref 0.1–1.0)
Monocytes Relative: 6 % (ref 3–12)
NEUTROS ABS: 16.7 10*3/uL — AB (ref 1.7–7.7)
Neutrophils Relative %: 89 % — ABNORMAL HIGH (ref 43–77)

## 2014-01-16 LAB — I-STAT TROPONIN, ED: Troponin i, poc: 0.37 ng/mL (ref 0.00–0.08)

## 2014-01-16 LAB — RAPID URINE DRUG SCREEN, HOSP PERFORMED
AMPHETAMINES: NOT DETECTED
BARBITURATES: NOT DETECTED
Benzodiazepines: POSITIVE — AB
Cocaine: NOT DETECTED
Opiates: NOT DETECTED
TETRAHYDROCANNABINOL: NOT DETECTED

## 2014-01-16 LAB — CBG MONITORING, ED: Glucose-Capillary: 175 mg/dL — ABNORMAL HIGH (ref 70–99)

## 2014-01-16 LAB — MRSA PCR SCREENING: MRSA BY PCR: NEGATIVE

## 2014-01-16 LAB — CK: Total CK: 242 U/L — ABNORMAL HIGH (ref 7–177)

## 2014-01-16 LAB — ETHANOL

## 2014-01-16 LAB — TROPONIN I: Troponin I: 1.02 ng/mL (ref <0.30)

## 2014-01-16 LAB — TRIGLYCERIDES: TRIGLYCERIDES: 95 mg/dL (ref <150)

## 2014-01-16 MED ORDER — FAMOTIDINE IN NACL 20-0.9 MG/50ML-% IV SOLN
20.0000 mg | Freq: Two times a day (BID) | INTRAVENOUS | Status: DC
  Filled 2014-01-16: qty 50

## 2014-01-16 MED ORDER — PROPOFOL 10 MG/ML IV EMUL
INTRAVENOUS | Status: AC
  Administered 2014-01-16: 15 ug/kg/min via INTRAVENOUS
  Filled 2014-01-16: qty 100

## 2014-01-16 MED ORDER — LEVETIRACETAM 500 MG/5ML IV SOLN
500.0000 mg | Freq: Two times a day (BID) | INTRAVENOUS | Status: DC
  Administered 2014-01-16 – 2014-01-17 (×2): 500 mg via INTRAVENOUS
  Filled 2014-01-16 (×5): qty 5

## 2014-01-16 MED ORDER — SODIUM CHLORIDE 0.9 % IV SOLN
INTRAVENOUS | Status: DC
  Administered 2014-01-17 (×2): via INTRAVENOUS
  Administered 2014-01-18: 75 mL via INTRAVENOUS
  Administered 2014-01-19 – 2014-01-21 (×4): via INTRAVENOUS

## 2014-01-16 MED ORDER — PROPOFOL 10 MG/ML IV EMUL
0.0000 ug/kg/min | INTRAVENOUS | Status: DC
  Administered 2014-01-16: 15 ug/kg/min via INTRAVENOUS
  Administered 2014-01-16: 20 ug/kg/min via INTRAVENOUS
  Administered 2014-01-16: 30 ug/kg/min via INTRAVENOUS
  Administered 2014-01-16 (×2): 20 ug/kg/min via INTRAVENOUS
  Administered 2014-01-17 (×2): 35 ug/kg/min via INTRAVENOUS
  Administered 2014-01-17: 25 ug/kg/min via INTRAVENOUS
  Administered 2014-01-18 – 2014-01-19 (×5): 35 ug/kg/min via INTRAVENOUS
  Administered 2014-01-19 – 2014-01-20 (×3): 40 ug/kg/min via INTRAVENOUS
  Administered 2014-01-20 (×2): 30 ug/kg/min via INTRAVENOUS
  Administered 2014-01-21: 15 ug/kg/min via INTRAVENOUS
  Administered 2014-01-21: 35 ug/kg/min via INTRAVENOUS
  Filled 2014-01-16 (×15): qty 100

## 2014-01-16 MED ORDER — SODIUM CHLORIDE 0.9 % IV SOLN
0.5000 mg/h | INTRAVENOUS | Status: DC
  Administered 2014-01-16: 1 mg/h via INTRAVENOUS
  Filled 2014-01-16: qty 10

## 2014-01-16 MED ORDER — BIOTENE DRY MOUTH MT LIQD
15.0000 mL | Freq: Four times a day (QID) | OROMUCOSAL | Status: DC
  Administered 2014-01-17 – 2014-01-19 (×12): 15 mL via OROMUCOSAL

## 2014-01-16 MED ORDER — ETOMIDATE 2 MG/ML IV SOLN
INTRAVENOUS | Status: DC | PRN
  Administered 2014-01-16: 20 mg via INTRAVENOUS

## 2014-01-16 MED ORDER — CHLORHEXIDINE GLUCONATE 0.12 % MT SOLN
15.0000 mL | Freq: Two times a day (BID) | OROMUCOSAL | Status: DC
  Administered 2014-01-16 – 2014-01-19 (×6): 15 mL via OROMUCOSAL
  Filled 2014-01-16 (×6): qty 15

## 2014-01-16 MED ORDER — MIDAZOLAM HCL 2 MG/2ML IJ SOLN
INTRAMUSCULAR | Status: AC
  Filled 2014-01-16: qty 2

## 2014-01-16 MED ORDER — LORAZEPAM 2 MG/ML IJ SOLN
2.0000 mg | Freq: Once | INTRAMUSCULAR | Status: AC
  Administered 2014-01-16: 2 mg via INTRAVENOUS
  Filled 2014-01-16: qty 1

## 2014-01-16 MED ORDER — ASPIRIN 300 MG RE SUPP: 300.0000 mg | RECTAL | Status: AC

## 2014-01-16 MED ORDER — ASPIRIN 81 MG PO CHEW: 324.0000 mg | CHEWABLE_TABLET | ORAL | Status: AC

## 2014-01-16 MED ORDER — ETOMIDATE 2 MG/ML IV SOLN
INTRAVENOUS | Status: AC
  Filled 2014-01-16: qty 20

## 2014-01-16 MED ORDER — LORAZEPAM 2 MG/ML IJ SOLN
INTRAMUSCULAR | Status: AC
  Administered 2014-01-16: 2 mg
  Filled 2014-01-16: qty 1

## 2014-01-16 MED ORDER — ALBUTEROL SULFATE (2.5 MG/3ML) 0.083% IN NEBU
2.5000 mg | INHALATION_SOLUTION | RESPIRATORY_TRACT | Status: DC | PRN
Start: 2014-01-16 — End: 2014-01-27

## 2014-01-16 MED ORDER — HEPARIN SODIUM (PORCINE) 5000 UNIT/ML IJ SOLN
5000.0000 [IU] | Freq: Three times a day (TID) | INTRAMUSCULAR | Status: DC
  Administered 2014-01-16 – 2014-01-26 (×30): 5000 [IU] via SUBCUTANEOUS
  Filled 2014-01-16 (×33): qty 1

## 2014-01-16 MED ORDER — LIDOCAINE HCL (CARDIAC) 20 MG/ML IV SOLN
INTRAVENOUS | Status: AC
  Filled 2014-01-16: qty 5

## 2014-01-16 MED ORDER — SODIUM CHLORIDE 0.9 % IV SOLN
250.0000 mL | INTRAVENOUS | Status: DC | PRN
  Administered 2014-01-24: 1000 mL via INTRAVENOUS
  Administered 2014-01-25: 250 mL via INTRAVENOUS

## 2014-01-16 MED ORDER — FENTANYL CITRATE 0.05 MG/ML IJ SOLN: 50.0000 ug | INTRAMUSCULAR | Status: DC | PRN

## 2014-01-16 MED ORDER — SODIUM CHLORIDE 0.9 % IV SOLN
500.0000 mg | INTRAVENOUS | Status: AC
  Administered 2014-01-16: 500 mg via INTRAVENOUS
  Filled 2014-01-16: qty 5

## 2014-01-16 MED ORDER — SODIUM CHLORIDE 0.9 % IV SOLN
500.0000 mg | Freq: Two times a day (BID) | INTRAVENOUS | Status: DC
  Filled 2014-01-16 (×2): qty 5

## 2014-01-16 MED ORDER — ROCURONIUM BROMIDE 50 MG/5ML IV SOLN
INTRAVENOUS | Status: AC
  Filled 2014-01-16: qty 2

## 2014-01-16 MED ORDER — MIDAZOLAM HCL 2 MG/2ML IJ SOLN
1.0000 mg | Freq: Once | INTRAMUSCULAR | Status: AC
  Administered 2014-01-16: 1 mg via INTRAVENOUS

## 2014-01-16 MED ORDER — ROCURONIUM BROMIDE 50 MG/5ML IV SOLN
INTRAVENOUS | Status: DC | PRN
  Administered 2014-01-16: 70 mg via INTRAVENOUS

## 2014-01-16 MED ORDER — SUCCINYLCHOLINE CHLORIDE 20 MG/ML IJ SOLN
INTRAMUSCULAR | Status: AC
  Filled 2014-01-16: qty 1

## 2014-01-16 MED ORDER — FAMOTIDINE IN NACL 20-0.9 MG/50ML-% IV SOLN
20.0000 mg | INTRAVENOUS | Status: DC
  Administered 2014-01-16 – 2014-01-18 (×3): 20 mg via INTRAVENOUS
  Filled 2014-01-16 (×5): qty 50

## 2014-01-16 NOTE — ED Notes (Signed)
EEG played with computer cords but PT scanning would not open PT chart and medication scan not recognizing in EPIC. Per Steward DroneBrenda, charted by exception

## 2014-01-16 NOTE — ED Notes (Signed)
RR controlled by ventilator and verified by [facility] LPMia RN. Monitor reports 0 RR, but PT is breathing via vent @ 14rr/min. Turned off RR alarm on phillips monitor

## 2014-01-16 NOTE — H&P (Addendum)
PULMONARY / CRITICAL CARE MEDICINE   Name: Jacqueline StablerBarbara F Groft MRN: [id number] DOB: 06/08/1935    ADMISSION DATE:  01/30/2014  REFERRING MD :  EDP  PRIMARY SERVICE: PCCM   CHIEF COMPLAINT:  CVA w AMS / Resp Fx  BRIEF PATIENT DESCRIPTION: 79 y/o F with recent R carotid stent placement who [facility] with AMS & Seizures.  Found to have R MCA CVA, seizures, and acute respiratory failure.    SIGNIFICANT EVENTS:  4/10 - admit with R MCA CVA, seizures, resp failure  STUDIES: 4/10 - CT of head / neck >> acute moderate R MCA CVA without hemorrhagic conversion, remote R basal gaglia & right temporoccipital infarct, severe white matter changes. Neck without acute fracture.  LINES / TUBES: OETT 4/10>>>  CULTURES:   ANTIBIOTICS:   HISTORY OF PRESENT ILLNESS:  79 y/o F with PMH of CAD, HTN, HLD, mild AS/MR, TR, known L carotid occlusion & recent R carotid stent placement 3/31 [facility] with AMS & Seizures.  She was found down at home per family altered and face down. Last seen normal state of health 8 am .  EMS activated and patient was noted to be seizing on arrival, hypertensive (200/100).  She was medicated with 2.5 mg versed and she became unresponsive post administration and was intubated.  She was last seen normal evening prior to presentation.  ER work up with Na 142, K 3.9, Cr 1.20, Trop 0.37, hgb 14.6, INR 0.92, negative urinalysis.  CT of head / neck noted acute moderate R MCA CVA without hemorrhagic conversion, remote R basal gaglia & right temporoccipital infarct, severe white matter changes. Neck without acute fracture.  PCCM called for ICU admission.    PAST MEDICAL HISTORY :  Past Medical History  Diagnosis Date  . Hyperlipidemia 12/07/2004  . Hypertension 03/09/2004  . Carotid artery occlusion   . Pneumonia     hx  . Arthritis   . Heart murmur     mild AS/MR, mod TR 12/2013 echo   Past Surgical History  Procedure Laterality Date  . Orif  ankle fracture  1995    Right ankle fracture  . Normal vaginal delivery  1962  . Eye surgery      cataract removed OD 01/05/2005//cataract Removed OS 03/14/2005  . Endarterectomy Right 01/06/2014    Procedure: RIGHT CAROTID ENDARTERECTOMY;  Surgeon: Chuck Hinthristopher S Dickson, MD;  [facility];  Service: Vascular;  Laterality: Right;   Prior to Admission medications   Medication Sig Start Date End Date Taking? Authorizing Provider  acetaminophen (TYLENOL) 325 MG tablet Take 650 mg by mouth every 6 (six) hours as needed for moderate pain.   Yes Historical Provider, MD  Ascorbic Acid (VITAMIN C) 1000 MG tablet Take 1,000 mg by mouth daily.     Yes Historical Provider, MD  aspirin 81 MG tablet Take 81 mg by mouth daily.   Yes Historical Provider, MD  beta carotene w/minerals (OCUVITE) tablet Take 1 tablet by mouth daily.   Yes Historical Provider, MD  [facility] Oil CAPS Take 1 capsule by mouth 2 (two) times daily.    Yes Historical Provider, MD  Coenzyme Q10 (CO Q 10) 100 MG CAPS Take 1 capsule by mouth 2 (two) times daily.    Yes Historical Provider, MD  lisinopril (PRINIVIL,ZESTRIL) 20 MG tablet Take 20 mg by mouth 2 (two) times daily.   Yes Historical Provider, MD  metoprolol (LOPRESSOR) 50 MG  tablet Take 50 mg by mouth 2 (two) times daily.   Yes Historical Provider, MD  Misc Natural Products (CALCIUM PLUS ADVANCED PO) Take 1 tablet by mouth 2 (two) times daily.    Yes Historical Provider, MD  oxyCODONE (ROXICODONE) 5 MG immediate release tablet Take 1 tablet (5 mg total) by mouth every 6 (six) hours as needed for severe pain. 01/06/14  Yes Samantha J Rhyne, PA-C  Polyethyl Glycol-Propyl Glycol (SYSTANE) 0.4-0.3 % GEL Place 1 drop into both eyes at bedtime as needed (for dry eyes).    Yes Historical Provider, MD  pravastatin (PRAVACHOL) 40 MG tablet Take 20 mg by mouth daily.   Yes Historical Provider, MD  vitamin E 400 UNIT capsule Take 400 Units by mouth daily.   Yes Historical Provider, MD    Allergies  Allergen Reactions  . Amlodipine     Light headed, trouble walking  . Simvastatin     myalgias    FAMILY HISTORY:  Family History  Problem Relation Age of Onset  . Heart disease Mother 25    CABG, POSTOP  . Heart disease Father 21    MI : Depression  . Heart attack Father   . Hypertension Sister     hyperthyroid- Removed // Decreased hearing  . Cancer Paternal Uncle     unknown type  . Stroke Maternal Grandmother   . Diabetes Neg Hx   . Alcohol abuse Neg Hx   . Colon cancer Neg Hx   . Breast cancer Neg Hx    SOCIAL HISTORY:  reports that she quit smoking about 50 years ago. Her smoking use included Cigarettes. She has a .5 pack-year smoking history. She quit smokeless tobacco use about 50 years ago. She reports that she does not drink alcohol or use illicit drugs.  REVIEW OF SYSTEMS:  Unable to complete with patient as she is altered on mechanical ventilation.    SUBJECTIVE:   VITAL SIGNS: Temp:  [97.4 F (36.3 C)] 97.4 F (36.3 C) (04/10 1345) Pulse Rate:  [86-93] 89 (04/10 1415) Resp:  [12-20] 14 (04/10 1425) BP: (171-211)/(71-99) 211/99 mmHg (04/10 1415) SpO2:  [98 %-100 %] 100 % (04/10 1406) FiO2 (%):  [100 %] 100 % (04/10 1415)  HEMODYNAMICS:    VENTILATOR SETTINGS: Vent Mode:  [-]  FiO2 (%):  [100 %] 100 %  INTAKE / OUTPUT: Intake/Output   None     PHYSICAL EXAMINATION: General:  Elderly female on vent, NAD Neuro:  Abnormal eye movements, pupils 3mm =R, seizures on RUE, LUE no movement HEENT:  OETT Cardiovascular:  s1s2 rrr, hypertensive  Lungs:  resp's even/non-labored on vent, lungs bilaterally coarse Abdomen:  Round/soft, bsx4 active  Musculoskeletal:  No acute deformties Skin:  Warm/dry, no edema  LABS:  CBC  Recent Labs Lab 01/23/2014 1402 02/04/2014 1423  WBC 18.9*  --   HGB 14.0 14.6  HCT 42.2 43.0  PLT 247  --    Coag's  Recent Labs Lab 02/02/2014 1402  APTT 22*  INR 0.92   BMET  Recent Labs Lab  01/19/2014 1402 01/11/2014 1423  NA 144 142  K 4.1 3.9  CL 103 103  CO2 26  --   BUN 20 19  CREATININE 0.95 1.20*  GLUCOSE 164* 163*   Electrolytes  Recent Labs Lab 01/13/2014 1402  CALCIUM 10.1   Sepsis Markers No results found for this basename: LATICACIDVEN, PROCALCITON, O2SATVEN,  in the last 168 hours ABG  Recent Labs Lab 01/13/2014 1434  PHART  7.368  PCO2ART 45.8*  PO2ART 220.0*   Liver Enzymes  Recent Labs Lab Jan 22, 2014 1402  AST 170*  ALT 172*  ALKPHOS 74  BILITOT 0.4  ALBUMIN 3.5   Cardiac Enzymes No results found for this basename: TROPONINI, PROBNP,  in the last 168 hours Glucose  Recent Labs Lab 01/22/14 1351  GLUCAP 175*    Imaging Ct Head Wo Contrast  2014/01/22   CLINICAL DATA:  Left-sided seizures, found down.  Left hemi paresis.  EXAM: CT HEAD WITHOUT CONTRAST  CT CERVICAL SPINE WITHOUT CONTRAST  TECHNIQUE: Multidetector CT imaging of the head and cervical spine was performed following the standard protocol without intravenous contrast. Multiplanar CT image reconstructions of the cervical spine were also generated.  COMPARISON:  CT HEAD W/O CM dated 12/30/2013  FINDINGS: CT HEAD FINDINGS  Right temporal loss of the great matter junction, axial 12/32, with equivocal right hyperdense MCA.  No acute intraparenchymal hemorrhage, mass effect or midline shift. Wedge-like hypodensity in right temporal occipital lobe appears somewhat worse though, this could be due to slice selection. Remote right basal ganglia/ corona radiata infarct with ex vacuo dilatation right frontal horn/lateral ventricle again seen. Subcentimeter apparent calcification along the right subependymal margin appears slightly smaller which may be related to slice positioning. Moderate to severe patchy white matter hypodensities, similar.  No abnormal extra-axial fluid collections. Basal cisterns are patent. Moderate to severe calcific atherosclerosis of the carotid siphons.  CT CERVICAL SPINE  FINDINGS  Cervical vertebral bodies are intact, with grade 1 C4-5 anterolisthesis on degenerative basis. Moderate to severe C5-6 and C6-7 degenerative disc disease, moderate at C3-4. C1-2 articulation maintained with severe arthropathy. Mild calcified pannus about the odontoid process likely reflects CPPD. Life-support lines with fluid layering in the nasopharynx. Surgical clips in the right neck suggests carotid endarterectomy, with soft tissue within the right carotid space. Calcific atherosclerosis of the origin of left internal carotid artery with central hypodensity, equivocal for occlusion.  Degenerative disc disease and facet arthropathy with small to moderate C 5 6 and C6-7 apparent disc protrusions. Mild canal stenosis C5-6. Severe right C4-5 neural foraminal narrowing.  IMPRESSION: CT head: Acute moderate right middle cerebral artery territory infarct, involving the right temporal lobe. No hemorrhagic conversion.  Remote right basal ganglia/corona radiata and remote right temporoccipital infarcts, the latter of which appears slightly larger though this could be technical. Moderate to severe white matter changes suggest chronic small vessel ischemic disease.  CT cervical spine: No acute cervical spine fracture. Grade 1 C4-5 anterolisthesis on degenerative basis.  Postoperative changes of the right carotid space suggests recent carotid endarterectomy. In addition, calcific atherosclerosis of the origin left internal carotid artery, with central hypodensity which may be seen with occlusion.  Critical Value/emergent results were called by telephone at the time of interpretation on 01/22/14 at 3:10 PM to Dr. Aundra Millet, [facility] , who verbally acknowledged these results.   Electronically Signed   By: Awilda Metro   On: Jan 22, 2014 15:17   Ct Cervical Spine Wo Contrast  Jan 22, 2014   CLINICAL DATA:  Left-sided seizures, found down.  Left hemi paresis.  EXAM: CT HEAD WITHOUT CONTRAST  CT CERVICAL SPINE WITHOUT  CONTRAST  TECHNIQUE: Multidetector CT imaging of the head and cervical spine was performed following the standard protocol without intravenous contrast. Multiplanar CT image reconstructions of the cervical spine were also generated.  COMPARISON:  CT HEAD W/O CM dated 12/30/2013  FINDINGS: CT HEAD FINDINGS  Right temporal loss of the great matter junction, axial  12/32, with equivocal right hyperdense MCA.  No acute intraparenchymal hemorrhage, mass effect or midline shift. Wedge-like hypodensity in right temporal occipital lobe appears somewhat worse though, this could be due to slice selection. Remote right basal ganglia/ corona radiata infarct with ex vacuo dilatation right frontal horn/lateral ventricle again seen. Subcentimeter apparent calcification along the right subependymal margin appears slightly smaller which may be related to slice positioning. Moderate to severe patchy white matter hypodensities, similar.  No abnormal extra-axial fluid collections. Basal cisterns are patent. Moderate to severe calcific atherosclerosis of the carotid siphons.  CT CERVICAL SPINE FINDINGS  Cervical vertebral bodies are intact, with grade 1 C4-5 anterolisthesis on degenerative basis. Moderate to severe C5-6 and C6-7 degenerative disc disease, moderate at C3-4. C1-2 articulation maintained with severe arthropathy. Mild calcified pannus about the odontoid process likely reflects CPPD. Life-support lines with fluid layering in the nasopharynx. Surgical clips in the right neck suggests carotid endarterectomy, with soft tissue within the right carotid space. Calcific atherosclerosis of the origin of left internal carotid artery with central hypodensity, equivocal for occlusion.  Degenerative disc disease and facet arthropathy with small to moderate C 5 6 and C6-7 apparent disc protrusions. Mild canal stenosis C5-6. Severe right C4-5 neural foraminal narrowing.  IMPRESSION: CT head: Acute moderate right middle cerebral artery  territory infarct, involving the right temporal lobe. No hemorrhagic conversion.  Remote right basal ganglia/corona radiata and remote right temporoccipital infarcts, the latter of which appears slightly larger though this could be technical. Moderate to severe white matter changes suggest chronic small vessel ischemic disease.  CT cervical spine: No acute cervical spine fracture. Grade 1 C4-5 anterolisthesis on degenerative basis.  Postoperative changes of the right carotid space suggests recent carotid endarterectomy. In addition, calcific atherosclerosis of the origin left internal carotid artery, with central hypodensity which may be seen with occlusion.  Critical Value/emergent results were called by telephone at the time of interpretation on 01/27/2014 at 3:10 PM to Dr. Aundra Millet, [facility] , who verbally acknowledged these results.   Electronically Signed   By: Awilda Metro   On: 01/30/2014 15:17   Dg Chest Portable 1 View  01/22/2014   CLINICAL DATA:  Seizure.  Endotracheal tube placement.  EXAM: PORTABLE CHEST - 1 VIEW  COMPARISON:  01/02/2014  FINDINGS: Endotracheal tube has its tip 4 cm above the carina. Nasogastric tube has its tip can a hiatal hernia above the level of the diaphragm. There is interstitial and early alveolar pulmonary edema. No effusion.  IMPRESSION: Acute pulmonary edema.  Endotracheal tube well positioned.  Nasogastric tube tip in a hiatal hernia, above the level of the diaphragm.   Electronically Signed   By: Paulina Fusi M.D.   On: 01/31/2014 14:17    ASSESSMENT / PLAN:  NEUROLOGIC A:   R MCA CVA, likely embolic stent related? Seizures Acute Encephalopathy  P:   -Keppra IV, further dosing per pharmacy  -Neurology Consult -f/u CT head in am  -ASA -role addition plavix? Per neuro -carotid dopplers (per neuro) -propofol for sedation, PRN fentanyl for pain  -serial neuro checks -eeg  PULMONARY A: Acute Respiratory Failure - in setting of CVA R/O Aspiration  P:    -full vent support -f/u ABG, keep same MV -trend CXR -PRN albuterol -to goal 40% peep 5  CARDIOVASCULAR A:  Mild Elevation Troponin CAD HTN Emergency Carotid Occlusion (L) Carotid Stent (R, on 3/31) P:  -Dr. Durwin Nora office notified of admission -assess ECHO (will defer to Neuro if want to keep  given recent assessment) -ASA -DNR -repeat EKG in am  -trend troponin  -permissive HTN for now, last sys 226, will re assess after propofol, treat sys greater 220 or diast greater 105  RENAL A:   Acute Kidney Injury - mild elevation in sr cr P:   -gentle hydration -no free water -allow na to rise  GASTROINTESTINAL A:   NPO P:   -OGT -consider nutrition in am 4/11  HEMATOLOGIC A:   No acute process  P:  -monitor CBC -heparin for DVT prophylaxis   INFECTIOUS A:   Concern for Aspiration Injury - mild bilateral interstitial changes  P:   -monitor fever curve / leukocytosis -hold abx for now -trend cxr  ENDOCRINE A:   No acute issues   P:   -monitor BMP glucose trends  GLOBAL: -extensive discussion with family at bedside regarding current clinical status.  Family wishes for DNR status in the event of arrest.  Will continue current therapies.  Will re eval in 72 hrs, if not progressing , she would have wanted comfort care.  Canary Brim, NP-C [place] [facility] Pgr: [phone] or [phone]   I have personally obtained a history, examined the patient, evaluated laboratory and imaging results, formulated the assessment and plan and placed orders.  CRITICAL CARE: The patient is critically ill with multiple organ systems failure and requires high complexity decision making for assessment and support, frequent evaluation and titration of therapies, application of advanced monitoring technologies and extensive interpretation of multiple databases. Critical Care Time devoted to patient care services described in this note is 40 minutes.    02/04/2014, 3:20  PM Mcarthur Rossetti. Tyson Alias, MD, FACP Pgr: [phone]  Pulmonary & Critical Care

## 2014-01-16 NOTE — ED Notes (Signed)
EEG testing in progress

## 2014-01-16 NOTE — ED Provider Notes (Signed)
CSN: [id number]     Arrival date & time 01-31-2014  1339 History   First MD Initiated Contact with Patient 01/31/2014 1401     Chief Complaint  Patient presents with  . Seizures     (Consider location/radiation/quality/duration/timing/severity/associated sxs/prior Treatment) HPI Comments: Pt is a 79 y.o. female with Pmhx as above who presents with unresponsiveness.  Per EMS/son, pt found on the floor around 12:45 today, was speaking, but had shaking of the L side of her body. EMS arrived, wittnessed intermittent L sided seizure activity. 2.5mg  IV versed given and reported pt had become unresponsive en route.  She had recent admission after she was found to have total L carotid occulusion and near total R carotid occlusion. R carotid stent placed and pt d/c home about 10 days ago and had been doing fine. Ann Maki talked to her on the phone yesterday and she sounded nml. He went to pick her up for f/u appointment today and found her as above.   Patient is a 79 y.o. female presenting with neurologic complaint. The history is provided by the EMS personnel and a relative. The history is limited by the condition of the patient.  Neurologic Problem This is a new problem. Episode onset: unknown time. Episode frequency: unknown. The problem has been rapidly worsening. Associated symptoms comments: Unknown . Exacerbated by: unknown. Relieved by: unknown. Treatments tried: versed. The treatment provided no relief.    Past Medical History  Diagnosis Date  . Hyperlipidemia 12/07/2004  . Hypertension 03/09/2004  . Carotid artery occlusion   . Pneumonia     hx  . Arthritis   . Heart murmur     mild AS/MR, mod TR 12/2013 echo   Past Surgical History  Procedure Laterality Date  . Orif ankle fracture  1995    Right ankle fracture  . Normal vaginal delivery  1962  . Eye surgery      cataract removed OD 01/05/2005//cataract Removed OS 03/14/2005  . Endarterectomy Right 01/06/2014    Procedure: RIGHT CAROTID  ENDARTERECTOMY;  Surgeon: Chuck Hint, MD;  Location: [facility] OR;  Service: Vascular;  Laterality: Right;   Family History  Problem Relation Age of Onset  . Heart disease Mother 56    CABG, POSTOP  . Heart disease Father 62    MI : Depression  . Heart attack Father   . Hypertension Sister     hyperthyroid- Removed // Decreased hearing  . Cancer Paternal Uncle     unknown type  . Stroke Maternal Grandmother   . Diabetes Neg Hx   . Alcohol abuse Neg Hx   . Colon cancer Neg Hx   . Breast cancer Neg Hx    History  Substance Use Topics  . Smoking status: Former Smoker -- 0.25 packs/day for 2 years    Types: Cigarettes    Quit date: 01/03/1964  . Smokeless tobacco: Former Neurosurgeon    Quit date: 01/01/1964     Comment: quit over 50 years  . Alcohol Use: No   OB History   Grav Para Term Preterm Abortions TAB SAB Ect Mult Living                 Review of Systems  Unable to perform ROS: Patient unresponsive      Allergies  Amlodipine and Simvastatin  Home Medications   No current outpatient prescriptions on file. BP 173/73  Pulse 89  Temp(Src) 98.2 F (36.8 C) (Axillary)  Resp 20  Ht 5\' 4"  (1.626  m)  Wt 132 lb 11.5 oz (60.2 kg)  BMI 22.77 kg/m2  SpO2 100% Physical Exam  Constitutional: She appears well-developed and well-nourished. She appears distressed.  HENT:  Head: Normocephalic and atraumatic.  Mouth/Throat: No oropharyngeal exudate.  Eyes:  Pupils 2mm sluggish BL   Neck:  In c-collar   Cardiovascular: Normal rate, regular rhythm and normal heart sounds.  Exam reveals no gallop and no friction rub.   No murmur heard. Pulmonary/Chest: No respiratory distress. She has no wheezes. She has no rales.  Breathing shallow, occasionally sonorous  Abdominal: Soft. Bowel sounds are normal. She exhibits no distension and no mass. There is no tenderness. There is no rebound and no guarding.  Musculoskeletal: She exhibits no edema.  Abrasion to R elbow, BL knees  and R chest wall  Neurological: She is unresponsive. She exhibits abnormal muscle tone. Coordination abnormal.  GCS: No eye response, no verbal response, she spontaneously moves R arm, but does not follow commands, does not localize or withdraw from pain. No flexion or extension seen. No spontaneous mvmts of other extremities.   Skin: Skin is warm and dry.  Psychiatric: She has a normal mood and affect.    ED Course  INTUBATION Date/Time: 01/17/2014 12:25 PM Performed by: Toy CookeyHERTY, MEGAN, E Authorized by: Toy CookeyHERTY, MEGAN, E Consent: Verbal consent obtained. written consent not obtained. The procedure was performed in an emergent situation. Risks and benefits: risks, benefits and alternatives were discussed Consent given by: son. Patient understanding: patient states understanding of the procedure being performed Patient consent: the patient's understanding of the procedure matches consent given Procedure consent: procedure consent matches procedure scheduled Relevant documents: relevant documents present and verified Test results: test results available and properly labeled Site marked: the operative site was marked Imaging studies: imaging studies available Required items: required blood products, implants, devices, and special equipment available Patient identity confirmed: arm band Time out: Immediately prior to procedure a "time out" was called to verify the correct patient, procedure, equipment, support staff and site/side marked as required. Indications: airway protection Intubation method: video-assisted Patient status: paralyzed (RSI) Sedatives: etomidate Paralytic: rocuronium Laryngoscope size: Miller 3 Tube size: 7.0 mm Tube type: cuffed Number of attempts: 1 Cricoid pressure: no Cords visualized: yes Post-procedure assessment: chest rise and CO2 detector Breath sounds: equal Cuff inflated: yes ETT to lip: 22 cm Tube secured with: ETT holder Chest x-ray interpreted  by me and radiologist. Chest x-ray findings: endotracheal tube in appropriate position Patient tolerance: Patient tolerated the procedure well with no immediate complications.   (including critical care time) Labs Review Labs Reviewed  APTT - Abnormal; Notable for the following:    aPTT 22 (*)    All other components within normal limits  CBC - Abnormal; Notable for the following:    WBC 18.9 (*)    All other components within normal limits  DIFFERENTIAL - Abnormal; Notable for the following:    Neutrophils Relative % 89 (*)    Neutro Abs 16.7 (*)    Lymphocytes Relative 5 (*)    Monocytes Absolute 1.2 (*)    All other components within normal limits  COMPREHENSIVE METABOLIC PANEL - Abnormal; Notable for the following:    Glucose, Bld 164 (*)    AST 170 (*)    ALT 172 (*)    GFR calc non Af Amer 57 (*)    GFR calc Af Amer 66 (*)    All other components within normal limits  URINE RAPID DRUG SCREEN (HOSP PERFORMED) -  Abnormal; Notable for the following:    Benzodiazepines POSITIVE (*)    All other components within normal limits  URINALYSIS, ROUTINE W REFLEX MICROSCOPIC - Abnormal; Notable for the following:    Protein, ur 30 (*)    All other components within normal limits  URINE MICROSCOPIC-ADD ON - Abnormal; Notable for the following:    Casts HYALINE CASTS (*)    All other components within normal limits  CK - Abnormal; Notable for the following:    Total CK 242 (*)    All other components within normal limits  TROPONIN I - Abnormal; Notable for the following:    Troponin I 1.02 (*)    All other components within normal limits  TROPONIN I - Abnormal; Notable for the following:    Troponin I 0.64 (*)    All other components within normal limits  CBC - Abnormal; Notable for the following:    WBC 12.8 (*)    All other components within normal limits  BASIC METABOLIC PANEL - Abnormal; Notable for the following:    Glucose, Bld 124 (*)    GFR calc non Af Amer 86 (*)     All other components within normal limits  I-STAT CHEM 8, ED - Abnormal; Notable for the following:    Creatinine, Ser 1.20 (*)    Glucose, Bld 163 (*)    Calcium, Ion 1.39 (*)    All other components within normal limits  I-STAT TROPOININ, ED - Abnormal; Notable for the following:    Troponin i, poc 0.37 (*)    All other components within normal limits  CBG MONITORING, ED - Abnormal; Notable for the following:    Glucose-Capillary 175 (*)    All other components within normal limits  I-STAT ARTERIAL BLOOD GAS, ED - Abnormal; Notable for the following:    pCO2 arterial 45.8 (*)    pO2, Arterial 220.0 (*)    Bicarbonate 26.5 (*)    All other components within normal limits  MRSA PCR SCREENING  ETHANOL  PROTIME-INR  TRIGLYCERIDES  I-STAT TROPOININ, ED   Imaging Review Ct Head Wo Contrast  01/17/2014   CLINICAL DATA:  Left-sided seizures, found down.  Left hemi paresis.  EXAM: CT HEAD WITHOUT CONTRAST  CT CERVICAL SPINE WITHOUT CONTRAST  TECHNIQUE: Multidetector CT imaging of the head and cervical spine was performed following the standard protocol without intravenous contrast. Multiplanar CT image reconstructions of the cervical spine were also generated.  COMPARISON:  CT HEAD W/O CM dated 12/30/2013  FINDINGS: CT HEAD FINDINGS  Right temporal loss of the great matter junction, axial 12/32, with equivocal right hyperdense MCA.  No acute intraparenchymal hemorrhage, mass effect or midline shift. Wedge-like hypodensity in right temporal occipital lobe appears somewhat worse though, this could be due to slice selection. Remote right basal ganglia/ corona radiata infarct with ex vacuo dilatation right frontal horn/lateral ventricle again seen. Subcentimeter apparent calcification along the right subependymal margin appears slightly smaller which may be related to slice positioning. Moderate to severe patchy white matter hypodensities, similar.  No abnormal extra-axial fluid collections. Basal  cisterns are patent. Moderate to severe calcific atherosclerosis of the carotid siphons.  CT CERVICAL SPINE FINDINGS  Cervical vertebral bodies are intact, with grade 1 C4-5 anterolisthesis on degenerative basis. Moderate to severe C5-6 and C6-7 degenerative disc disease, moderate at C3-4. C1-2 articulation maintained with severe arthropathy. Mild calcified pannus about the odontoid process likely reflects CPPD. Life-support lines with fluid layering in the nasopharynx. Surgical clips  in the right neck suggests carotid endarterectomy, with soft tissue within the right carotid space. Calcific atherosclerosis of the origin of left internal carotid artery with central hypodensity, equivocal for occlusion.  Degenerative disc disease and facet arthropathy with small to moderate C 5 6 and C6-7 apparent disc protrusions. Mild canal stenosis C5-6. Severe right C4-5 neural foraminal narrowing.  IMPRESSION: CT head: Acute moderate right middle cerebral artery territory infarct, involving the right temporal lobe. No hemorrhagic conversion.  Remote right basal ganglia/corona radiata and remote right temporoccipital infarcts, the latter of which appears slightly larger though this could be technical. Moderate to severe white matter changes suggest chronic small vessel ischemic disease.  CT cervical spine: No acute cervical spine fracture. Grade 1 C4-5 anterolisthesis on degenerative basis.  Postoperative changes of the right carotid space suggests recent carotid endarterectomy. In addition, calcific atherosclerosis of the origin left internal carotid artery, with central hypodensity which may be seen with occlusion.  Critical Value/emergent results were called by telephone at the time of interpretation on 02-14-14 at 3:10 PM to Dr. Aundra Millet, [facility] , who verbally acknowledged these results.   Electronically Signed   By: Awilda Metro   On: 2014-02-14 15:17   Ct Cervical Spine Wo Contrast  02/14/14   CLINICAL DATA:   Left-sided seizures, found down.  Left hemi paresis.  EXAM: CT HEAD WITHOUT CONTRAST  CT CERVICAL SPINE WITHOUT CONTRAST  TECHNIQUE: Multidetector CT imaging of the head and cervical spine was performed following the standard protocol without intravenous contrast. Multiplanar CT image reconstructions of the cervical spine were also generated.  COMPARISON:  CT HEAD W/O CM dated 12/30/2013  FINDINGS: CT HEAD FINDINGS  Right temporal loss of the great matter junction, axial 12/32, with equivocal right hyperdense MCA.  No acute intraparenchymal hemorrhage, mass effect or midline shift. Wedge-like hypodensity in right temporal occipital lobe appears somewhat worse though, this could be due to slice selection. Remote right basal ganglia/ corona radiata infarct with ex vacuo dilatation right frontal horn/lateral ventricle again seen. Subcentimeter apparent calcification along the right subependymal margin appears slightly smaller which may be related to slice positioning. Moderate to severe patchy white matter hypodensities, similar.  No abnormal extra-axial fluid collections. Basal cisterns are patent. Moderate to severe calcific atherosclerosis of the carotid siphons.  CT CERVICAL SPINE FINDINGS  Cervical vertebral bodies are intact, with grade 1 C4-5 anterolisthesis on degenerative basis. Moderate to severe C5-6 and C6-7 degenerative disc disease, moderate at C3-4. C1-2 articulation maintained with severe arthropathy. Mild calcified pannus about the odontoid process likely reflects CPPD. Life-support lines with fluid layering in the nasopharynx. Surgical clips in the right neck suggests carotid endarterectomy, with soft tissue within the right carotid space. Calcific atherosclerosis of the origin of left internal carotid artery with central hypodensity, equivocal for occlusion.  Degenerative disc disease and facet arthropathy with small to moderate C 5 6 and C6-7 apparent disc protrusions. Mild canal stenosis C5-6.  Severe right C4-5 neural foraminal narrowing.  IMPRESSION: CT head: Acute moderate right middle cerebral artery territory infarct, involving the right temporal lobe. No hemorrhagic conversion.  Remote right basal ganglia/corona radiata and remote right temporoccipital infarcts, the latter of which appears slightly larger though this could be technical. Moderate to severe white matter changes suggest chronic small vessel ischemic disease.  CT cervical spine: No acute cervical spine fracture. Grade 1 C4-5 anterolisthesis on degenerative basis.  Postoperative changes of the right carotid space suggests recent carotid endarterectomy. In addition, calcific atherosclerosis of the origin  left internal carotid artery, with central hypodensity which may be seen with occlusion.  Critical Value/emergent results were called by telephone at the time of interpretation on 02-09-2014 at 3:10 PM to Dr. Aundra Millet, [facility] , who verbally acknowledged these results.   Electronically Signed   By: Awilda Metro   On: Feb 09, 2014 15:17   Mr Maxine Glenn Head Wo Contrast  01/17/2014   ADDENDUM REPORT: 01/17/2014 11:32  ADDENDUM: Images reviewed with Dr. Anne Hahn. The patient has had episode of hypertension and seizures. The areas of altered signal intensity and enhancement involving the right frontal -temporal -occipital lobe and the posterior right temporal lobe as well as the left frontal and parietal lobe may therefore represent result of posterior reversible encephalopathy syndrome/hypertensive encephalopathy (hyper perfusion post carotid endarterectomy). This may have a reversible component. Presently only small areas hemorrhage is noted in the right occipital aspect.  The altered signal intensity within the right mid to anterior temporal lobe, operculum region and sub insular region may represent result of seizure activity (possibility of herpes discussed with Dr. Anne Hahn).  The restricted motion within the posterior right thalamus may  represent result of acute infarction.  These findings would be best assessed by serial follow-up MR imaging.   Electronically Signed   By: Bridgett Larsson M.D.   On: 01/17/2014 11:32   01/17/2014   CLINICAL DATA:  Post right carotid endarterectomy 01/06/2014. Found down. Hypertension. Hyperlipidemia.  EXAM: MRI HEAD WITHOUT AND WITH CONTRAST AND MRA HEAD WITHOUT AND WITH CONTRAST AND MRI NECK WITHOUT AND WITH CONTRAST  TECHNIQUE: Multiplanar, multiecho pulse sequences of the brain and surrounding structures were obtained without and with intravenous contrast. Angiographic images of the head were obtained using MRA technique without and with contrast. Multiplanar, multiecho pulse sequences of the neck and surrounding structures were obtained without and with intravenous contrast.  CONTRAST:  10 cc MultiHance.  COMPARISON:  2014/02/09 and 12/30/2013 CT.  No comparison MR.  FINDINGS: MRI HEAD FINDINGS  Occluded left internal carotid artery.  Recent right carotid endarterectomy.  Large area of altered signal intensity involving the right frontal -parietal -occipital lobe and posterior right temporal lobe as well as left frontal and parietal lobe which demonstrates patchy enhancement. Small area of blood breakdown products right occipital lobe. Appearance is suggestive of the presence of subacute infarcts.  Altered signal intensity mid to anterior right temporal lobe, operculum region and subinsular region as well as involving the posterior right thalamus suggestive of acute infarct (infection such as herpes felt to be less likely consideration given the clinical setting).  Remote right coronal radiata/centrum semiovale and caudate head infarct with dilated right lateral ventricle and wallerian degeneration.  Atrophy without hydrocephalus.  No intracranial mass lesion seen separate from the above described findings.  Pooled secretions posterior superior nasopharynx.  Mild paranasal sinus mucosal thickening.  Cervical  medullary junction, pituitary region, pineal region and orbital structures unremarkable.  MRA HEAD FINDINGS  Occluded left internal carotid artery. Reconstitution of flow at the left carotid terminus is not adequate as there is poor flow seen within the left middle cerebral artery branches and at the level of the left carotid terminus.  Ectatic right internal carotid artery cavernous and supra clinoid segment. No saccular aneurysm separate from the atherosclerotic type changes.  Middle cerebral artery branch vessel mild irregularity.  Moderate narrowing left vertebral artery. Mild irregularity right vertebral artery.  Non visualization right posterior inferior cerebellar artery and left anterior inferior cerebellar artery.  Mild narrowing and irregularity of the  basilar artery which is ectatic.  Fetal type contribution to the right posterior cerebral artery. Mild to moderate narrowing and right posterior cerebral artery proximal to terminal branching.  MRI NECK FINDINGS  Three vessel aortic arch.  Left internal carotid artery is occluded.  Post right carotid endarterectomy remains patent. No high-grade stenosis although there is mild narrowing at the distal anastomotic site.  Right vertebral artery is dominant. Moderate narrowing of the origin of the vertebral arteries bilaterally greater on the right.  No high-grade stenosis of either subclavian artery.  IMPRESSION: MRI HEAD :  Occluded left internal carotid artery.  Recent right carotid endarterectomy.  Subacute enhancing large infarcts involving right frontal -parietal -occipital lobe and posterior right temporal lobe as well as left frontal and parietal lobe. Small area of blood breakdown products right occipital lobe.  Acute infarcts involving mid to anterior right temporal lobe, operculum region and subinsular region as well as involving the posterior right thalamus suggestive of acute infarct (infection such as herpes felt to be less likely consideration  given the clinical setting).  Remote right coronal radiata/centrum semiovale and caudate head infarct with dilated right lateral ventricle and wallerian degeneration.  MRA HEAD:  Occluded left internal carotid artery. Reconstitution of flow at the left carotid terminus is not adequate as there is poor flow seen within the left middle cerebral artery branches and at the level of the left carotid terminus.  Ectatic right internal carotid artery cavernous and supra clinoid segment.  Middle cerebral artery branch vessel mild irregularity.  Moderate narrowing left vertebral artery. Mild irregularity right vertebral artery.  Non visualization right posterior inferior cerebellar artery and left anterior inferior cerebellar artery.  Mild narrowing and irregularity of the basilar artery which is ectatic.  Mild to moderate narrowing and right posterior cerebral artery proximal to terminal branching.  MRI NECK:  Left internal carotid artery is occluded.  Post right carotid endarterectomy. No high-grade stenosis although there is mild narrowing at the distal anastomotic site.  Right vertebral artery is dominant. Moderate narrowing of the origin of the vertebral arteries bilaterally greater on the right.  Please see above for further detail.  These results were called by telephone at the time of interpretation on 02/13/14 at 7:52 PM to [facility] patients nurse who verbally acknowledged these results.  Electronically Signed: By: Bridgett Larsson M.D. On: 2014-02-13 19:56   Mr Angiogram Neck W Wo Contrast  01/17/2014   ADDENDUM REPORT: 01/17/2014 11:32  ADDENDUM: Images reviewed with Dr. Anne Hahn. The patient has had episode of hypertension and seizures. The areas of altered signal intensity and enhancement involving the right frontal -temporal -occipital lobe and the posterior right temporal lobe as well as the left frontal and parietal lobe may therefore represent result of posterior reversible encephalopathy syndrome/hypertensive  encephalopathy (hyper perfusion post carotid endarterectomy). This may have a reversible component. Presently only small areas hemorrhage is noted in the right occipital aspect.  The altered signal intensity within the right mid to anterior temporal lobe, operculum region and sub insular region may represent result of seizure activity (possibility of herpes discussed with Dr. Anne Hahn).  The restricted motion within the posterior right thalamus may represent result of acute infarction.  These findings would be best assessed by serial follow-up MR imaging.   Electronically Signed   By: Bridgett Larsson M.D.   On: 01/17/2014 11:32   01/17/2014   CLINICAL DATA:  Post right carotid endarterectomy 01/06/2014. Found down. Hypertension. Hyperlipidemia.  EXAM: MRI HEAD WITHOUT AND WITH  CONTRAST AND MRA HEAD WITHOUT AND WITH CONTRAST AND MRI NECK WITHOUT AND WITH CONTRAST  TECHNIQUE: Multiplanar, multiecho pulse sequences of the brain and surrounding structures were obtained without and with intravenous contrast. Angiographic images of the head were obtained using MRA technique without and with contrast. Multiplanar, multiecho pulse sequences of the neck and surrounding structures were obtained without and with intravenous contrast.  CONTRAST:  10 cc MultiHance.  COMPARISON:  01/08/2014 and 12/30/2013 CT.  No comparison MR.  FINDINGS: MRI HEAD FINDINGS  Occluded left internal carotid artery.  Recent right carotid endarterectomy.  Large area of altered signal intensity involving the right frontal -parietal -occipital lobe and posterior right temporal lobe as well as left frontal and parietal lobe which demonstrates patchy enhancement. Small area of blood breakdown products right occipital lobe. Appearance is suggestive of the presence of subacute infarcts.  Altered signal intensity mid to anterior right temporal lobe, operculum region and subinsular region as well as involving the posterior right thalamus suggestive of acute  infarct (infection such as herpes felt to be less likely consideration given the clinical setting).  Remote right coronal radiata/centrum semiovale and caudate head infarct with dilated right lateral ventricle and wallerian degeneration.  Atrophy without hydrocephalus.  No intracranial mass lesion seen separate from the above described findings.  Pooled secretions posterior superior nasopharynx.  Mild paranasal sinus mucosal thickening.  Cervical medullary junction, pituitary region, pineal region and orbital structures unremarkable.  MRA HEAD FINDINGS  Occluded left internal carotid artery. Reconstitution of flow at the left carotid terminus is not adequate as there is poor flow seen within the left middle cerebral artery branches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

## 2014-01-16 NOTE — ED Notes (Signed)
MD at bedside.CCU doc

## 2014-01-16 NOTE — Consult Note (Addendum)
Referring Physician: Celestia KhatFienstien    Chief Complaint: new right MCA CVA and seizure  HPI:                                                                                                                                         Jacqueline StablerBarbara F Cline is an 79 y.o. female with known left complete carotid occlusion who recently underwent a right carotid stent on 01/06/14.  Per son she ws doing very well, BP controled and lived alone.  Her son had called patient at 8 PM last night and she held a normal conversation.  He came to pick her up for a Dr. Appointment today at 12:30 and found her face down on the floor with left arm and leg jerking.  He states she was awake and able to speak to him.  She made mention she had fallen at 8 am this morning. On arrival patient had witnessed left sided seizure, BP 200/100 and was given 2.5 mg Versed. After versed administered she was noted to have airway compromise and intubated. Per note patient then demonstrated right sided seizure activity and was given 2 mg Ativan.  Currently she is intubated and sedated on Propofol.  She follows no commands and shows no sign of clinical seizure.   Date last known well: Date: 01/15/2014 Time last known well: Time: 20:00 tPA Given: No: out of window  Past Medical History  Diagnosis Date  . Hyperlipidemia 12/07/2004  . Hypertension 03/09/2004  . Carotid artery occlusion   . Pneumonia     hx  . Arthritis   . Heart murmur     mild AS/MR, mod TR 12/2013 echo    Past Surgical History  Procedure Laterality Date  . Orif ankle fracture  1995    Right ankle fracture  . Normal vaginal delivery  1962  . Eye surgery      cataract removed OD 01/05/2005//cataract Removed OS 03/14/2005  . Endarterectomy Right 01/06/2014    Procedure: RIGHT CAROTID ENDARTERECTOMY;  Surgeon: Chuck Hinthristopher S Dickson, MD;  [facility];  Service: Vascular;  Laterality: Right;    Family History  Problem Relation Age of Onset  . Heart disease Mother [id number]     CABG, POSTOP  . Heart disease Father [id number]    MI : Depression  . Heart attack Father   . Hypertension Sister     hyperthyroid- Removed // Decreased hearing  . Cancer Paternal Uncle     unknown type  . Stroke Maternal Grandmother   . Diabetes Neg Hx   . Alcohol abuse Neg Hx   . Colon cancer Neg Hx   . Breast cancer Neg Hx    Social History:  reports that she quit smoking about 50 years ago. Her smoking use included Cigarettes. She has a .5 pack-year smoking history. She quit smokeless tobacco use about 50 years ago. She reports that she does  not drink alcohol or use illicit drugs.  Allergies:  Allergies  Allergen Reactions  . Amlodipine     Light headed, trouble walking  . Simvastatin     myalgias    Medications:                                                                                                                           Current Facility-Administered Medications  Medication Dose Route Frequency Provider Last Rate Last Dose  . etomidate (AMIDATE) 2 MG/ML injection           . etomidate (AMIDATE) injection   Intravenous PRN Shanna Cisco, MD   20 mg at 01/31/2014 1354  . fentaNYL (SUBLIMAZE) injection 50 mcg  50 mcg Intravenous Q2H PRN Jeanella Craze, NP      . levETIRAcetam (KEPPRA) 500 mg in sodium chloride 0.9 % 100 mL IVPB  500 mg Intravenous Q12H Nelda Bucks, MD      . lidocaine (cardiac) 100 mg/66ml (XYLOCAINE) 20 MG/ML injection 2%           . midazolam (VERSED) 1 mg/mL in sodium chloride 0.9 % 50 mL infusion  0.5-2 mg/hr Intravenous Continuous Shanna Cisco, MD   1 mg/hr at 02/01/2014 1509  . midazolam (VERSED) 2 MG/2ML injection           . propofol (DIPRIVAN) 10 mg/ml infusion  0-50 mcg/kg/min Intravenous Continuous Jeanella Craze, NP 6.6 mL/hr at 01/09/2014 1553 20 mcg/kg/min at 01/08/2014 1553  . rocuronium (ZEMURON) 50 MG/5ML injection           . rocuronium (ZEMURON) injection   Intravenous PRN Shanna Cisco, MD   70 mg at 01/24/2014 1356  .  succinylcholine (ANECTINE) 20 MG/ML injection            Current Outpatient Prescriptions  Medication Sig Dispense Refill  . acetaminophen (TYLENOL) 325 MG tablet Take 650 mg by mouth every 6 (six) hours as needed for moderate pain.      . Ascorbic Acid (VITAMIN C) 1000 MG tablet Take 1,000 mg by mouth daily.        Marland Kitchen aspirin 81 MG tablet Take 81 mg by mouth daily.      . beta carotene w/minerals (OCUVITE) tablet Take 1 tablet by mouth daily.      Marland Kitchen Cod Liver Oil CAPS Take 1 capsule by mouth 2 (two) times daily.       . Coenzyme Q10 (CO Q 10) 100 MG CAPS Take 1 capsule by mouth 2 (two) times daily.       Marland Kitchen lisinopril (PRINIVIL,ZESTRIL) 20 MG tablet Take 20 mg by mouth 2 (two) times daily.      . metoprolol (LOPRESSOR) 50 MG tablet Take 50 mg by mouth 2 (two) times daily.      . Misc Natural Products (CALCIUM PLUS ADVANCED PO) Take 1 tablet by mouth 2 (two) times daily.       Marland Kitchen oxyCODONE (  ROXICODONE) 5 MG immediate release tablet Take 1 tablet (5 mg total) by mouth every 6 (six) hours as needed for severe pain.  30 tablet  0  . Polyethyl Glycol-Propyl Glycol (SYSTANE) 0.4-0.3 % GEL Place 1 drop into both eyes at bedtime as needed (for dry eyes).       . pravastatin (PRAVACHOL) 40 MG tablet Take 20 mg by mouth daily.      . vitamin E 400 UNIT capsule Take 400 Units by mouth daily.         ROS:                                                                                                                                       History obtained from unobtainable from patient due to mental status    Neurologic Examination:                                                                                                      Blood pressure 211/99, pulse 89, temperature 97.4 F (36.3 C), temperature source Axillary, resp. rate 14, weight 54.7 kg (120 lb 9.5 oz), SpO2 100.00%.   Mental Status: Intubated, sedated on Propofol, no response to pain, breathing with the vent (just intubated) Cranial  Nerves: II: Discs flat bilaterally; no blink to threat, pupils equal, round, reactive to light and accommodation III,IV, VI: ptosis not present, Doll's intact V,VII: smile symmetric, facial light touch sensation normal bilaterally VIII: unable to assess IX,X: Gag preserved XI: unable to assess XII: unable to assess tongue  Motor: Flaccid throughout Sensory: No response to pain Deep Tendon Reflexes:  Right: Upper Extremity   Left: Upper extremity   biceps (C-5 to C-6) 2/4   biceps (C-5 to C-6) 2/4 tricep (C7) 2/4    triceps (C7) 2/4 Brachioradialis (C6) 2/4  Brachioradialis (C6) 2/4  Lower Extremity Lower Extremity  quadriceps (L-2 to L-4) 1/4   quadriceps (L-2 to L-4) 1/4 Achilles (S1) 0/4   Achilles (S1) 0/4  Plantars: Up going bilaterally Cerebellar: Unable to assess Gait: unable to assess CV: pulses palpable throughout    Lab Results: Basic Metabolic Panel:  Recent Labs Lab 01/11/2014 1402 01/10/2014 1423  NA 144 142  K 4.1 3.9  CL 103 103  CO2 26  --   GLUCOSE 164* 163*  BUN 20 19  CREATININE 0.95 1.20*  CALCIUM 10.1  --     Liver Function Tests:  Recent Labs Lab 01/31/2014 1402  AST 170*  ALT 172*  ALKPHOS 74  BILITOT 0.4  PROT 7.1  ALBUMIN 3.5   No results found for this basename: LIPASE, AMYLASE,  in the last 168 hours No results found for this basename: AMMONIA,  in the last 168 hours  CBC:  Recent Labs Lab 02/01/2014 1402 01/24/2014 1423  WBC 18.9*  --   NEUTROABS 16.7*  --   HGB 14.0 14.6  HCT 42.2 43.0  MCV 91.3  --   PLT 247  --     Cardiac Enzymes: No results found for this basename: CKTOTAL, CKMB, CKMBINDEX, TROPONINI,  in the last 168 hours  Lipid Panel: No results found for this basename: CHOL, TRIG, HDL, CHOLHDL, VLDL, LDLCALC,  in the last 168 hours  CBG:  Recent Labs Lab 01/17/2014 1351  GLUCAP 175*    Microbiology: Results for orders placed during the hospital encounter of 01/02/14  SURGICAL PCR SCREEN     Status:  None   Collection Time    01/02/14  9:17 AM      Result Value Ref Range Status   MRSA, PCR NEGATIVE  NEGATIVE Final   Staphylococcus aureus NEGATIVE  NEGATIVE Final   Comment:            The Xpert SA Assay (FDA     approved for NASAL specimens     in patients over 63 years of age),     is one component of     a comprehensive surveillance     program.  Test performance has     been validated by [employer] for patients greater     than or equal to 67 year old.     It is not intended     to diagnose infection nor to     guide or monitor treatment.    Coagulation Studies:  Recent Labs  01/15/2014 1402  LABPROT 12.2  INR 0.92    Imaging: Ct Head Wo Contrast  01/11/2014   CLINICAL DATA:  Left-sided seizures, found down.  Left hemi paresis.  EXAM: CT HEAD WITHOUT CONTRAST  CT CERVICAL SPINE WITHOUT CONTRAST  TECHNIQUE: Multidetector CT imaging of the head and cervical spine was performed following the standard protocol without intravenous contrast. Multiplanar CT image reconstructions of the cervical spine were also generated.  COMPARISON:  CT HEAD W/O CM dated 12/30/2013  FINDINGS: CT HEAD FINDINGS  Right temporal loss of the great matter junction, axial 12/32, with equivocal right hyperdense MCA.  No acute intraparenchymal hemorrhage, mass effect or midline shift. Wedge-like hypodensity in right temporal occipital lobe appears somewhat worse though, this could be due to slice selection. Remote right basal ganglia/ corona radiata infarct with ex vacuo dilatation right frontal horn/lateral ventricle again seen. Subcentimeter apparent calcification along the right subependymal margin appears slightly smaller which may be related to slice positioning. Moderate to severe patchy white matter hypodensities, similar.  No abnormal extra-axial fluid collections. Basal cisterns are patent. Moderate to severe calcific atherosclerosis of the carotid siphons.  CT CERVICAL SPINE FINDINGS  Cervical  vertebral bodies are intact, with grade 1 C4-5 anterolisthesis on degenerative basis. Moderate to severe C5-6 and C6-7 degenerative disc disease, moderate at C3-4. C1-2 articulation maintained with severe arthropathy. Mild calcified pannus about the odontoid process likely reflects CPPD. Life-support lines with fluid layering in the nasopharynx. Surgical clips in the right neck suggests carotid endarterectomy, with soft tissue within the right carotid space. Calcific atherosclerosis of the origin of left internal carotid artery  with central hypodensity, equivocal for occlusion.  Degenerative disc disease and facet arthropathy with small to moderate C 5 6 and C6-7 apparent disc protrusions. Mild canal stenosis C5-6. Severe right C4-5 neural foraminal narrowing.  IMPRESSION: CT head: Acute moderate right middle cerebral artery territory infarct, involving the right temporal lobe. No hemorrhagic conversion.  Remote right basal ganglia/corona radiata and remote right temporoccipital infarcts, the latter of which appears slightly larger though this could be technical. Moderate to severe white matter changes suggest chronic small vessel ischemic disease.  CT cervical spine: No acute cervical spine fracture. Grade 1 C4-5 anterolisthesis on degenerative basis.  Postoperative changes of the right carotid space suggests recent carotid endarterectomy. In addition, calcific atherosclerosis of the origin left internal carotid artery, with central hypodensity which may be seen with occlusion.  Critical Value/emergent results were called by telephone at the time of interpretation on 01/10/2014 at 3:10 PM to Dr. Aundra Millet, [facility] , who verbally acknowledged these results.   Electronically Signed   By: Awilda Metro   On: 01/11/2014 15:17   Ct Cervical Spine Wo Contrast  02/05/2014   CLINICAL DATA:  Left-sided seizures, found down.  Left hemi paresis.  EXAM: CT HEAD WITHOUT CONTRAST  CT CERVICAL SPINE WITHOUT CONTRAST   TECHNIQUE: Multidetector CT imaging of the head and cervical spine was performed following the standard protocol without intravenous contrast. Multiplanar CT image reconstructions of the cervical spine were also generated.  COMPARISON:  CT HEAD W/O CM dated 12/30/2013  FINDINGS: CT HEAD FINDINGS  Right temporal loss of the great matter junction, axial 12/32, with equivocal right hyperdense MCA.  No acute intraparenchymal hemorrhage, mass effect or midline shift. Wedge-like hypodensity in right temporal occipital lobe appears somewhat worse though, this could be due to slice selection. Remote right basal ganglia/ corona radiata infarct with ex vacuo dilatation right frontal horn/lateral ventricle again seen. Subcentimeter apparent calcification along the right subependymal margin appears slightly smaller which may be related to slice positioning. Moderate to severe patchy white matter hypodensities, similar.  No abnormal extra-axial fluid collections. Basal cisterns are patent. Moderate to severe calcific atherosclerosis of the carotid siphons.  CT CERVICAL SPINE FINDINGS  Cervical vertebral bodies are intact, with grade 1 C4-5 anterolisthesis on degenerative basis. Moderate to severe C5-6 and C6-7 degenerative disc disease, moderate at C3-4. C1-2 articulation maintained with severe arthropathy. Mild calcified pannus about the odontoid process likely reflects CPPD. Life-support lines with fluid layering in the nasopharynx. Surgical clips in the right neck suggests carotid endarterectomy, with soft tissue within the right carotid space. Calcific atherosclerosis of the origin of left internal carotid artery with central hypodensity, equivocal for occlusion.  Degenerative disc disease and facet arthropathy with small to moderate C 5 6 and C6-7 apparent disc protrusions. Mild canal stenosis C5-6. Severe right C4-5 neural foraminal narrowing.  IMPRESSION: CT head: Acute moderate right middle cerebral artery territory  infarct, involving the right temporal lobe. No hemorrhagic conversion.  Remote right basal ganglia/corona radiata and remote right temporoccipital infarcts, the latter of which appears slightly larger though this could be technical. Moderate to severe white matter changes suggest chronic small vessel ischemic disease.  CT cervical spine: No acute cervical spine fracture. Grade 1 C4-5 anterolisthesis on degenerative basis.  Postoperative changes of the right carotid space suggests recent carotid endarterectomy. In addition, calcific atherosclerosis of the origin left internal carotid artery, with central hypodensity which may be seen with occlusion.  Critical Value/emergent results were called by telephone at the time of  interpretation on 01/17/2014 at 3:10 PM to Dr. Aundra Millet, [facility] , who verbally acknowledged these results.   Electronically Signed   By: Awilda Metro   On: 01/27/2014 15:17   Dg Chest Portable 1 View  01/14/2014   CLINICAL DATA:  Seizure.  Endotracheal tube placement.  EXAM: PORTABLE CHEST - 1 VIEW  COMPARISON:  01/02/2014  FINDINGS: Endotracheal tube has its tip 4 cm above the carina. Nasogastric tube has its tip can a hiatal hernia above the level of the diaphragm. There is interstitial and early alveolar pulmonary edema. No effusion.  IMPRESSION: Acute pulmonary edema.  Endotracheal tube well positioned.  Nasogastric tube tip in a hiatal hernia, above the level of the diaphragm.   Electronically Signed   By: Paulina Fusi M.D.   On: 01/29/2014 14:17    Felicie Morn PA-C [facility] [phone]  01/18/2014, 3:46 PM   Patient seen and examined.  Clinical course and management discussed.  Necessary edits performed.  I agree with the above.  Assessment and plan of care developed and discussed below.     Assessment: 79 y.o. female presenting after being found down with seizure activity.  Patient had further seizures en route.  Now s/p Versed and intubated on Diprovan.  No  further clinical seizure activity noted.  Head CT reviewed and shows a right temporal area of hypodensity.  Areas of chronic ischemic disease noted in the right hemisphere as well.  Patient s/p recent right carotid stent placement.  On ASA at home.  Patient outside of time window for tPA and intervention.  Stroke Risk Factors - hyperlipidemia, hypertension and carotid stenosis  Recommendations: 1.  MRI/A of the brain without contrast 2.  ASA 300mg  rectally 3.  Seizure precautions 4.  Keppra 1000mg  IV load now with 500mg  IV q 12hours as maintenance.   5.  EEG STAT 6.  Echocardiogram 7.  Carotid doppler 8.  Frequent neurochecks 9.  Telemetry 10. Would not aggressively control BP at this time 11. A1c, fasting lipid panel  This patient is critically ill and at significant risk of neurological worsening, death and care requires constant monitoring of vital signs, hemodynamics,respiratory and cardiac monitoring, neurological assessment, discussion with family, other specialists and medical decision making of high complexity. I spent 60 minutes of neurocritical care time  in the care of  this patient.   Thana Farr, MD [facility] [phone]  01/15/2014  5:08 PM

## 2014-01-16 NOTE — ED Notes (Signed)
PT demonstrating R sided seizure activity. MD notified and ordered 2mg  of Ativan

## 2014-01-16 NOTE — ED Notes (Signed)
Family found PT altered, prone. Per EMS seizing on arrival (witnessed L side seizres upon arrival). Reports HTN 200/100, sat 98% on non-rebreather, [id number]. Gave 2.5mg  midazolam and PT went unresponsive after adm. PT thinks she fell at 0830am, but family reports last seen normal yesterday evening. 18g IV R AC. No hx of seizures per family

## 2014-01-16 NOTE — Progress Notes (Signed)
This RN rrived in MRI to assume care of pt.  [facility].

## 2014-01-16 NOTE — ED Notes (Signed)
Half bag of versed drip wasted in med room sink with RN Zannie CoveMia Wyatt as witness.

## 2014-01-16 NOTE — Progress Notes (Signed)
Stat EEG completed.  Dr Reynolds aware. 

## 2014-01-16 NOTE — ED Notes (Signed)
MD at bedside. 

## 2014-01-16 NOTE — ED Notes (Signed)
R carotid stent placed 01/06/14. Son reports PT had been doing fine and was well after surgery. Son reports L carotid a. Completely blocked.

## 2014-01-16 NOTE — Procedures (Signed)
ELECTROENCEPHALOGRAM REPORT   Patient: Jacqueline StablerBarbara F Cline       Room #: TraC EEG No. ID: [id number] Age: 79 y.o.        Sex: female Referring Physician: Micheline Mazeocherty Report Date:  01/18/2014        Interpreting Physician: Thana FarrLeslie Ajiah Mcglinn  History: Jacqueline StablerBarbara F Cline is an 79 y.o. female with new onset seizure activity.  Now intubated and sedated  Medications:  Scheduled: . aspirin  324 mg Oral NOW   Or  . aspirin  300 mg Rectal NOW  . etomidate      . heparin  5,000 Units Subcutaneous 3 times per day  . lidocaine (cardiac) 100 mg/455ml      . midazolam      . rocuronium      . succinylcholine        Conditions of Recording:  This is a 16 channel EEG carried out with the patient in the intubated and sedated state.  Description:  The background activity over both hemispheres is slow and consist mostly of a low to moderate voltage polymorphic delta activity.  Superimposed over the right hemisphere is frequent sharp activity with phase reversal at T4.  At times this sharp activity is associated with short periods of attenuation lasting up to 4-5 seconds.   Sharp activity is not noted over the left hemisphere.  Hyperventilation and intermittent photic stimulation were not performed.    IMPRESSION: This is an abnormal EEG due to general background slowing.  Also noted over the right hemisphere is frequent epileptiform activity with phase reversal at T4.  This is consistent with the patient's history of new onset seizure and acute right MCA infarct.  No evidence of electrographic status epilepticus was noted.     Thana FarrLeslie Danyla Wattley, MD [facility] [phone] 01/29/2014, 6:25 PM

## 2014-01-16 NOTE — Progress Notes (Signed)
CRITICAL VALUE ALERT  Critical value received:  Troponin of 1.02  Date of notification:  01/12/2014  Time of notification:  2340  Critical value read back: Yes  Nurse who received alert:  Lily Peerhomas Halynn Reitano   MD notified (1st page):  Dr. Craige CottaSood  Time of first page:  2340  Responding MD:  Dr. Craige CottaSood  Time MD responded:  2340

## 2014-01-16 NOTE — ED Notes (Signed)
Witnessed Transport plannerBrenda RN waste 1/2 bag of versed drip in sink in med room per pharmacy

## 2014-01-16 NOTE — Progress Notes (Signed)
INITIAL NUTRITION ASSESSMENT  DOCUMENTATION CODES Per approved criteria  -Not Applicable   INTERVENTION: If pt remains intubated recommend   Initiate Vital High Protein @ 35 ml/hr  MVI daily  Tube feeding regimen provides 840 kcal, 73 grams of protein, and 702 ml of H2O.  TF regimen and current propofol rate provides 1143 total kcal (100% of kcal needs)  NUTRITION DIAGNOSIS: Inadequate oral intake related to inability to eat as evidenced by NPO status  Goal: Pt to meet >/= 90% of their estimated nutrition needs    Monitor:  Vent status, TF initiation and tolerance  Reason for Assessment: Ventilator  79 y.o. female  Admitting Dx: <principal problem not specified>  ASSESSMENT: Pt admitted with R MCA CVA, seizures, and respiratory failure.  Patient is currently intubated on ventilator support MV: 8.1 L/min Temp (24hrs), Avg:97.8 F (36.6 C), Min:96.8 F (36 C), Max:98.7 F (37.1 C)  Propofol: 11.5 ml/hr provides 303 kcal from lipid per day Noted some temporal wasting, per son pt had not ate much over the last few years but since a recent surgery pt has made an effort to eat much better/more.   Height: Ht Readings from Last 1 Encounters:  01/22/2014 5\' 4"  (1.626 m)    Weight: Wt Readings from Last 1 Encounters:  01/17/14 132 lb 11.5 oz (60.2 kg)  Admission weight 120 lb (54.7 kg)  Ideal Body Weight: 54.7 kg   % Ideal Body Weight: 100%  Wt Readings from Last 10 Encounters:  01/17/14 132 lb 11.5 oz (60.2 kg)  01/06/14 120 lb 8 oz (54.658 kg)  01/06/14 120 lb 8 oz (54.658 kg)  01/02/14 120 lb 8 oz (54.658 kg)  12/31/13 122 lb 12.8 oz (55.702 kg)  12/23/13 122 lb 4 oz (55.452 kg)  12/19/13 122 lb (55.339 kg)  03/17/13 126 lb 12 oz (57.493 kg)  04/08/12 126 lb (57.153 kg)  03/08/12 127 lb 12 oz (57.947 kg)    Usual Body Weight: 120 lb   % Usual Body Weight: 100%  BMI:  Body mass index is 22.77 kg/(m^2).  Estimated Nutritional Needs: Kcal:  1138 Protein: 65-75 grams Fluid: > 1.5 L/day  Skin: right neck incision  Diet Order: NPO  EDUCATION NEEDS: -No education needs identified at this time   Intake/Output Summary (Last 24 hours) at 01/17/14 0933 Last data filed at 01/17/14 0900  Gross per 24 hour  Intake 1249.72 ml  Output   1290 ml  Net -40.28 ml    Last BM: PTA   Labs:   Recent Labs Lab 01/18/2014 1402 01/15/2014 1423 01/17/14 0331  NA 144 142 141  K 4.1 3.9 4.1  CL 103 103 104  CO2 26  --  21  BUN 20 19 16   CREATININE 0.95 1.20* 0.61  CALCIUM 10.1  --  9.2  GLUCOSE 164* 163* 124*    CBG (last 3)   Recent Labs  01/27/2014 1351  GLUCAP 175*    Scheduled Meds: . antiseptic oral rinse  15 mL Mouth Rinse QID  . aspirin  324 mg Oral NOW   Or  . aspirin  300 mg Rectal NOW  . chlorhexidine  15 mL Mouth Rinse BID  . famotidine (PEPCID) IV  20 mg Intravenous Q24H  . heparin  5,000 Units Subcutaneous 3 times per day  . lacosamide (VIMPAT) IV  150 mg Intravenous Q12H  . valproic acid (DEPACON) IVPB  1,000 mg Intravenous Once  . valproic acid (DEPACON) IVPB  500 mg Intravenous 3  times per day    Continuous Infusions: . sodium chloride 75 mL/hr at 01/17/14 0613  . midazolam (VERSED) infusion Stopped (01/11/2014 1534)  . propofol 35 mcg/kg/min (01/17/14 0845)    Past Medical History  Diagnosis Date  . Hyperlipidemia 12/07/2004  . Hypertension 03/09/2004  . Carotid artery occlusion   . Pneumonia     hx  . Arthritis   . Heart murmur     mild AS/MR, mod TR 12/2013 echo    Past Surgical History  Procedure Laterality Date  . Orif ankle fracture  1995    Right ankle fracture  . Normal vaginal delivery  1962  . Eye surgery      cataract removed OD 01/05/2005//cataract Removed OS 03/14/2005  . Endarterectomy Right 01/06/2014    Procedure: RIGHT CAROTID ENDARTERECTOMY;  Surgeon: Chuck Hint, MD;  Location: [facility] OR;  Service: Vascular;  Laterality: Right;    Kendell Bane RD, LDN,  CNSC [phone] Pager [phone] After Hours Pager

## 2014-01-17 ENCOUNTER — Inpatient Hospital Stay (HOSPITAL_COMMUNITY): Payer: Medicare Other

## 2014-01-17 DIAGNOSIS — G40401 Other generalized epilepsy and epileptic syndromes, not intractable, with status epilepticus: Secondary | ICD-10-CM

## 2014-01-17 DIAGNOSIS — J9600 Acute respiratory failure, unspecified whether with hypoxia or hypercapnia: Secondary | ICD-10-CM

## 2014-01-17 DIAGNOSIS — I059 Rheumatic mitral valve disease, unspecified: Secondary | ICD-10-CM

## 2014-01-17 LAB — CBC
HEMATOCRIT: 38.8 % (ref 36.0–46.0)
Hemoglobin: 13.1 g/dL (ref 12.0–15.0)
MCH: 30 pg (ref 26.0–34.0)
MCHC: 33.8 g/dL (ref 30.0–36.0)
MCV: 89 fL (ref 78.0–100.0)
Platelets: 178 10*3/uL (ref 150–400)
RBC: 4.36 MIL/uL (ref 3.87–5.11)
RDW: 13.3 % (ref 11.5–15.5)
WBC: 12.8 10*3/uL — AB (ref 4.0–10.5)

## 2014-01-17 LAB — BASIC METABOLIC PANEL
BUN: 16 mg/dL (ref 6–23)
CHLORIDE: 104 meq/L (ref 96–112)
CO2: 21 meq/L (ref 19–32)
CREATININE: 0.61 mg/dL (ref 0.50–1.10)
Calcium: 9.2 mg/dL (ref 8.4–10.5)
GFR calc non Af Amer: 86 mL/min — ABNORMAL LOW (ref >90)
Glucose, Bld: 124 mg/dL — ABNORMAL HIGH (ref 70–99)
POTASSIUM: 4.1 meq/L (ref 3.7–5.3)
Sodium: 141 mEq/L (ref 137–147)

## 2014-01-17 LAB — TROPONIN I: Troponin I: 0.64 ng/mL (ref <0.30)

## 2014-01-17 LAB — GLUCOSE, CAPILLARY: Glucose-Capillary: 104 mg/dL — ABNORMAL HIGH (ref 70–99)

## 2014-01-17 MED ORDER — ADULT MULTIVITAMIN LIQUID CH
5.0000 mL | Freq: Every day | ORAL | Status: DC
  Administered 2014-01-17 – 2014-01-21 (×5): 5 mL via ORAL
  Filled 2014-01-17 (×5): qty 5

## 2014-01-17 MED ORDER — VITAL HIGH PROTEIN PO LIQD
1000.0000 mL | ORAL | Status: DC
  Filled 2014-01-17 (×2): qty 1000

## 2014-01-17 MED ORDER — VITAL HIGH PROTEIN PO LIQD
1000.0000 mL | ORAL | Status: DC
  Administered 2014-01-17 – 2014-01-21 (×6): 1000 mL
  Administered 2014-01-22 (×2)
  Administered 2014-01-22 – 2014-01-26 (×5): 1000 mL
  Filled 2014-01-17 (×13): qty 1000

## 2014-01-17 MED ORDER — VALPROATE SODIUM 500 MG/5ML IV SOLN
1000.0000 mg | Freq: Once | INTRAVENOUS | Status: AC
  Administered 2014-01-17: 1000 mg via INTRAVENOUS
  Filled 2014-01-17 (×2): qty 10

## 2014-01-17 MED ORDER — VALPROATE SODIUM 500 MG/5ML IV SOLN
500.0000 mg | Freq: Three times a day (TID) | INTRAVENOUS | Status: DC
  Administered 2014-01-17 – 2014-01-18 (×3): 500 mg via INTRAVENOUS
  Filled 2014-01-17 (×5): qty 5

## 2014-01-17 MED ORDER — LORAZEPAM 2 MG/ML IJ SOLN
1.0000 mg | Freq: Once | INTRAMUSCULAR | Status: AC
  Administered 2014-01-17: 1 mg via INTRAVENOUS
  Filled 2014-01-17: qty 1

## 2014-01-17 MED ORDER — SODIUM CHLORIDE 0.9 % IV SOLN
200.0000 mg | Freq: Once | INTRAVENOUS | Status: AC
  Administered 2014-01-17: 200 mg via INTRAVENOUS
  Filled 2014-01-17: qty 20

## 2014-01-17 MED ORDER — SODIUM CHLORIDE 0.9 % IV SOLN
100.0000 mg | Freq: Two times a day (BID) | INTRAVENOUS | Status: DC
  Filled 2014-01-17: qty 10

## 2014-01-17 MED ORDER — SODIUM CHLORIDE 0.9 % IV SOLN
150.0000 mg | Freq: Two times a day (BID) | INTRAVENOUS | Status: DC
  Administered 2014-01-17 – 2014-01-18 (×2): 150 mg via INTRAVENOUS
  Filled 2014-01-17 (×5): qty 15

## 2014-01-17 NOTE — Progress Notes (Signed)
EEG completed; results pending.    

## 2014-01-17 NOTE — Progress Notes (Signed)
LTM- Day 1 started. 

## 2014-01-17 NOTE — Procedures (Signed)
Intubation Procedure Note Jacqueline StablerBarbara F Cline [id number] 05/01/1935  Procedure: Intubation Indications: ETT needed to be changed  Procedure Details Consent: Unable to obtain consent because of emergent medical necessity. Time Out: Verified patient identification, verified procedure, site/side was marked, verified correct patient position, special equipment/implants available, medications/allergies/relevent history reviewed, required imaging and test results available.  Performed  Maximum sterile technique was used including gloves, hand hygiene and mask.  MAC and 3 standby Bougie used ,propofol bolus given,7.5 ETT secured at 23 mark, checked with Co2 detector, improved satn & auscultation    Evaluation Hemodynamic Status: BP stable throughout; O2 sats: transiently fell during during procedure Patient's Current Condition: stable Complications: No apparent complications Patient did tolerate procedure well. Chest X-ray ordered to verify placement.  CXR: pending.   [name] 01/17/2014

## 2014-01-17 NOTE — Progress Notes (Signed)
PULMONARY / CRITICAL CARE MEDICINE   Name: Jacqueline Cline MRN: [id number] DOB: Jan 31, 1935    ADMISSION DATE:  01/19/2014  REFERRING MD :  EDP  PRIMARY SERVICE: PCCM   CHIEF COMPLAINT:  CVA w AMS / Resp Fx  BRIEF PATIENT DESCRIPTION: 79 y/o F with recent R carotid stent placement who presented to [facility] ER on 4/10 with AMS & Seizures.  Found to have R MCA CVA, seizures, and acute respiratory failure.    SIGNIFICANT EVENTS:  4/10 - admit with R MCA CVA, seizures, resp failure  STUDIES: 4/10 - CT of head / neck >> acute moderate R MCA CVA without hemorrhagic conversion, remote R basal gaglia & right temporoccipital infarct, severe white matter changes. Neck without acute fracture.  LINES / TUBES: OETT 4/10>>>  CULTURES:   ANTIBIOTICS:   INTERVAL HX:  No acute change overnight.  afebrile  VITAL SIGNS: Temp:  [96.8 F (36 C)-98.7 F (37.1 C)] 98.2 F (36.8 C) (04/11 0748) Pulse Rate:  [64-95] 80 (04/11 0900) Resp:  [0-27] 22 (04/11 0900) BP: (126-212)/(58-109) 160/64 mmHg (04/11 0900) SpO2:  [98 %-100 %] 99 % (04/11 0900) FiO2 (%):  [30 %-100 %] 30 % (04/11 0900) Weight:  [120 lb 9.5 oz (54.7 kg)-132 lb 11.5 oz (60.2 kg)] 132 lb 11.5 oz (60.2 kg) (04/11 0500)  HEMODYNAMICS:    VENTILATOR SETTINGS: Vent Mode:  [-] PRVC FiO2 (%):  [30 %-100 %] 30 % Set Rate:  [14 bmp-18 bmp] 18 bmp Vt Set:  [460 mL] 460 mL PEEP:  [5 cmH20] 5 cmH20 Plateau Pressure:  [13 cmH20-16 cmH20] 14 cmH20  INTAKE / OUTPUT: Intake/Output     04/10 0701 - 04/11 0700 04/11 0701 - 04/12 0700   I.V. (mL/kg) 921.7 (15.3) 173 (2.9)   IV Piggyback 155    Total Intake(mL/kg) 1076.7 (17.9) 173 (2.9)   Urine (mL/kg/hr) 1225 65 (0.4)   Total Output 1225 65   Net -148.3 +108          PHYSICAL EXAMINATION: General:  Elderly female on vent, NAD Neuro: no response on propofol, L pupil 3mm sluggish, R 2mm no response  HEENT:  OETT Cardiovascular:  s1s2 rrr Lungs:  resp's even/non-labored on vent,  lungs bilaterally coarse Abdomen:  Round/soft, bsx4 active  Musculoskeletal:  No acute deformties Skin:  Warm/dry, no edema  LABS:  CBC  Recent Labs Lab 01/27/2014 1402 01/30/2014 1423 01/17/14 0331  WBC 18.9*  --  12.8*  HGB 14.0 14.6 13.1  HCT 42.2 43.0 38.8  PLT 247  --  178   Coag's  Recent Labs Lab 01/27/2014 1402  APTT 22*  INR 0.92   BMET  Recent Labs Lab 01/07/2014 1402 01/30/2014 1423 01/17/14 0331  NA 144 142 141  K 4.1 3.9 4.1  CL 103 103 104  CO2 26  --  21  BUN 20 19 16   CREATININE 0.95 1.20* 0.61  GLUCOSE 164* 163* 124*   Electrolytes  Recent Labs Lab 01/15/2014 1402 01/17/14 0331  CALCIUM 10.1 9.2   Sepsis Markers No results found for this basename: LATICACIDVEN, PROCALCITON, O2SATVEN,  in the last 168 hours ABG  Recent Labs Lab 01/20/2014 1434  PHART 7.368  PCO2ART 45.8*  PO2ART 220.0*   Liver Enzymes  Recent Labs Lab 01/22/2014 1402  AST 170*  ALT 172*  ALKPHOS 74  BILITOT 0.4  ALBUMIN 3.5   Cardiac Enzymes  Recent Labs Lab 01/22/2014 2215 01/17/14 0341  TROPONINI 1.02* 0.64*   Glucose  Recent  Labs Lab January 20, 2014 1351  GLUCAP 175*    Imaging Ct Head Wo Contrast  Jan 20, 2014   CLINICAL DATA:  Left-sided seizures, found down.  Left hemi paresis.  EXAM: CT HEAD WITHOUT CONTRAST  CT CERVICAL SPINE WITHOUT CONTRAST  TECHNIQUE: Multidetector CT imaging of the head and cervical spine was performed following the standard protocol without intravenous contrast. Multiplanar CT image reconstructions of the cervical spine were also generated.  COMPARISON:  CT HEAD W/O CM dated 12/30/2013  FINDINGS: CT HEAD FINDINGS  Right temporal loss of the great matter junction, axial 12/32, with equivocal right hyperdense MCA.  No acute intraparenchymal hemorrhage, mass effect or midline shift. Wedge-like hypodensity in right temporal occipital lobe appears somewhat worse though, this could be due to slice selection. Remote right basal ganglia/ corona  radiata infarct with ex vacuo dilatation right frontal horn/lateral ventricle again seen. Subcentimeter apparent calcification along the right subependymal margin appears slightly smaller which may be related to slice positioning. Moderate to severe patchy white matter hypodensities, similar.  No abnormal extra-axial fluid collections. Basal cisterns are patent. Moderate to severe calcific atherosclerosis of the carotid siphons.  CT CERVICAL SPINE FINDINGS  Cervical vertebral bodies are intact, with grade 1 C4-5 anterolisthesis on degenerative basis. Moderate to severe C5-6 and C6-7 degenerative disc disease, moderate at C3-4. C1-2 articulation maintained with severe arthropathy. Mild calcified pannus about the odontoid process likely reflects CPPD. Life-support lines with fluid layering in the nasopharynx. Surgical clips in the right neck suggests carotid endarterectomy, with soft tissue within the right carotid space. Calcific atherosclerosis of the origin of left internal carotid artery with central hypodensity, equivocal for occlusion.  Degenerative disc disease and facet arthropathy with small to moderate C 5 6 and C6-7 apparent disc protrusions. Mild canal stenosis C5-6. Severe right C4-5 neural foraminal narrowing.  IMPRESSION: CT head: Acute moderate right middle cerebral artery territory infarct, involving the right temporal lobe. No hemorrhagic conversion.  Remote right basal ganglia/corona radiata and remote right temporoccipital infarcts, the latter of which appears slightly larger though this could be technical. Moderate to severe white matter changes suggest chronic small vessel ischemic disease.  CT cervical spine: No acute cervical spine fracture. Grade 1 C4-5 anterolisthesis on degenerative basis.  Postoperative changes of the right carotid space suggests recent carotid endarterectomy. In addition, calcific atherosclerosis of the origin left internal carotid artery, with central hypodensity which  may be seen with occlusion.  Critical Value/emergent results were called by telephone at the time of interpretation on 2014-01-20 at 3:10 PM to Dr. Aundra Millet, [facility] , who verbally acknowledged these results.   Electronically Signed   By: Awilda Metro   On: 2014-01-20 15:17   Ct Cervical Spine Wo Contrast  2014-01-20   CLINICAL DATA:  Left-sided seizures, found down.  Left hemi paresis.  EXAM: CT HEAD WITHOUT CONTRAST  CT CERVICAL SPINE WITHOUT CONTRAST  TECHNIQUE: Multidetector CT imaging of the head and cervical spine was performed following the standard protocol without intravenous contrast. Multiplanar CT image reconstructions of the cervical spine were also generated.  COMPARISON:  CT HEAD W/O CM dated 12/30/2013  FINDINGS: CT HEAD FINDINGS  Right temporal loss of the great matter junction, axial 12/32, with equivocal right hyperdense MCA.  No acute intraparenchymal hemorrhage, mass effect or midline shift. Wedge-like hypodensity in right temporal occipital lobe appears somewhat worse though, this could be due to slice selection. Remote right basal ganglia/ corona radiata infarct with ex vacuo dilatation right frontal horn/lateral ventricle again seen. Subcentimeter apparent  calcification along the right subependymal margin appears slightly smaller which may be related to slice positioning. Moderate to severe patchy white matter hypodensities, similar.  No abnormal extra-axial fluid collections. Basal cisterns are patent. Moderate to severe calcific atherosclerosis of the carotid siphons.  CT CERVICAL SPINE FINDINGS  Cervical vertebral bodies are intact, with grade 1 C4-5 anterolisthesis on degenerative basis. Moderate to severe C5-6 and C6-7 degenerative disc disease, moderate at C3-4. C1-2 articulation maintained with severe arthropathy. Mild calcified pannus about the odontoid process likely reflects CPPD. Life-support lines with fluid layering in the nasopharynx. Surgical clips in the right neck  suggests carotid endarterectomy, with soft tissue within the right carotid space. Calcific atherosclerosis of the origin of left internal carotid artery with central hypodensity, equivocal for occlusion.  Degenerative disc disease and facet arthropathy with small to moderate C 5 6 and C6-7 apparent disc protrusions. Mild canal stenosis C5-6. Severe right C4-5 neural foraminal narrowing.  IMPRESSION: CT head: Acute moderate right middle cerebral artery territory infarct, involving the right temporal lobe. No hemorrhagic conversion.  Remote right basal ganglia/corona radiata and remote right temporoccipital infarcts, the latter of which appears slightly larger though this could be technical. Moderate to severe white matter changes suggest chronic small vessel ischemic disease.  CT cervical spine: No acute cervical spine fracture. Grade 1 C4-5 anterolisthesis on degenerative basis.  Postoperative changes of the right carotid space suggests recent carotid endarterectomy. In addition, calcific atherosclerosis of the origin left internal carotid artery, with central hypodensity which may be seen with occlusion.  Critical Value/emergent results were called by telephone at the time of interpretation on 01/21/2014 at 3:10 PM to Dr. Aundra Millet, [facility] , who verbally acknowledged these results.   Electronically Signed   By: Awilda Metro   On: 01/23/2014 15:17   Mr Maxine Glenn Head Wo Contrast  01/27/2014   CLINICAL DATA:  Post right carotid endarterectomy 01/06/2014. Found down. Hypertension. Hyperlipidemia.  EXAM: MRI HEAD WITHOUT AND WITH CONTRAST AND MRA HEAD WITHOUT AND WITH CONTRAST AND MRI NECK WITHOUT AND WITH CONTRAST  TECHNIQUE: Multiplanar, multiecho pulse sequences of the brain and surrounding structures were obtained without and with intravenous contrast. Angiographic images of the head were obtained using MRA technique without and with contrast. Multiplanar, multiecho pulse sequences of the neck and surrounding  structures were obtained without and with intravenous contrast.  CONTRAST:  10 cc MultiHance.  COMPARISON:  01/19/2014 and 12/30/2013 CT.  No comparison MR.  FINDINGS: MRI HEAD FINDINGS  Occluded left internal carotid artery.  Recent right carotid endarterectomy.  Large area of altered signal intensity involving the right frontal -parietal -occipital lobe and posterior right temporal lobe as well as left frontal and parietal lobe which demonstrates patchy enhancement. Small area of blood breakdown products right occipital lobe. Appearance is suggestive of the presence of subacute infarcts.  Altered signal intensity mid to anterior right temporal lobe, operculum region and subinsular region as well as involving the posterior right thalamus suggestive of acute infarct (infection such as herpes felt to be less likely consideration given the clinical setting).  Remote right coronal radiata/centrum semiovale and caudate head infarct with dilated right lateral ventricle and wallerian degeneration.  Atrophy without hydrocephalus.  No intracranial mass lesion seen separate from the above described findings.  Pooled secretions posterior superior nasopharynx.  Mild paranasal sinus mucosal thickening.  Cervical medullary junction, pituitary region, pineal region and orbital structures unremarkable.  MRA HEAD FINDINGS  Occluded left internal carotid artery. Reconstitution of flow at the left  carotid terminus is not adequate as there is poor flow seen within the left middle cerebral artery branches and at the level of the left carotid terminus.  Ectatic right internal carotid artery cavernous and supra clinoid segment. No saccular aneurysm separate from the atherosclerotic type changes.  Middle cerebral artery branch vessel mild irregularity.  Moderate narrowing left vertebral artery. Mild irregularity right vertebral artery.  Non visualization right posterior inferior cerebellar artery and left anterior inferior cerebellar  artery.  Mild narrowing and irregularity of the basilar artery which is ectatic.  Fetal type contribution to the right posterior cerebral artery. Mild to moderate narrowing and right posterior cerebral artery proximal to terminal branching.  MRI NECK FINDINGS  Three vessel aortic arch.  Left internal carotid artery is occluded.  Post right carotid endarterectomy remains patent. No high-grade stenosis although there is mild narrowing at the distal anastomotic site.  Right vertebral artery is dominant. Moderate narrowing of the origin of the vertebral arteries bilaterally greater on the right.  No high-grade stenosis of either subclavian artery.  IMPRESSION: MRI HEAD :  Occluded left internal carotid artery.  Recent right carotid endarterectomy.  Subacute enhancing large infarcts involving right frontal -parietal -occipital lobe and posterior right temporal lobe as well as left frontal and parietal lobe. Small area of blood breakdown products right occipital lobe.  Acute infarcts involving mid to anterior right temporal lobe, operculum region and subinsular region as well as involving the posterior right thalamus suggestive of acute infarct (infection such as herpes felt to be less likely consideration given the clinical setting).  Remote right coronal radiata/centrum semiovale and caudate head infarct with dilated right lateral ventricle and wallerian degeneration.  MRA HEAD:  Occluded left internal carotid artery. Reconstitution of flow at the left carotid terminus is not adequate as there is poor flow seen within the left middle cerebral artery branches and at the level of the left carotid terminus.  Ectatic right internal carotid artery cavernous and supra clinoid segment.  Middle cerebral artery branch vessel mild irregularity.  Moderate narrowing left vertebral artery. Mild irregularity right vertebral artery.  Non visualization right posterior inferior cerebellar artery and left anterior inferior cerebellar  artery.  Mild narrowing and irregularity of the basilar artery which is ectatic.  Mild to moderate narrowing and right posterior cerebral artery proximal to terminal branching.  MRI NECK:  Left internal carotid artery is occluded.  Post right carotid endarterectomy. No high-grade stenosis although there is mild narrowing at the distal anastomotic site.  Right vertebral artery is dominant. Moderate narrowing of the origin of the vertebral arteries bilaterally greater on the right.  Please see above for further detail.  These results were called by telephone at the time of interpretation on 01-23-14 at 7:52 PM to [facility] patients nurse who verbally acknowledged these results.   Electronically Signed   By: Bridgett Larsson M.D.   On: January 23, 2014 19:56   Mr Angiogram Neck W Wo Contrast  2014-01-23   CLINICAL DATA:  Post right carotid endarterectomy 01/06/2014. Found down. Hypertension. Hyperlipidemia.  EXAM: MRI HEAD WITHOUT AND WITH CONTRAST AND MRA HEAD WITHOUT AND WITH CONTRAST AND MRI NECK WITHOUT AND WITH CONTRAST  TECHNIQUE: Multiplanar, multiecho pulse sequences of the brain and surrounding structures were obtained without and with intravenous contrast. Angiographic images of the head were obtained using MRA technique without and with contrast. Multiplanar, multiecho pulse sequences of the neck and surrounding structures were obtained without and with intravenous contrast.  CONTRAST:  10 cc MultiHance.  COMPARISON:  02-03-2014 and 12/30/2013 CT.  No comparison MR.  FINDINGS: MRI HEAD FINDINGS  Occluded left internal carotid artery.  Recent right carotid endarterectomy.  Large area of altered signal intensity involving the right frontal -parietal -occipital lobe and posterior right temporal lobe as well as left frontal and parietal lobe which demonstrates patchy enhancement. Small area of blood breakdown products right occipital lobe. Appearance is suggestive of the presence of subacute infarcts.  Altered signal  intensity mid to anterior right temporal lobe, operculum region and subinsular region as well as involving the posterior right thalamus suggestive of acute infarct (infection such as herpes felt to be less likely consideration given the clinical setting).  Remote right coronal radiata/centrum semiovale and caudate head infarct with dilated right lateral ventricle and wallerian degeneration.  Atrophy without hydrocephalus.  No intracranial mass lesion seen separate from the above described findings.  Pooled secretions posterior superior nasopharynx.  Mild paranasal sinus mucosal thickening.  Cervical medullary junction, pituitary region, pineal region and orbital structures unremarkable.  MRA HEAD FINDINGS  Occluded left internal carotid artery. Reconstitution of flow at the left carotid terminus is not adequate as there is poor flow seen within the left middle cerebral artery branches and at the level of the left carotid terminus.  Ectatic right internal carotid artery cavernous and supra clinoid segment. No saccular aneurysm separate from the atherosclerotic type changes.  Middle cerebral artery branch vessel mild irregularity.  Moderate narrowing left vertebral artery. Mild irregularity right vertebral artery.  Non visualization right posterior inferior cerebellar artery and left anterior inferior cerebellar artery.  Mild narrowing and irregularity of the basilar artery which is ectatic.  Fetal type contribution to the right posterior cerebral artery. Mild to moderate narrowing and right posterior cerebral artery proximal to terminal branching.  MRI NECK FINDINGS  Three vessel aortic arch.  Left internal carotid artery is occluded.  Post right carotid endarterectomy remains patent. No high-grade stenosis although there is mild narrowing at the distal anastomotic site.  Right vertebral artery is dominant. Moderate narrowing of the origin of the vertebral arteries bilaterally greater on the right.  No high-grade  stenosis of either subclavian artery.  IMPRESSION: MRI HEAD :  Occluded left internal carotid artery.  Recent right carotid endarterectomy.  Subacute enhancing large infarcts involving right frontal -parietal -occipital lobe and posterior right temporal lobe as well as left frontal and parietal lobe. Small area of blood breakdown products right occipital lobe.  Acute infarcts involving mid to anterior right temporal lobe, operculum region and subinsular region as well as involving the posterior right thalamus suggestive of acute infarct (infection such as herpes felt to be less likely consideration given the clinical setting).  Remote right coronal radiata/centrum semiovale and caudate head infarct with dilated right lateral ventricle and wallerian degeneration.  MRA HEAD:  Occluded left internal carotid artery. Reconstitution of flow at the left carotid terminus is not adequate as there is poor flow seen within the left middle cerebral artery branches and at the level of the left carotid terminus.  Ectatic right internal carotid artery cavernous and supra clinoid segment.  Middle cerebral artery branch vessel mild irregularity.  Moderate narrowing left vertebral artery. Mild irregularity right vertebral artery.  Non visualization right posterior inferior cerebellar artery and left anterior inferior cerebellar artery.  Mild narrowing and irregularity of the basilar artery which is ectatic.  Mild to moderate narrowing and right posterior cerebral artery proximal to terminal branching.  MRI NECK:  Left internal carotid artery is  occluded.  Post right carotid endarterectomy. No high-grade stenosis although there is mild narrowing at the distal anastomotic site.  Right vertebral artery is dominant. Moderate narrowing of the origin of the vertebral arteries bilaterally greater on the right.  Please see above for further detail.  These results were called by telephone at the time of interpretation on 01/08/2014 at 7:52 PM  to [facility] patients nurse who verbally acknowledged these results.   Electronically Signed   By: Bridgett Larsson M.D.   On: 02/04/2014 19:56   Mr Laqueta Jean EA Contrast  02/02/2014   CLINICAL DATA:  Post right carotid endarterectomy 01/06/2014. Found down. Hypertension. Hyperlipidemia.  EXAM: MRI HEAD WITHOUT AND WITH CONTRAST AND MRA HEAD WITHOUT AND WITH CONTRAST AND MRI NECK WITHOUT AND WITH CONTRAST  TECHNIQUE: Multiplanar, multiecho pulse sequences of the brain and surrounding structures were obtained without and with intravenous contrast. Angiographic images of the head were obtained using MRA technique without and with contrast. Multiplanar, multiecho pulse sequences of the neck and surrounding structures were obtained without and with intravenous contrast.  CONTRAST:  10 cc MultiHance.  COMPARISON:  02/01/2014 and 12/30/2013 CT.  No comparison MR.  FINDINGS: MRI HEAD FINDINGS  Occluded left internal carotid artery.  Recent right carotid endarterectomy.  Large area of altered signal intensity involving the right frontal -parietal -occipital lobe and posterior right temporal lobe as well as left frontal and parietal lobe which demonstrates patchy enhancement. Small area of blood breakdown products right occipital lobe. Appearance is suggestive of the presence of subacute infarcts.  Altered signal intensity mid to anterior right temporal lobe, operculum region and subinsular region as well as involving the posterior right thalamus suggestive of acute infarct (infection such as herpes felt to be less likely consideration given the clinical setting).  Remote right coronal radiata/centrum semiovale and caudate head infarct with dilated right lateral ventricle and wallerian degeneration.  Atrophy without hydrocephalus.  No intracranial mass lesion seen separate from the above described findings.  Pooled secretions posterior superior nasopharynx.  Mild paranasal sinus mucosal thickening.  Cervical medullary junction,  pituitary region, pineal region and orbital structures unremarkable.  MRA HEAD FINDINGS  Occluded left internal carotid artery. Reconstitution of flow at the left carotid terminus is not adequate as there is poor flow seen within the left middle cerebral artery branches and at the level of the left carotid terminus.  Ectatic right internal carotid artery cavernous and supra clinoid segment. No saccular aneurysm separate from the atherosclerotic type changes.  Middle cerebral artery branch vessel mild irregularity.  Moderate narrowing left vertebral artery. Mild irregularity right vertebral artery.  Non visualization right posterior inferior cerebellar artery and left anterior inferior cerebellar artery.  Mild narrowing and irregularity of the basilar artery which is ectatic.  Fetal type contribution to the right posterior cerebral artery. Mild to moderate narrowing and right posterior cerebral artery proximal to terminal branching.  MRI NECK FINDINGS  Three vessel aortic arch.  Left internal carotid artery is occluded.  Post right carotid endarterectomy remains patent. No high-grade stenosis although there is mild narrowing at the distal anastomotic site.  Right vertebral artery is dominant. Moderate narrowing of the origin of the vertebral arteries bilaterally greater on the right.  No high-grade stenosis of either subclavian artery.  IMPRESSION: MRI HEAD :  Occluded left internal carotid artery.  Recent right carotid endarterectomy.  Subacute enhancing large infarcts involving right frontal -parietal -occipital lobe and posterior right temporal lobe as well as left frontal and parietal  lobe. Small area of blood breakdown products right occipital lobe.  Acute infarcts involving mid to anterior right temporal lobe, operculum region and subinsular region as well as involving the posterior right thalamus suggestive of acute infarct (infection such as herpes felt to be less likely consideration given the clinical  setting).  Remote right coronal radiata/centrum semiovale and caudate head infarct with dilated right lateral ventricle and wallerian degeneration.  MRA HEAD:  Occluded left internal carotid artery. Reconstitution of flow at the left carotid terminus is not adequate as there is poor flow seen within the left middle cerebral artery branches and at the level of the left carotid terminus.  Ectatic right internal carotid artery cavernous and supra clinoid segment.  Middle cerebral artery branch vessel mild irregularity.  Moderate narrowing left vertebral artery. Mild irregularity right vertebral artery.  Non visualization right posterior inferior cerebellar artery and left anterior inferior cerebellar artery.  Mild narrowing and irregularity of the basilar artery which is ectatic.  Mild to moderate narrowing and right posterior cerebral artery proximal to terminal branching.  MRI NECK:  Left internal carotid artery is occluded.  Post right carotid endarterectomy. No high-grade stenosis although there is mild narrowing at the distal anastomotic site.  Right vertebral artery is dominant. Moderate narrowing of the origin of the vertebral arteries bilaterally greater on the right.  Please see above for further detail.  These results were called by telephone at the time of interpretation on 01/21/2014 at 7:52 PM [facility] who verbally acknowledged these results.   Electronically Signed   By: Bridgett LarssonSteve  Olson M.D.   On: 01/10/2014 19:56   Dg Chest Port 1 View  01/17/2014   CLINICAL DATA:  Evaluate lungs and endotracheal tube.  EXAM: PORTABLE CHEST - 1 VIEW  COMPARISON:  01/30/2014  FINDINGS: Endotracheal tube tip now projects 5.7 cm above the carina, which is higher than on the prior study although this difference may be due to differences in patient positioning/neck position.  Orogastric tube has its tip in the distal esophagus. It does not extend below the diaphragm.  There has been significant improvement in  lung aeration. Interstitial thickening has significantly decreased. There is mild hazy opacity at the left lung base likely a small effusion. No areas of lung consolidation.  IMPRESSION: 1. Marked improvement in interstitial pulmonary edema. Small residual left effusion. 2. Endotracheal tube tip projects 5.7 cm above the carina. Orogastric tube tip projects in the distal esophagus. Support apparatus is felt to be unchanged allowing for differences in patient positioning.   Electronically Signed   By: Amie Portlandavid  Ormond M.D.   On: 01/17/2014 08:18   Dg Chest Portable 1 View  01/07/2014   CLINICAL DATA:  Seizure.  Endotracheal tube placement.  EXAM: PORTABLE CHEST - 1 VIEW  COMPARISON:  01/02/2014  FINDINGS: Endotracheal tube has its tip 4 cm above the carina. Nasogastric tube has its tip can a hiatal hernia above the level of the diaphragm. There is interstitial and early alveolar pulmonary edema. No effusion.  IMPRESSION: Acute pulmonary edema.  Endotracheal tube well positioned.  Nasogastric tube tip in a hiatal hernia, above the level of the diaphragm.   Electronically Signed   By: Paulina FusiMark  Shogry M.D.   On: 01/21/2014 14:17    ASSESSMENT / PLAN:  NEUROLOGIC A:   R MCA CVA, likely embolic stent related? Seizures Acute Encephalopathy  ?PRES P:   -Keppra, vimpat IV, further dosing per pharmacy  -Neurology following -c EEG 4/11 -ASA -carotid  dopplers (per neuro) -propofol for sedation, PRN fentanyl for pain  -serial neuro checks   PULMONARY A: Acute Respiratory Failure - in setting of CVA R/O Aspiration  P:   -cont full vent support -trend CXR -PRN albuterol   CARDIOVASCULAR A:  Mild Elevation Troponin - trending down  CAD HTN Emergency Carotid Occlusion (L) Carotid Stent (R, on 3/31) P:  -ASA -DNR in event arrest  -trend troponin  -BP control - currently managed on propofol   RENAL A:   Acute Kidney Injury - mild elevation in sr cr P:   -gentle hydration -no free  water -allow na to rise  GASTROINTESTINAL A:   NPO P:   -OGT -consider nutrition in am 4/11  HEMATOLOGIC A:   No acute process  P:  -monitor CBC -heparin for DVT prophylaxis   INFECTIOUS A:   Concern for Aspiration Injury - mild bilateral interstitial changes  P:   -monitor fever curve / leukocytosis -hold abx for now -trend cxr  ENDOCRINE A:   No acute issues   P:   -monitor BMP glucose trends  GLOBAL: -extensive discussion with family at bedside regarding current clinical status.  Family wishes for DNR status in the event of arrest.  Will continue current therapies.  Will re eval in 72 hrs, if not progressing , she would have wanted comfort care.   Bernadene Person, NP 01/17/2014  9:43 AM Pager: [phone] or [phone]   I have personally obtained a history, examined the patient, evaluated laboratory and imaging results, formulated the assessment and plan and placed orders.  CRITICAL CARE: The patient is critically ill with multiple organ systems failure and requires high complexity decision making for assessment and support, frequent evaluation and titration of therapies, application of advanced monitoring technologies and extensive interpretation of multiple databases. Critical Care Time devoted to patient care services described in this note is ____ minutes.   Cyril Mourning MD. Tonny Bollman. [facility] Pager [phone] If no response call [phone]

## 2014-01-17 NOTE — Procedures (Signed)
ELECTROENCEPHALOGRAM REPORT   Patient: Jacqueline StablerBarbara F Cline       Room #: [id number] EEG No. ID: [id number] Age: 79 y.o.        Sex: female Referring Physician: Anne HahnWillis Report Date:  01/17/2014        Interpreting Physician: Thana FarrLeslie Cheril Slattery  History: Jacqueline StablerBarbara F Cline is an 79 y.o. female found down with seizure activity.  Now intubated and sedated. Medications:  Scheduled: . antiseptic oral rinse  15 mL Mouth Rinse QID  . aspirin  324 mg Oral NOW   Or  . aspirin  300 mg Rectal NOW  . chlorhexidine  15 mL Mouth Rinse BID  . famotidine (PEPCID) IV  20 mg Intravenous Q24H  . feeding supplement (VITAL HIGH PROTEIN)  1,000 mL Per Tube Q24H  . heparin  5,000 Units Subcutaneous 3 times per day  . lacosamide (VIMPAT) IV  150 mg Intravenous Q12H  . multivitamin  5 mL Oral Daily  . valproic acid (DEPACON) IVPB  500 mg Intravenous 3 times per day    Conditions of Recording:  This is a 16 channel EEG carried out with the patient in the intubated and sedated state.  Description:  The background activity consists is asymmetric.  The left hemispheric background activity is slow and consists mostly of polymorphic delta activity that is of low to moderate voltage and continuous.  At times there is superimposed beta activity that resembles sleep spindles.   The right hemisphere consists initially of bursts of high voltage slow activity with embedded sharp activity emanating from the right temporal region.  These bursts lasted up to 5 seconds.  They alternated with periods of attenuation that lasted from 5 to 10 seconds.  This activity progressed quickly to a background that was dominated by the high voltage burst rhythm.  The periods of attenuation were shorter and less frequent.  There were periods of head jerking during the tracing that seemed to coincide with the prolonged periods of high voltage activity and suggest clinical seizure activity.  Hyperventilation and intermittent photic stimulation were not  performed.   IMPRESSION: This is an abnormal EEG due to clinical and electrographic seizure activity emanating from the right temporal lobe.    COMMENT: Results discussed with Dr. Genella MechWillis   Madeline Pho, MD [facility] [phone] 01/17/2014, 1:24 PM

## 2014-01-17 NOTE — Progress Notes (Signed)
  Echocardiogram 2D Echocardiogram has been performed.  Jacqueline Cline 01/17/2014, 11:33 AM

## 2014-01-17 NOTE — Progress Notes (Signed)
Stroke Team Progress Note  HISTORY   Jacqueline Cline is an 79 y.o. female with known left complete carotid occlusion who recently underwent a right carotid stent on 01/06/14. Per son she ws doing very well, BP controled and lived alone. Her son had called patient at 8 PM last night and she held a normal conversation. He came to pick her up for a Dr. Appointment today at 12:30 and found her face down on the floor with left arm and leg jerking. He states she was awake and able to speak to him. She made mention she had fallen at 8 am this morning. On arrival patient had witnessed left sided seizure, BP 200/100 and was given 2.5 mg Versed. After versed administered she was noted to have airway compromise and intubated. Per note patient then demonstrated right sided seizure activity and was given 2 mg Ativan. Currently she is intubated and sedated on Propofol. She follows no commands and shows no sign of clinical seizure.  Date last known well: Date: 01/15/2014  Time last known well: Time: 20:00  tPA Given: No: out of window   SUBJECTIVE The patient is intubated, sedated. Intermittent jerking of the head to the left is noted. Family is at bedside.  OBJECTIVE Most recent Vital Signs: Filed Vitals:   01/17/14 0600 01/17/14 0700 01/17/14 0748 01/17/14 0755  BP: 160/67 168/73  126/60  Pulse: 76 79  87  Temp:   98.2 F (36.8 C)   TempSrc:   Axillary   Resp: 18 18  27   Height:      Weight:      SpO2: 100% 100%  100%   CBG (last 3)   Recent Labs  02/04/2014 1351  GLUCAP 175*    IV Fluid Intake:   . sodium chloride 75 mL/hr at 01/17/14 0613  . midazolam (VERSED) infusion Stopped (01/09/2014 1534)  . propofol 25 mcg/kg/min (01/17/14 0512)    MEDICATIONS  . antiseptic oral rinse  15 mL Mouth Rinse QID  . aspirin  324 mg Oral NOW   Or  . aspirin  300 mg Rectal NOW  . chlorhexidine  15 mL Mouth Rinse BID  . famotidine (PEPCID) IV  20 mg Intravenous Q24H  . heparin  5,000 Units Subcutaneous  3 times per day  . lacosamide (VIMPAT) IV  100 mg Intravenous Q12H  . levETIRAcetam  500 mg Intravenous Q12H   PRN:  sodium chloride, albuterol, etomidate, fentaNYL, rocuronium  Diet:  NPO  Activity:  Bedrest DVT Prophylaxis:  SQ heparin  CLINICALLY SIGNIFICANT STUDIES Basic Metabolic Panel:  Recent Labs Lab 02/03/2014 1402 01/23/2014 1423 01/17/14 0331  NA 144 142 141  K 4.1 3.9 4.1  CL 103 103 104  CO2 26  --  21  GLUCOSE 164* 163* 124*  BUN 20 19 16   CREATININE 0.95 1.20* 0.61  CALCIUM 10.1  --  9.2   Liver Function Tests:  Recent Labs Lab 02/05/2014 1402  AST 170*  ALT 172*  ALKPHOS 74  BILITOT 0.4  PROT 7.1  ALBUMIN 3.5   CBC:  Recent Labs Lab 01/09/2014 1402 01/30/2014 1423 01/17/14 0331  WBC 18.9*  --  12.8*  NEUTROABS 16.7*  --   --   HGB 14.0 14.6 13.1  HCT 42.2 43.0 38.8  MCV 91.3  --  89.0  PLT 247  --  178   Coagulation:  Recent Labs Lab 01/23/2014 1402  LABPROT 12.2  INR 0.92   Cardiac Enzymes:  Recent Labs Lab  01/11/2014 1402 01/19/2014 2215 01/17/14 0341  CKTOTAL 242*  --   --   TROPONINI  --  1.02* 0.64*   Urinalysis:  Recent Labs Lab 02/03/2014 1405  COLORURINE YELLOW  LABSPEC 1.022  PHURINE 5.5  GLUCOSEU NEGATIVE  HGBUR NEGATIVE  BILIRUBINUR NEGATIVE  KETONESUR NEGATIVE  PROTEINUR 30*  UROBILINOGEN 0.2  NITRITE NEGATIVE  LEUKOCYTESUR NEGATIVE   Lipid Panel    Component Value Date/Time   CHOL 263* 03/10/2013 0916   TRIG 95 02/01/2014 2215   HDL 48.50 03/10/2013 0916   CHOLHDL 5 03/10/2013 0916   VLDL 25.2 03/10/2013 0916   LDLCALC 106* 07/21/2010 0908   HgbA1C  No results found for this basename: HGBA1C    Urine Drug Screen:     Component Value Date/Time   LABOPIA NONE DETECTED 02/03/2014 1405   COCAINSCRNUR NONE DETECTED 01/15/2014 1405   LABBENZ POSITIVE* 01/07/2014 1405   AMPHETMU NONE DETECTED 01/30/2014 1405   THCU NONE DETECTED 01/25/2014 1405   LABBARB NONE DETECTED 01/08/2014 1405    Alcohol Level:  Recent Labs Lab  01/29/2014 1402  ETH <11    Ct Head Wo Contrast  01/23/2014   CLINICAL DATA:  Left-sided seizures, found down.  Left hemi paresis.  EXAM: CT HEAD WITHOUT CONTRAST  CT CERVICAL SPINE WITHOUT CONTRAST  TECHNIQUE: Multidetector CT imaging of the head and cervical spine was performed following the standard protocol without intravenous contrast. Multiplanar CT image reconstructions of the cervical spine were also generated.  COMPARISON:  CT HEAD W/O CM dated 12/30/2013  FINDINGS: CT HEAD FINDINGS  Right temporal loss of the great matter junction, axial 12/32, with equivocal right hyperdense MCA.  No acute intraparenchymal hemorrhage, mass effect or midline shift. Wedge-like hypodensity in right temporal occipital lobe appears somewhat worse though, this could be due to slice selection. Remote right basal ganglia/ corona radiata infarct with ex vacuo dilatation right frontal horn/lateral ventricle again seen. Subcentimeter apparent calcification along the right subependymal margin appears slightly smaller which may be related to slice positioning. Moderate to severe patchy white matter hypodensities, similar.  No abnormal extra-axial fluid collections. Basal cisterns are patent. Moderate to severe calcific atherosclerosis of the carotid siphons.  CT CERVICAL SPINE FINDINGS  Cervical vertebral bodies are intact, with grade 1 C4-5 anterolisthesis on degenerative basis. Moderate to severe C5-6 and C6-7 degenerative disc disease, moderate at C3-4. C1-2 articulation maintained with severe arthropathy. Mild calcified pannus about the odontoid process likely reflects CPPD. Life-support lines with fluid layering in the nasopharynx. Surgical clips in the right neck suggests carotid endarterectomy, with soft tissue within the right carotid space. Calcific atherosclerosis of the origin of left internal carotid artery with central hypodensity, equivocal for occlusion.  Degenerative disc disease and facet arthropathy with small to  moderate C 5 6 and C6-7 apparent disc protrusions. Mild canal stenosis C5-6. Severe right C4-5 neural foraminal narrowing.  IMPRESSION: CT head: Acute moderate right middle cerebral artery territory infarct, involving the right temporal lobe. No hemorrhagic conversion.  Remote right basal ganglia/corona radiata and remote right temporoccipital infarcts, the latter of which appears slightly larger though this could be technical. Moderate to severe white matter changes suggest chronic small vessel ischemic disease.  CT cervical spine: No acute cervical spine fracture. Grade 1 C4-5 anterolisthesis on degenerative basis.  Postoperative changes of the right carotid space suggests recent carotid endarterectomy. In addition, calcific atherosclerosis of the origin left internal carotid artery, with central hypodensity which may be seen with occlusion.  Critical Value/emergent results  were called by telephone at the time of interpretation on 01/25/2014 at 3:10 PM to Dr. Aundra Millet, [facility] , who verbally acknowledged these results.   Electronically Signed   By: Awilda Metro   On: 01/25/2014 15:17   Ct Cervical Spine Wo Contrast  01/27/2014   CLINICAL DATA:  Left-sided seizures, found down.  Left hemi paresis.  EXAM: CT HEAD WITHOUT CONTRAST  CT CERVICAL SPINE WITHOUT CONTRAST  TECHNIQUE: Multidetector CT imaging of the head and cervical spine was performed following the standard protocol without intravenous contrast. Multiplanar CT image reconstructions of the cervical spine were also generated.  COMPARISON:  CT HEAD W/O CM dated 12/30/2013  FINDINGS: CT HEAD FINDINGS  Right temporal loss of the great matter junction, axial 12/32, with equivocal right hyperdense MCA.  No acute intraparenchymal hemorrhage, mass effect or midline shift. Wedge-like hypodensity in right temporal occipital lobe appears somewhat worse though, this could be due to slice selection. Remote right basal ganglia/ corona radiata infarct with ex vacuo  dilatation right frontal horn/lateral ventricle again seen. Subcentimeter apparent calcification along the right subependymal margin appears slightly smaller which may be related to slice positioning. Moderate to severe patchy white matter hypodensities, similar.  No abnormal extra-axial fluid collections. Basal cisterns are patent. Moderate to severe calcific atherosclerosis of the carotid siphons.  CT CERVICAL SPINE FINDINGS  Cervical vertebral bodies are intact, with grade 1 C4-5 anterolisthesis on degenerative basis. Moderate to severe C5-6 and C6-7 degenerative disc disease, moderate at C3-4. C1-2 articulation maintained with severe arthropathy. Mild calcified pannus about the odontoid process likely reflects CPPD. Life-support lines with fluid layering in the nasopharynx. Surgical clips in the right neck suggests carotid endarterectomy, with soft tissue within the right carotid space. Calcific atherosclerosis of the origin of left internal carotid artery with central hypodensity, equivocal for occlusion.  Degenerative disc disease and facet arthropathy with small to moderate C 5 6 and C6-7 apparent disc protrusions. Mild canal stenosis C5-6. Severe right C4-5 neural foraminal narrowing.  IMPRESSION: CT head: Acute moderate right middle cerebral artery territory infarct, involving the right temporal lobe. No hemorrhagic conversion.  Remote right basal ganglia/corona radiata and remote right temporoccipital infarcts, the latter of which appears slightly larger though this could be technical. Moderate to severe white matter changes suggest chronic small vessel ischemic disease.  CT cervical spine: No acute cervical spine fracture. Grade 1 C4-5 anterolisthesis on degenerative basis.  Postoperative changes of the right carotid space suggests recent carotid endarterectomy. In addition, calcific atherosclerosis of the origin left internal carotid artery, with central hypodensity which may be seen with occlusion.   Critical Value/emergent results were called by telephone at the time of interpretation on 01/13/2014 at 3:10 PM to Dr. Aundra Millet, [facility] , who verbally acknowledged these results.   Electronically Signed   By: Awilda Metro   On: 01/23/2014 15:17   Mr Maxine Glenn Head Wo Contrast  01/08/2014   CLINICAL DATA:  Post right carotid endarterectomy 01/06/2014. Found down. Hypertension. Hyperlipidemia.  EXAM: MRI HEAD WITHOUT AND WITH CONTRAST AND MRA HEAD WITHOUT AND WITH CONTRAST AND MRI NECK WITHOUT AND WITH CONTRAST  TECHNIQUE: Multiplanar, multiecho pulse sequences of the brain and surrounding structures were obtained without and with intravenous contrast. Angiographic images of the head were obtained using MRA technique without and with contrast. Multiplanar, multiecho pulse sequences of the neck and surrounding structures were obtained without and with intravenous contrast.  CONTRAST:  10 cc MultiHance.  COMPARISON:  01/14/2014 and 12/30/2013 CT.  No comparison  MR.  FINDINGS: MRI HEAD FINDINGS  Occluded left internal carotid artery.  Recent right carotid endarterectomy.  Large area of altered signal intensity involving the right frontal -parietal -occipital lobe and posterior right temporal lobe as well as left frontal and parietal lobe which demonstrates patchy enhancement. Small area of blood breakdown products right occipital lobe. Appearance is suggestive of the presence of subacute infarcts.  Altered signal intensity mid to anterior right temporal lobe, operculum region and subinsular region as well as involving the posterior right thalamus suggestive of acute infarct (infection such as herpes felt to be less likely consideration given the clinical setting).  Remote right coronal radiata/centrum semiovale and caudate head infarct with dilated right lateral ventricle and wallerian degeneration.  Atrophy without hydrocephalus.  No intracranial mass lesion seen separate from the above described findings.  Pooled  secretions posterior superior nasopharynx.  Mild paranasal sinus mucosal thickening.  Cervical medullary junction, pituitary region, pineal region and orbital structures unremarkable.  MRA HEAD FINDINGS  Occluded left internal carotid artery. Reconstitution of flow at the left carotid terminus is not adequate as there is poor flow seen within the left middle cerebral artery branches and at the level of the left carotid terminus.  Ectatic right internal carotid artery cavernous and supra clinoid segment. No saccular aneurysm separate from the atherosclerotic type changes.  Middle cerebral artery branch vessel mild irregularity.  Moderate narrowing left vertebral artery. Mild irregularity right vertebral artery.  Non visualization right posterior inferior cerebellar artery and left anterior inferior cerebellar artery.  Mild narrowing and irregularity of the basilar artery which is ectatic.  Fetal type contribution to the right posterior cerebral artery. Mild to moderate narrowing and right posterior cerebral artery proximal to terminal branching.  MRI NECK FINDINGS  Three vessel aortic arch.  Left internal carotid artery is occluded.  Post right carotid endarterectomy remains patent. No high-grade stenosis although there is mild narrowing at the distal anastomotic site.  Right vertebral artery is dominant. Moderate narrowing of the origin of the vertebral arteries bilaterally greater on the right.  No high-grade stenosis of either subclavian artery.  IMPRESSION: MRI HEAD :  Occluded left internal carotid artery.  Recent right carotid endarterectomy.  Subacute enhancing large infarcts involving right frontal -parietal -occipital lobe and posterior right temporal lobe as well as left frontal and parietal lobe. Small area of blood breakdown products right occipital lobe.  Acute infarcts involving mid to anterior right temporal lobe, operculum region and subinsular region as well as involving the posterior right thalamus  suggestive of acute infarct (infection such as herpes felt to be less likely consideration given the clinical setting).  Remote right coronal radiata/centrum semiovale and caudate head infarct with dilated right lateral ventricle and wallerian degeneration.  MRA HEAD:  Occluded left internal carotid artery. Reconstitution of flow at the left carotid terminus is not adequate as there is poor flow seen within the left middle cerebral artery branches and at the level of the left carotid terminus.  Ectatic right internal carotid artery cavernous and supra clinoid segment.  Middle cerebral artery branch vessel mild irregularity.  Moderate narrowing left vertebral artery. Mild irregularity right vertebral artery.  Non visualization right posterior inferior cerebellar artery and left anterior inferior cerebellar artery.  Mild narrowing and irregularity of the basilar artery which is ectatic.  Mild to moderate narrowing and right posterior cerebral artery proximal to terminal branching.  MRI NECK:  Left internal carotid artery is occluded.  Post right carotid endarterectomy. No high-grade stenosis  although there is mild narrowing at the distal anastomotic site.  Right vertebral artery is dominant. Moderate narrowing of the origin of the vertebral arteries bilaterally greater on the right.  Please see above for further detail.  These results were called by telephone at the time of interpretation on 2014/02/15 at 7:52 PM to [facility] patients nurse who verbally acknowledged these results.   Electronically Signed   By: Bridgett Larsson M.D.   On: 2014/02/15 19:56   Mr Angiogram Neck W Wo Contrast  02-15-14   CLINICAL DATA:  Post right carotid endarterectomy 01/06/2014. Found down. Hypertension. Hyperlipidemia.  EXAM: MRI HEAD WITHOUT AND WITH CONTRAST AND MRA HEAD WITHOUT AND WITH CONTRAST AND MRI NECK WITHOUT AND WITH CONTRAST  TECHNIQUE: Multiplanar, multiecho pulse sequences of the brain and surrounding structures were  obtained without and with intravenous contrast. Angiographic images of the head were obtained using MRA technique without and with contrast. Multiplanar, multiecho pulse sequences of the neck and surrounding structures were obtained without and with intravenous contrast.  CONTRAST:  10 cc MultiHance.  COMPARISON:  2014-02-15 and 12/30/2013 CT.  No comparison MR.  FINDINGS: MRI HEAD FINDINGS  Occluded left internal carotid artery.  Recent right carotid endarterectomy.  Large area of altered signal intensity involving the right frontal -parietal -occipital lobe and posterior right temporal lobe as well as left frontal and parietal lobe which demonstrates patchy enhancement. Small area of blood breakdown products right occipital lobe. Appearance is suggestive of the presence of subacute infarcts.  Altered signal intensity mid to anterior right temporal lobe, operculum region and subinsular region as well as involving the posterior right thalamus suggestive of acute infarct (infection such as herpes felt to be less likely consideration given the clinical setting).  Remote right coronal radiata/centrum semiovale and caudate head infarct with dilated right lateral ventricle and wallerian degeneration.  Atrophy without hydrocephalus.  No intracranial mass lesion seen separate from the above described findings.  Pooled secretions posterior superior nasopharynx.  Mild paranasal sinus mucosal thickening.  Cervical medullary junction, pituitary region, pineal region and orbital structures unremarkable.  MRA HEAD FINDINGS  Occluded left internal carotid artery. Reconstitution of flow at the left carotid terminus is not adequate as there is poor flow seen within the left middle cerebral artery branches and at the level of the left carotid terminus.  Ectatic right internal carotid artery cavernous and supra clinoid segment. No saccular aneurysm separate from the atherosclerotic type changes.  Middle cerebral artery branch vessel  mild irregularity.  Moderate narrowing left vertebral artery. Mild irregularity right vertebral artery.  Non visualization right posterior inferior cerebellar artery and left anterior inferior cerebellar artery.  Mild narrowing and irregularity of the basilar artery which is ectatic.  Fetal type contribution to the right posterior cerebral artery. Mild to moderate narrowing and right posterior cerebral artery proximal to terminal branching.  MRI NECK FINDINGS  Three vessel aortic arch.  Left internal carotid artery is occluded.  Post right carotid endarterectomy remains patent. No high-grade stenosis although there is mild narrowing at the distal anastomotic site.  Right vertebral artery is dominant. Moderate narrowing of the origin of the vertebral arteries bilaterally greater on the right.  No high-grade stenosis of either subclavian artery.  IMPRESSION: MRI HEAD :  Occluded left internal carotid artery.  Recent right carotid endarterectomy.  Subacute enhancing large infarcts involving right frontal -parietal -occipital lobe and posterior right temporal lobe as well as left frontal and parietal lobe. Small area of blood breakdown products right  occipital lobe.  Acute infarcts involving mid to anterior right temporal lobe, operculum region and subinsular region as well as involving the posterior right thalamus suggestive of acute infarct (infection such as herpes felt to be less likely consideration given the clinical setting).  Remote right coronal radiata/centrum semiovale and caudate head infarct with dilated right lateral ventricle and wallerian degeneration.  MRA HEAD:  Occluded left internal carotid artery. Reconstitution of flow at the left carotid terminus is not adequate as there is poor flow seen within the left middle cerebral artery branches and at the level of the left carotid terminus.  Ectatic right internal carotid artery cavernous and supra clinoid segment.  Middle cerebral artery branch vessel  mild irregularity.  Moderate narrowing left vertebral artery. Mild irregularity right vertebral artery.  Non visualization right posterior inferior cerebellar artery and left anterior inferior cerebellar artery.  Mild narrowing and irregularity of the basilar artery which is ectatic.  Mild to moderate narrowing and right posterior cerebral artery proximal to terminal branching.  MRI NECK:  Left internal carotid artery is occluded.  Post right carotid endarterectomy. No high-grade stenosis although there is mild narrowing at the distal anastomotic site.  Right vertebral artery is dominant. Moderate narrowing of the origin of the vertebral arteries bilaterally greater on the right.  Please see above for further detail.  These results were called by telephone at the time of interpretation on 2014-09-16 at 7:52 PM to [facility] patients nurse who verbally acknowledged these results.   Electronically Signed   By: Bridgett LarssonSteve  Olson M.D.   On: 02015-12-09 19:56   Mr Laqueta JeanBrain W WJWo Contrast  2014-09-16   CLINICAL DATA:  Post right carotid endarterectomy 01/06/2014. Found down. Hypertension. Hyperlipidemia.  EXAM: MRI HEAD WITHOUT AND WITH CONTRAST AND MRA HEAD WITHOUT AND WITH CONTRAST AND MRI NECK WITHOUT AND WITH CONTRAST  TECHNIQUE: Multiplanar, multiecho pulse sequences of the brain and surrounding structures were obtained without and with intravenous contrast. Angiographic images of the head were obtained using MRA technique without and with contrast. Multiplanar, multiecho pulse sequences of the neck and surrounding structures were obtained without and with intravenous contrast.  CONTRAST:  10 cc MultiHance.  COMPARISON:  02015-12-09 and 12/30/2013 CT.  No comparison MR.  FINDINGS: MRI HEAD FINDINGS  Occluded left internal carotid artery.  Recent right carotid endarterectomy.  Large area of altered signal intensity involving the right frontal -parietal -occipital lobe and posterior right temporal lobe as well as left frontal and  parietal lobe which demonstrates patchy enhancement. Small area of blood breakdown products right occipital lobe. Appearance is suggestive of the presence of subacute infarcts.  Altered signal intensity mid to anterior right temporal lobe, operculum region and subinsular region as well as involving the posterior right thalamus suggestive of acute infarct (infection such as herpes felt to be less likely consideration given the clinical setting).  Remote right coronal radiata/centrum semiovale and caudate head infarct with dilated right lateral ventricle and wallerian degeneration.  Atrophy without hydrocephalus.  No intracranial mass lesion seen separate from the above described findings.  Pooled secretions posterior superior nasopharynx.  Mild paranasal sinus mucosal thickening.  Cervical medullary junction, pituitary region, pineal region and orbital structures unremarkable.  MRA HEAD FINDINGS  Occluded left internal carotid artery. Reconstitution of flow at the left carotid terminus is not adequate as there is poor flow seen within the left middle cerebral artery branches and at the level of the left carotid terminus.  Ectatic right internal carotid artery cavernous and supra  clinoid segment. No saccular aneurysm separate from the atherosclerotic type changes.  Middle cerebral artery branch vessel mild irregularity.  Moderate narrowing left vertebral artery. Mild irregularity right vertebral artery.  Non visualization right posterior inferior cerebellar artery and left anterior inferior cerebellar artery.  Mild narrowing and irregularity of the basilar artery which is ectatic.  Fetal type contribution to the right posterior cerebral artery. Mild to moderate narrowing and right posterior cerebral artery proximal to terminal branching.  MRI NECK FINDINGS  Three vessel aortic arch.  Left internal carotid artery is occluded.  Post right carotid endarterectomy remains patent. No high-grade stenosis although there is  mild narrowing at the distal anastomotic site.  Right vertebral artery is dominant. Moderate narrowing of the origin of the vertebral arteries bilaterally greater on the right.  No high-grade stenosis of either subclavian artery.  IMPRESSION: MRI HEAD :  Occluded left internal carotid artery.  Recent right carotid endarterectomy.  Subacute enhancing large infarcts involving right frontal -parietal -occipital lobe and posterior right temporal lobe as well as left frontal and parietal lobe. Small area of blood breakdown products right occipital lobe.  Acute infarcts involving mid to anterior right temporal lobe, operculum region and subinsular region as well as involving the posterior right thalamus suggestive of acute infarct (infection such as herpes felt to be less likely consideration given the clinical setting).  Remote right coronal radiata/centrum semiovale and caudate head infarct with dilated right lateral ventricle and wallerian degeneration.  MRA HEAD:  Occluded left internal carotid artery. Reconstitution of flow at the left carotid terminus is not adequate as there is poor flow seen within the left middle cerebral artery branches and at the level of the left carotid terminus.  Ectatic right internal carotid artery cavernous and supra clinoid segment.  Middle cerebral artery branch vessel mild irregularity.  Moderate narrowing left vertebral artery. Mild irregularity right vertebral artery.  Non visualization right posterior inferior cerebellar artery and left anterior inferior cerebellar artery.  Mild narrowing and irregularity of the basilar artery which is ectatic.  Mild to moderate narrowing and right posterior cerebral artery proximal to terminal branching.  MRI NECK:  Left internal carotid artery is occluded.  Post right carotid endarterectomy. No high-grade stenosis although there is mild narrowing at the distal anastomotic site.  Right vertebral artery is dominant. Moderate narrowing of the origin  of the vertebral arteries bilaterally greater on the right.  Please see above for further detail.  These results were called by telephone at the time of interpretation on 01/20/2014 at 7:52 PM to [facility] patients nurse who verbally acknowledged these results.   Electronically Signed   By: Bridgett Larsson M.D.   On: 01/10/2014 19:56   Dg Chest Portable 1 View  01/08/2014   CLINICAL DATA:  Seizure.  Endotracheal tube placement.  EXAM: PORTABLE CHEST - 1 VIEW  COMPARISON:  01/02/2014  FINDINGS: Endotracheal tube has its tip 4 cm above the carina. Nasogastric tube has its tip can a hiatal hernia above the level of the diaphragm. There is interstitial and early alveolar pulmonary edema. No effusion.  IMPRESSION: Acute pulmonary edema.  Endotracheal tube well positioned.  Nasogastric tube tip in a hiatal hernia, above the level of the diaphragm.   Electronically Signed   By: Paulina Fusi M.D.   On: 01/11/2014 14:17    CT of the brain   IMPRESSION:  CT head: Acute moderate right middle cerebral artery territory  infarct, involving the right temporal lobe. No hemorrhagic  conversion.  Remote right basal ganglia/corona radiata and remote right  temporoccipital infarcts, the latter of which appears slightly  larger though this could be technical. Moderate to severe white  matter changes suggest chronic small vessel ischemic disease.  CT cervical spine: No acute cervical spine fracture. Grade 1 C4-5  anterolisthesis on degenerative basis.  Postoperative changes of the right carotid space suggests recent  carotid endarterectomy. In addition, calcific atherosclerosis of the  origin left internal carotid artery, with central hypodensity which  may be seen with occlusion.  MRI of the brain   IMPRESSION:  MRI HEAD :  Occluded left internal carotid artery.  Recent right carotid endarterectomy.  Subacute enhancing large infarcts involving right frontal -parietal  -occipital lobe and posterior right temporal  lobe as well as left  frontal and parietal lobe. Small area of blood breakdown products  right occipital lobe.  Acute infarcts involving mid to anterior right temporal lobe,  operculum region and subinsular region as well as involving the  posterior right thalamus suggestive of acute infarct (infection such  as herpes felt to be less likely consideration given the clinical  setting).  Remote right coronal radiata/centrum semiovale and caudate head  infarct with dilated right lateral ventricle and wallerian  degeneration.  MRA of the brain   Occluded left internal carotid artery. Reconstitution of flow at the  left carotid terminus is not adequate as there is poor flow seen  within the left middle cerebral artery branches and at the level of  the left carotid terminus.  Ectatic right internal carotid artery cavernous and supra clinoid  segment.  Middle cerebral artery branch vessel mild irregularity.  Moderate narrowing left vertebral artery. Mild irregularity right  vertebral artery.  Non visualization right posterior inferior cerebellar artery and  left anterior inferior cerebellar artery.  Mild narrowing and irregularity of the basilar artery which is  ectatic.  Mild to moderate narrowing and right posterior cerebral artery  proximal to terminal branching.   MRI NECK:  Left internal carotid artery is occluded.  Post right carotid endarterectomy. No high-grade stenosis although  there is mild narrowing at the distal anastomotic site.  Right vertebral artery is dominant. Moderate narrowing of the origin  of the vertebral arteries bilaterally greater on the right.   2D Echocardiogram    Carotid Doppler    CXR   IMPRESSION:  1. Marked improvement in interstitial pulmonary edema. Small  residual left effusion.  2. Endotracheal tube tip projects 5.7 cm above the carina.  Orogastric tube tip projects in the distal esophagus. Support  apparatus is felt to be unchanged  allowing for differences in  patient positioning.  EKG   Normal sinus rhythm Possible Left atrial enlargement Nonspecific ST abnormality Abnormal ECG  Therapy Recommendations Pending  Physical Exam  General: The patient is comatose, intubated at the time of examination  Respiratory: Lung fields are clear  Cardiovascular: Regular rate and rhythm, no obvious murmurs or rubs are noted.  Abdomen: Soft, nontender, minimal bowel sounds  Skin: No significant peripheral edema is noted.   Neurologic Exam  Mental status: The patient comatose, intubated. No response to sternal rub is noted.  Cranial nerves: Facial symmetry is present. Intermittent jerking of the head to the left is seen. Eyes are mid position, pupils are equal, trace reactive to light.  Motor: The patient has no gone chair movements of the extremities, some increase in motor tone of the legs is seen bilaterally.  Sensory examination: Slight flexion withdrawal of the lower  extremities with pain stimulation, no gone chair movements seen. No response with pain stimulation of the upper extremities.  Coordination: The patient could not follow commands for cerebellar testing.  Gait and station: The gait could not be tested.  Reflexes: Deep tendon reflexes are symmetric. Toes are neutral bilaterally.    ASSESSMENT Jacqueline Cline is a 79 y.o. female presenting with left focal seizures, comatose state. MRI the brain is suggestive of a right thalamic stroke, large right parietal, temporal, and occipital stroke with acute and subacute components with some involvement in the left posterior regions as well. MRA shows occlusion that is known of the left internal carotid artery, the right internal carotid artery is patent. The patient was on low-dose aspirin prior to admission.   Focal seizures, probable status epilepticus  Right greater left brain strokes by MRI  Known left internal carotid artery occlusion, right  carotid endarterectomy  Hypertension  Dyslipidemia  Hospital day # 1  I'll try to review the MRI of the the radiologist today. I would question whether some of the changes seen on MRI may be representative of a PRES syndrome.  TREATMENT/PLAN  Rectal aspirin has been held  Vimpat for seizures, will add Depacon, discontinue the Keppra  Supportive care, critical care medicine involved  EEG evaluation  Follow clinical course  York Spaniel  01/17/2014 8:13 AM

## 2014-01-18 LAB — CBC
HCT: 37.6 % (ref 36.0–46.0)
Hemoglobin: 12.4 g/dL (ref 12.0–15.0)
MCH: 29.3 pg (ref 26.0–34.0)
MCHC: 33 g/dL (ref 30.0–36.0)
MCV: 88.9 fL (ref 78.0–100.0)
PLATELETS: 169 10*3/uL (ref 150–400)
RBC: 4.23 MIL/uL (ref 3.87–5.11)
RDW: 13.5 % (ref 11.5–15.5)
WBC: 10.2 10*3/uL (ref 4.0–10.5)

## 2014-01-18 LAB — GLUCOSE, CAPILLARY
Glucose-Capillary: 99 mg/dL (ref 70–99)
Glucose-Capillary: 106 mg/dL — ABNORMAL HIGH (ref 70–99)
Glucose-Capillary: 105 mg/dL — ABNORMAL HIGH (ref 70–99)

## 2014-01-18 LAB — PHOSPHORUS: PHOSPHORUS: 1.9 mg/dL — AB (ref 2.3–4.6)

## 2014-01-18 LAB — BASIC METABOLIC PANEL
BUN: 15 mg/dL (ref 6–23)
CALCIUM: 9.2 mg/dL (ref 8.4–10.5)
CO2: 24 mEq/L (ref 19–32)
CREATININE: 0.61 mg/dL (ref 0.50–1.10)
Chloride: 105 mEq/L (ref 96–112)
GFR, EST NON AFRICAN AMERICAN: 86 mL/min — AB (ref >90)
Glucose, Bld: 102 mg/dL — ABNORMAL HIGH (ref 70–99)
Potassium: 3.5 mEq/L — ABNORMAL LOW (ref 3.7–5.3)
SODIUM: 141 meq/L (ref 137–147)

## 2014-01-18 LAB — VALPROIC ACID LEVEL: Valproic Acid Lvl: 73.8 ug/mL (ref 50.0–100.0)

## 2014-01-18 LAB — MAGNESIUM: MAGNESIUM: 1.7 mg/dL (ref 1.5–2.5)

## 2014-01-18 MED ORDER — SODIUM CHLORIDE 0.9 % IV SOLN
200.0000 mg | Freq: Two times a day (BID) | INTRAVENOUS | Status: DC
  Administered 2014-01-18 – 2014-01-26 (×17): 200 mg via INTRAVENOUS
  Filled 2014-01-18 (×36): qty 20

## 2014-01-18 MED ORDER — POTASSIUM CHLORIDE 20 MEQ/15ML (10%) PO LIQD
20.0000 meq | ORAL | Status: AC
  Administered 2014-01-18 (×2): 20 meq
  Filled 2014-01-18 (×2): qty 15

## 2014-01-18 MED ORDER — SODIUM CHLORIDE 0.9 % IV SOLN
1000.0000 mg | Freq: Three times a day (TID) | INTRAVENOUS | Status: DC
  Administered 2014-01-18 – 2014-01-20 (×7): 1000 mg via INTRAVENOUS
  Filled 2014-01-18 (×9): qty 10

## 2014-01-18 MED ORDER — VALPROATE SODIUM 500 MG/5ML IV SOLN
750.0000 mg | Freq: Three times a day (TID) | INTRAVENOUS | Status: DC
  Administered 2014-01-18 – 2014-01-19 (×3): 750 mg via INTRAVENOUS
  Filled 2014-01-18 (×6): qty 7.5

## 2014-01-18 NOTE — Procedures (Signed)
ELECTROENCEPHALOGRAM REPORT   Patient: Jacqueline StablerBarbara F Cline       Room #: [id number] EEG No. ID: [id number] Age: 79 y.o.        Sex: female Referring Physician: Tyson AliasFeinstein Report Date:  01/18/2014        Interpreting Physician: Thana FarrLeslie Sharman Garrott  History: Jacqueline Cline is an 79 y.o. female found down with seizure activity.  Now intubated and sedated  Medications:  Scheduled: . antiseptic oral rinse  15 mL Mouth Rinse QID  . chlorhexidine  15 mL Mouth Rinse BID  . famotidine (PEPCID) IV  20 mg Intravenous Q24H  . feeding supplement (VITAL HIGH PROTEIN)  1,000 mL Per Tube Q24H  . heparin  5,000 Units Subcutaneous 3 times per day  . lacosamide (VIMPAT) IV  200 mg Intravenous Q12H  . levETIRAcetam  1,000 mg Intravenous 3 times per day  . multivitamin  5 mL Oral Daily  . valproic acid (DEPACON) IVPB  750 mg Intravenous 3 times per day    Conditions of Recording:  This is a 16 channel EEG with accompanying video monitoring carried out from 1425 on 01/17/2014 to 0729 on 01/18/2014 with the patient in the intubated and sedated state.  Description:  The background activity remains asymmetric.  Over the left hemisphere the background activity is slow and of low voltage.  Although there is some burst suppression activity seen the burst activity is short lived and of low voltage.   Over the right hemisphere there is some burst activity alternating with periods of attenuation.  Particularly early on in the tracing although there are periods of burst activity that only last from 5-10 seconds, there are many periods where the burst activity lasts up to a minute with 30 second episodes being very frequent.  These bursts consists of high voltage, poorly organized slow activity and include embedded sharp transients.  No clinical activity is noted to correspond with the prolonged burst activity.  Later in the recording these periods are less frequent and less prolonged being most commonly seen to not last over 15 seconds.     Hyperventilation and intermittent photic stimulation were not performed.  IMPRESSION: This is an abnormal EEG.  Although burst-suppression activity is noted there is concern that the prolonged burst activity seen may represent nonclinical seizure activity.  Clinical correlation recommended.     Thana FarrLeslie Mariluz Crespo, MD [facility] [phone] 01/18/2014, 3:58 PM

## 2014-01-18 NOTE — Progress Notes (Signed)
[facility] FOR [facility] LAB REPLACEMENT ONLY  The [facility] does [facility] based on the criteria listed below:   1. Is GFR >/= 40 ml/min? yes  Patient's GFR today is 86 2. Is urine output >/= 0.5 ml/kg/hr for the last 6 hours? yes Patient's UOP is 1.0 ml/kg/hr 3. Is BUN < 60 mg/dL? yes  Patient's BUN today is 15 4. Abnormal electrolyte(s):Potassium 5. Ordered repletion with: Potassium Thomasenia BottomsJean P Kay Ricciuti 01/18/2014 4:52 AM

## 2014-01-18 NOTE — Progress Notes (Signed)
PULMONARY / CRITICAL CARE MEDICINE   Name: Jacqueline Cline MRN: [id number] DOB: 09/17/1935    ADMISSION DATE:  02/03/2014  REFERRING MD :  EDP  PRIMARY SERVICE: PCCM   CHIEF COMPLAINT:  CVA w AMS / Resp Fx  BRIEF PATIENT DESCRIPTION: 79 y/o F with recent R carotid stent placement who presented to [facility] ER on 4/10 with AMS & Seizures.  Found to have R MCA CVA, seizures, and acute respiratory failure.    SIGNIFICANT EVENTS:  4/10 - admit with R MCA CVA, seizures, resp failure  STUDIES: 4/10 - CT of head / neck >> acute moderate R MCA CVA without hemorrhagic conversion, remote R basal gaglia & right temporoccipital infarct, severe white matter changes. Neck without acute fracture.  LINES / TUBES: OETT 4/10>>> changed 4/11 -cuff leak >>  CULTURES:   ANTIBIOTICS:   INTERVAL HX:  Remains on c EEG No acute change overnight.  Afebrile   VITAL SIGNS: Temp:  [98.2 F (36.8 C)-99 F (37.2 C)] 98.9 F (37.2 C) (04/12 0808) Pulse Rate:  [69-89] 74 (04/12 1200) Resp:  [18-45] 18 (04/12 1200) BP: (131-169)/(53-74) 134/53 mmHg (04/12 1200) SpO2:  [99 %-100 %] 100 % (04/12 1200) FiO2 (%):  [30 %] 30 % (04/12 1200) Weight:  [62.3 kg (137 lb 5.6 oz)] 62.3 kg (137 lb 5.6 oz) (04/12 0423)  HEMODYNAMICS:    VENTILATOR SETTINGS: Vent Mode:  [-] PRVC FiO2 (%):  [30 %] 30 % Set Rate:  [18 bmp] 18 bmp Vt Set:  [460 mL] 460 mL PEEP:  [5 cmH20] 5 cmH20 Plateau Pressure:  [14 cmH20-15 cmH20] 14 cmH20  INTAKE / OUTPUT: Intake/Output     04/11 0701 - 04/12 0700 04/12 0701 - 04/13 0700   I.V. (mL/kg) 2018.5 (32.4) 375 (6)   NG/GT 665 175   IV Piggyback 525 100   Total Intake(mL/kg) 3208.5 (51.5) 650 (10.4)   Urine (mL/kg/hr) 1295 (0.9) 425 (1.2)   Total Output 1295 425   Net +1913.5 +225          PHYSICAL EXAMINATION: General:  Elderly female on vent, NAD Neuro: no response on propofol, L pupil 3mm sluggish, R 2mm no response , occasional yawning movements HEENT:   OETT Cardiovascular:  s1s2 rrr Lungs:  resp's even/non-labored on vent, lungs bilaterally coarse Abdomen:  Round/soft, bsx4 active  Musculoskeletal:  No acute deformties Skin:  Warm/dry, no edema  LABS:  CBC  Recent Labs Lab 02/05/2014 1402 01/29/2014 1423 01/17/14 0331 01/18/14 0304  WBC 18.9*  --  12.8* 10.2  HGB 14.0 14.6 13.1 12.4  HCT 42.2 43.0 38.8 37.6  PLT 247  --  178 169   Coag's  Recent Labs Lab 01/20/2014 1402  APTT 22*  INR 0.92   BMET  Recent Labs Lab 01/22/2014 1402 01/14/2014 1423 01/17/14 0331 01/18/14 0304  NA 144 142 141 141  K 4.1 3.9 4.1 3.5*  CL 103 103 104 105  CO2 26  --  21 24  BUN 20 19 16 15   CREATININE 0.95 1.20* 0.61 0.61  GLUCOSE 164* 163* 124* 102*   Electrolytes  Recent Labs Lab 02/02/2014 1402 01/17/14 0331 01/18/14 0304  CALCIUM 10.1 9.2 9.2  MG  --   --  1.7  PHOS  --   --  1.9*   Sepsis Markers No results found for this basename: LATICACIDVEN, PROCALCITON, O2SATVEN,  in the last 168 hours ABG  Recent Labs Lab 01/15/2014 1434  PHART 7.368  PCO2ART 45.8*  PO2ART  220.0*   Liver Enzymes  Recent Labs Lab 01/23/2014 1402  AST 170*  ALT 172*  ALKPHOS 74  BILITOT 0.4  ALBUMIN 3.5   Cardiac Enzymes  Recent Labs Lab 01/27/2014 2215 01/17/14 0341  TROPONINI 1.02* 0.64*   Glucose  Recent Labs Lab 01/15/2014 1351 01/17/14 1931 01/17/14 2326 01/18/14 0334  GLUCAP 175* 104* 99 106*    Imaging Ct Head Wo Contrast  02/02/2014   CLINICAL DATA:  Left-sided seizures, found down.  Left hemi paresis.  EXAM: CT HEAD WITHOUT CONTRAST  CT CERVICAL SPINE WITHOUT CONTRAST  TECHNIQUE: Multidetector CT imaging of the head and cervical spine was performed following the standard protocol without intravenous contrast. Multiplanar CT image reconstructions of the cervical spine were also generated.  COMPARISON:  CT HEAD W/O CM dated 12/30/2013  FINDINGS: CT HEAD FINDINGS  Right temporal loss of the great matter junction, axial 12/32,  with equivocal right hyperdense MCA.  No acute intraparenchymal hemorrhage, mass effect or midline shift. Wedge-like hypodensity in right temporal occipital lobe appears somewhat worse though, this could be due to slice selection. Remote right basal ganglia/ corona radiata infarct with ex vacuo dilatation right frontal horn/lateral ventricle again seen. Subcentimeter apparent calcification along the right subependymal margin appears slightly smaller which may be related to slice positioning. Moderate to severe patchy white matter hypodensities, similar.  No abnormal extra-axial fluid collections. Basal cisterns are patent. Moderate to severe calcific atherosclerosis of the carotid siphons.  CT CERVICAL SPINE FINDINGS  Cervical vertebral bodies are intact, with grade 1 C4-5 anterolisthesis on degenerative basis. Moderate to severe C5-6 and C6-7 degenerative disc disease, moderate at C3-4. C1-2 articulation maintained with severe arthropathy. Mild calcified pannus about the odontoid process likely reflects CPPD. Life-support lines with fluid layering in the nasopharynx. Surgical clips in the right neck suggests carotid endarterectomy, with soft tissue within the right carotid space. Calcific atherosclerosis of the origin of left internal carotid artery with central hypodensity, equivocal for occlusion.  Degenerative disc disease and facet arthropathy with small to moderate C 5 6 and C6-7 apparent disc protrusions. Mild canal stenosis C5-6. Severe right C4-5 neural foraminal narrowing.  IMPRESSION: CT head: Acute moderate right middle cerebral artery territory infarct, involving the right temporal lobe. No hemorrhagic conversion.  Remote right basal ganglia/corona radiata and remote right temporoccipital infarcts, the latter of which appears slightly larger though this could be technical. Moderate to severe white matter changes suggest chronic small vessel ischemic disease.  CT cervical spine: No acute cervical spine  fracture. Grade 1 C4-5 anterolisthesis on degenerative basis.  Postoperative changes of the right carotid space suggests recent carotid endarterectomy. In addition, calcific atherosclerosis of the origin left internal carotid artery, with central hypodensity which may be seen with occlusion.  Critical Value/emergent results were called by telephone at the time of interpretation on 01/08/2014 at 3:10 PM to Dr. Aundra Millet, [facility] , who verbally acknowledged these results.   Electronically Signed   By: Awilda Metro   On: 01/31/2014 15:17   Ct Cervical Spine Wo Contrast  01/21/2014   CLINICAL DATA:  Left-sided seizures, found down.  Left hemi paresis.  EXAM: CT HEAD WITHOUT CONTRAST  CT CERVICAL SPINE WITHOUT CONTRAST  TECHNIQUE: Multidetector CT imaging of the head and cervical spine was performed following the standard protocol without intravenous contrast. Multiplanar CT image reconstructions of the cervical spine were also generated.  COMPARISON:  CT HEAD W/O CM dated 12/30/2013  FINDINGS: CT HEAD FINDINGS  Right temporal loss of the great  matter junction, axial 12/32, with equivocal right hyperdense MCA.  No acute intraparenchymal hemorrhage, mass effect or midline shift. Wedge-like hypodensity in right temporal occipital lobe appears somewhat worse though, this could be due to slice selection. Remote right basal ganglia/ corona radiata infarct with ex vacuo dilatation right frontal horn/lateral ventricle again seen. Subcentimeter apparent calcification along the right subependymal margin appears slightly smaller which may be related to slice positioning. Moderate to severe patchy white matter hypodensities, similar.  No abnormal extra-axial fluid collections. Basal cisterns are patent. Moderate to severe calcific atherosclerosis of the carotid siphons.  CT CERVICAL SPINE FINDINGS  Cervical vertebral bodies are intact, with grade 1 C4-5 anterolisthesis on degenerative basis. Moderate to severe C5-6 and C6-7  degenerative disc disease, moderate at C3-4. C1-2 articulation maintained with severe arthropathy. Mild calcified pannus about the odontoid process likely reflects CPPD. Life-support lines with fluid layering in the nasopharynx. Surgical clips in the right neck suggests carotid endarterectomy, with soft tissue within the right carotid space. Calcific atherosclerosis of the origin of left internal carotid artery with central hypodensity, equivocal for occlusion.  Degenerative disc disease and facet arthropathy with small to moderate C 5 6 and C6-7 apparent disc protrusions. Mild canal stenosis C5-6. Severe right C4-5 neural foraminal narrowing.  IMPRESSION: CT head: Acute moderate right middle cerebral artery territory infarct, involving the right temporal lobe. No hemorrhagic conversion.  Remote right basal ganglia/corona radiata and remote right temporoccipital infarcts, the latter of which appears slightly larger though this could be technical. Moderate to severe white matter changes suggest chronic small vessel ischemic disease.  CT cervical spine: No acute cervical spine fracture. Grade 1 C4-5 anterolisthesis on degenerative basis.  Postoperative changes of the right carotid space suggests recent carotid endarterectomy. In addition, calcific atherosclerosis of the origin left internal carotid artery, with central hypodensity which may be seen with occlusion.  Critical Value/emergent results were called by telephone at the time of interpretation on 01-29-14 at 3:10 PM to Dr. Aundra Millet, [facility] , who verbally acknowledged these results.   Electronically Signed   By: Awilda Metro   On: 29-Jan-2014 15:17   Mr Maxine Glenn Head Wo Contrast  01/17/2014   ADDENDUM REPORT: 01/17/2014 11:32  ADDENDUM: Images reviewed with Dr. Anne Hahn. The patient has had episode of hypertension and seizures. The areas of altered signal intensity and enhancement involving the right frontal -temporal -occipital lobe and the posterior right  temporal lobe as well as the left frontal and parietal lobe may therefore represent result of posterior reversible encephalopathy syndrome/hypertensive encephalopathy (hyper perfusion post carotid endarterectomy). This may have a reversible component. Presently only small areas hemorrhage is noted in the right occipital aspect.  The altered signal intensity within the right mid to anterior temporal lobe, operculum region and sub insular region may represent result of seizure activity (possibility of herpes discussed with Dr. Anne Hahn).  The restricted motion within the posterior right thalamus may represent result of acute infarction.  These findings would be best assessed by serial follow-up MR imaging.   Electronically Signed   By: Bridgett Larsson M.D.   On: 01/17/2014 11:32   01/17/2014   CLINICAL DATA:  Post right carotid endarterectomy 01/06/2014. Found down. Hypertension. Hyperlipidemia.  EXAM: MRI HEAD WITHOUT AND WITH CONTRAST AND MRA HEAD WITHOUT AND WITH CONTRAST AND MRI NECK WITHOUT AND WITH CONTRAST  TECHNIQUE: Multiplanar, multiecho pulse sequences of the brain and surrounding structures were obtained without and with intravenous contrast. Angiographic images of the head were obtained using MRA  technique without and with contrast. Multiplanar, multiecho pulse sequences of the neck and surrounding structures were obtained without and with intravenous contrast.  CONTRAST:  10 cc MultiHance.  COMPARISON:  01/15/2014 and 12/30/2013 CT.  No comparison MR.  FINDINGS: MRI HEAD FINDINGS  Occluded left internal carotid artery.  Recent right carotid endarterectomy.  Large area of altered signal intensity involving the right frontal -parietal -occipital lobe and posterior right temporal lobe as well as left frontal and parietal lobe which demonstrates patchy enhancement. Small area of blood breakdown products right occipital lobe. Appearance is suggestive of the presence of subacute infarcts.  Altered signal intensity  mid to anterior right temporal lobe, operculum region and subinsular region as well as involving the posterior right thalamus suggestive of acute infarct (infection such as herpes felt to be less likely consideration given the clinical setting).  Remote right coronal radiata/centrum semiovale and caudate head infarct with dilated right lateral ventricle and wallerian degeneration.  Atrophy without hydrocephalus.  No intracranial mass lesion seen separate from the above described findings.  Pooled secretions posterior superior nasopharynx.  Mild paranasal sinus mucosal thickening.  Cervical medullary junction, pituitary region, pineal region and orbital structures unremarkable.  MRA HEAD FINDINGS  Occluded left internal carotid artery. Reconstitution of flow at the left carotid terminus is not adequate as there is poor flow seen within the left middle cerebral artery branches and at the level of the left carotid terminus.  Ectatic right internal carotid artery cavernous and supra clinoid segment. No saccular aneurysm separate from the atherosclerotic type changes.  Middle cerebral artery branch vessel mild irregularity.  Moderate narrowing left vertebral artery. Mild irregularity right vertebral artery.  Non visualization right posterior inferior cerebellar artery and left anterior inferior cerebellar artery.  Mild narrowing and irregularity of the basilar artery which is ectatic.  Fetal type contribution to the right posterior cerebral artery. Mild to moderate narrowing and right posterior cerebral artery proximal to terminal branching.  MRI NECK FINDINGS  Three vessel aortic arch.  Left internal carotid artery is occluded.  Post right carotid endarterectomy remains patent. No high-grade stenosis although there is mild narrowing at the distal anastomotic site.  Right vertebral artery is dominant. Moderate narrowing of the origin of the vertebral arteries bilaterally greater on the right.  No high-grade stenosis of  either subclavian artery.  IMPRESSION: MRI HEAD :  Occluded left internal carotid artery.  Recent right carotid endarterectomy.  Subacute enhancing large infarcts involving right frontal -parietal -occipital lobe and posterior right temporal lobe as well as left frontal and parietal lobe. Small area of blood breakdown products right occipital lobe.  Acute infarcts involving mid to anterior right temporal lobe, operculum region and subinsular region as well as involving the posterior right thalamus suggestive of acute infarct (infection such as herpes felt to be less likely consideration given the clinical setting).  Remote right coronal radiata/centrum semiovale and caudate head infarct with dilated right lateral ventricle and wallerian degeneration.  MRA HEAD:  Occluded left internal carotid artery. Reconstitution of flow at the left carotid terminus is not adequate as there is poor flow seen within the left middle cerebral artery branches and at the level of the left carotid terminus.  Ectatic right internal carotid artery cavernous and supra clinoid segment.  Middle cerebral artery branch vessel mild irregularity.  Moderate narrowing left vertebral artery. Mild irregularity right vertebral artery.  Non visualization right posterior inferior cerebellar artery and left anterior inferior cerebellar artery.  Mild narrowing and irregularity of  the basilar artery which is ectatic.  Mild to moderate narrowing and right posterior cerebral artery proximal to terminal branching.  MRI NECK:  Left internal carotid artery is occluded.  Post right carotid endarterectomy. No high-grade stenosis although there is mild narrowing at the distal anastomotic site.  Right vertebral artery is dominant. Moderate narrowing of the origin of the vertebral arteries bilaterally greater on the right.  Please see above for further detail.  These results were called by telephone at the time of interpretation on 01-23-2014 at 7:52 PM to [facility]  patients nurse who verbally acknowledged these results.  Electronically Signed: By: Bridgett Larsson M.D. On: 01/23/2014 19:56   Mr Angiogram Neck W Wo Contrast  01/17/2014   ADDENDUM REPORT: 01/17/2014 11:32  ADDENDUM: Images reviewed with Dr. Anne Hahn. The patient has had episode of hypertension and seizures. The areas of altered signal intensity and enhancement involving the right frontal -temporal -occipital lobe and the posterior right temporal lobe as well as the left frontal and parietal lobe may therefore represent result of posterior reversible encephalopathy syndrome/hypertensive encephalopathy (hyper perfusion post carotid endarterectomy). This may have a reversible component. Presently only small areas hemorrhage is noted in the right occipital aspect.  The altered signal intensity within the right mid to anterior temporal lobe, operculum region and sub insular region may represent result of seizure activity (possibility of herpes discussed with Dr. Anne Hahn).  The restricted motion within the posterior right thalamus may represent result of acute infarction.  These findings would be best assessed by serial follow-up MR imaging.   Electronically Signed   By: Bridgett Larsson M.D.   On: 01/17/2014 11:32   01/17/2014   CLINICAL DATA:  Post right carotid endarterectomy 01/06/2014. Found down. Hypertension. Hyperlipidemia.  EXAM: MRI HEAD WITHOUT AND WITH CONTRAST AND MRA HEAD WITHOUT AND WITH CONTRAST AND MRI NECK WITHOUT AND WITH CONTRAST  TECHNIQUE: Multiplanar, multiecho pulse sequences of the brain and surrounding structures were obtained without and with intravenous contrast. Angiographic images of the head were obtained using MRA technique without and with contrast. Multiplanar, multiecho pulse sequences of the neck and surrounding structures were obtained without and with intravenous contrast.  CONTRAST:  10 cc MultiHance.  COMPARISON:  01-23-14 and 12/30/2013 CT.  No comparison MR.  FINDINGS: MRI HEAD  FINDINGS  Occluded left internal carotid artery.  Recent right carotid endarterectomy.  Large area of altered signal intensity involving the right frontal -parietal -occipital lobe and posterior right temporal lobe as well as left frontal and parietal lobe which demonstrates patchy enhancement. Small area of blood breakdown products right occipital lobe. Appearance is suggestive of the presence of subacute infarcts.  Altered signal intensity mid to anterior right temporal lobe, operculum region and subinsular region as well as involving the posterior right thalamus suggestive of acute infarct (infection such as herpes felt to be less likely consideration given the clinical setting).  Remote right coronal radiata/centrum semiovale and caudate head infarct with dilated right lateral ventricle and wallerian degeneration.  Atrophy without hydrocephalus.  No intracranial mass lesion seen separate from the above described findings.  Pooled secretions posterior superior nasopharynx.  Mild paranasal sinus mucosal thickening.  Cervical medullary junction, pituitary region, pineal region and orbital structures unremarkable.  MRA HEAD FINDINGS  Occluded left internal carotid artery. Reconstitution of flow at the left carotid terminus is not adequate as there is poor flow seen within the left middle cerebral artery branches and at the level of the left carotid terminus.  Ectatic right  internal carotid artery cavernous and supra clinoid segment. No saccular aneurysm separate from the atherosclerotic type changes.  Middle cerebral artery branch vessel mild irregularity.  Moderate narrowing left vertebral artery. Mild irregularity right vertebral artery.  Non visualization right posterior inferior cerebellar artery and left anterior inferior cerebellar artery.  Mild narrowing and irregularity of the basilar artery which is ectatic.  Fetal type contribution to the right posterior cerebral artery. Mild to moderate narrowing and  right posterior cerebral artery proximal to terminal branching.  MRI NECK FINDINGS  Three vessel aortic arch.  Left internal carotid artery is occluded.  Post right carotid endarterectomy remains patent. No high-grade stenosis although there is mild narrowing at the distal anastomotic site.  Right vertebral artery is dominant. Moderate narrowing of the origin of the vertebral arteries bilaterally greater on the right.  No high-grade stenosis of either subclavian artery.  IMPRESSION: MRI HEAD :  Occluded left internal carotid artery.  Recent right carotid endarterectomy.  Subacute enhancing large infarcts involving right frontal -parietal -occipital lobe and posterior right temporal lobe as well as left frontal and parietal lobe. Small area of blood breakdown products right occipital lobe.  Acute infarcts involving mid to anterior right temporal lobe, operculum region and subinsular region as well as involving the posterior right thalamus suggestive of acute infarct (infection such as herpes felt to be less likely consideration given the clinical setting).  Remote right coronal radiata/centrum semiovale and caudate head infarct with dilated right lateral ventricle and wallerian degeneration.  MRA HEAD:  Occluded left internal carotid artery. Reconstitution of flow at the left carotid terminus is not adequate as there is poor flow seen within the left middle cerebral artery branches and at the level of the left carotid terminus.  Ectatic right internal carotid artery cavernous and supra clinoid segment.  Middle cerebral artery branch vessel mild irregularity.  Moderate narrowing left vertebral artery. Mild irregularity right vertebral artery.  Non visualization right posterior inferior cerebellar artery and left anterior inferior cerebellar artery.  Mild narrowing and irregularity of the basilar artery which is ectatic.  Mild to moderate narrowing and right posterior cerebral artery proximal to terminal branching.   MRI NECK:  Left internal carotid artery is occluded.  Post right carotid endarterectomy. No high-grade stenosis although there is mild narrowing at the distal anastomotic site.  Right vertebral artery is dominant. Moderate narrowing of the origin of the vertebral arteries bilaterally greater on the right.  Please see above for further detail.  These results were called by telephone at the time of interpretation on 01/30/2014 at 7:52 PM to [facility] patients nurse who verbally acknowledged these results.  Electronically Signed: By: Bridgett Larsson M.D. On: 01/15/2014 19:56   Mr Laqueta Jean RU Contrast  01/17/2014   ADDENDUM REPORT: 01/17/2014 11:32  ADDENDUM: Images reviewed with Dr. Anne Hahn. The patient has had episode of hypertension and seizures. The areas of altered signal intensity and enhancement involving the right frontal -temporal -occipital lobe and the posterior right temporal lobe as well as the left frontal and parietal lobe may therefore represent result of posterior reversible encephalopathy syndrome/hypertensive encephalopathy (hyper perfusion post carotid endarterectomy). This may have a reversible component. Presently only small areas hemorrhage is noted in the right occipital aspect.  The altered signal intensity within the right mid to anterior temporal lobe, operculum region and sub insular region may represent result of seizure activity (possibility of herpes discussed with Dr. Anne Hahn).  The restricted motion within the posterior right thalamus may represent  result of acute infarction.  These findings would be best assessed by serial follow-up MR imaging.   Electronically Signed   By: Bridgett Larsson M.D.   On: 01/17/2014 11:32   01/17/2014   CLINICAL DATA:  Post right carotid endarterectomy 01/06/2014. Found down. Hypertension. Hyperlipidemia.  EXAM: MRI HEAD WITHOUT AND WITH CONTRAST AND MRA HEAD WITHOUT AND WITH CONTRAST AND MRI NECK WITHOUT AND WITH CONTRAST  TECHNIQUE: Multiplanar, multiecho pulse  sequences of the brain and surrounding structures were obtained without and with intravenous contrast. Angiographic images of the head were obtained using MRA technique without and with contrast. Multiplanar, multiecho pulse sequences of the neck and surrounding structures were obtained without and with intravenous contrast.  CONTRAST:  10 cc MultiHance.  COMPARISON:  01-24-2014 and 12/30/2013 CT.  No comparison MR.  FINDINGS: MRI HEAD FINDINGS  Occluded left internal carotid artery.  Recent right carotid endarterectomy.  Large area of altered signal intensity involving the right frontal -parietal -occipital lobe and posterior right temporal lobe as well as left frontal and parietal lobe which demonstrates patchy enhancement. Small area of blood breakdown products right occipital lobe. Appearance is suggestive of the presence of subacute infarcts.  Altered signal intensity mid to anterior right temporal lobe, operculum region and subinsular region as well as involving the posterior right thalamus suggestive of acute infarct (infection such as herpes felt to be less likely consideration given the clinical setting).  Remote right coronal radiata/centrum semiovale and caudate head infarct with dilated right lateral ventricle and wallerian degeneration.  Atrophy without hydrocephalus.  No intracranial mass lesion seen separate from the above described findings.  Pooled secretions posterior superior nasopharynx.  Mild paranasal sinus mucosal thickening.  Cervical medullary junction, pituitary region, pineal region and orbital structures unremarkable.  MRA HEAD FINDINGS  Occluded left internal carotid artery. Reconstitution of flow at the left carotid terminus is not adequate as there is poor flow seen within the left middle cerebral artery branches and at the level of the left carotid terminus.  Ectatic right internal carotid artery cavernous and supra clinoid segment. No saccular aneurysm separate from the  atherosclerotic type changes.  Middle cerebral artery branch vessel mild irregularity.  Moderate narrowing left vertebral artery. Mild irregularity right vertebral artery.  Non visualization right posterior inferior cerebellar artery and left anterior inferior cerebellar artery.  Mild narrowing and irregularity of the basilar artery which is ectatic.  Fetal type contribution to the right posterior cerebral artery. Mild to moderate narrowing and right posterior cerebral artery proximal to terminal branching.  MRI NECK FINDINGS  Three vessel aortic arch.  Left internal carotid artery is occluded.  Post right carotid endarterectomy remains patent. No high-grade stenosis although there is mild narrowing at the distal anastomotic site.  Right vertebral artery is dominant. Moderate narrowing of the origin of the vertebral arteries bilaterally greater on the right.  No high-grade stenosis of either subclavian artery.  IMPRESSION: MRI HEAD :  Occluded left internal carotid artery.  Recent right carotid endarterectomy.  Subacute enhancing large infarcts involving right frontal -parietal -occipital lobe and posterior right temporal lobe as well as left frontal and parietal lobe. Small area of blood breakdown products right occipital lobe.  Acute infarcts involving mid to anterior right temporal lobe, operculum region and subinsular region as well as involving the posterior right thalamus suggestive of acute infarct (infection such as herpes felt to be less likely consideration given the clinical setting).  Remote right coronal radiata/centrum semiovale and caudate head infarct with  dilated right lateral ventricle and wallerian degeneration.  MRA HEAD:  Occluded left internal carotid artery. Reconstitution of flow at the left carotid terminus is not adequate as there is poor flow seen within the left middle cerebral artery branches and at the level of the left carotid terminus.  Ectatic right internal carotid artery cavernous  and supra clinoid segment.  Middle cerebral artery branch vessel mild irregularity.  Moderate narrowing left vertebral artery. Mild irregularity right vertebral artery.  Non visualization right posterior inferior cerebellar artery and left anterior inferior cerebellar artery.  Mild narrowing and irregularity of the basilar artery which is ectatic.  Mild to moderate narrowing and right posterior cerebral artery proximal to terminal branching.  MRI NECK:  Left internal carotid artery is occluded.  Post right carotid endarterectomy. No high-grade stenosis although there is mild narrowing at the distal anastomotic site.  Right vertebral artery is dominant. Moderate narrowing of the origin of the vertebral arteries bilaterally greater on the right.  Please see above for further detail.  These results were called by telephone at the time of interpretation on 01/12/2014 at 7:52 PM to [facility] patients nurse who verbally acknowledged these results.  Electronically Signed: By: Bridgett Larsson M.D. On: 01/13/2014 19:56   Dg Chest Port 1 View  01/17/2014   CLINICAL DATA:  ETT placement.  EXAM: PORTABLE CHEST - 1 VIEW  COMPARISON:  DG CHEST 1V PORT dated 01/17/2014  FINDINGS: Endotracheal tube is appreciated tip 4.5 cm above the carina. NGT is appreciated tip none of the of this study. Cardiac silhouette within the upper limits normal. Increased density is appreciated within the left lung base and blunting of the left costophrenic angle. Degenerative changes within the shoulders. No acute osseous abnormalities.  IMPRESSION: Atelectasis versus infiltrate left lung base and stable small left effusion.  Endotracheal tube unchanged. There has been advancement of the patient's NG tube.   Electronically Signed   By: Salome Holmes M.D.   On: 01/17/2014 16:26   Dg Chest Port 1 View  01/17/2014   CLINICAL DATA:  Evaluate lungs and endotracheal tube.  EXAM: PORTABLE CHEST - 1 VIEW  COMPARISON:  Jan 26, 2014  FINDINGS: Endotracheal tube tip  now projects 5.7 cm above the carina, which is higher than on the prior study although this difference may be due to differences in patient positioning/neck position.  Orogastric tube has its tip in the distal esophagus. It does not extend below the diaphragm.  There has been significant improvement in lung aeration. Interstitial thickening has significantly decreased. There is mild hazy opacity at the left lung base likely a small effusion. No areas of lung consolidation.  IMPRESSION: 1. Marked improvement in interstitial pulmonary edema. Small residual left effusion. 2. Endotracheal tube tip projects 5.7 cm above the carina. Orogastric tube tip projects in the distal esophagus. Support apparatus is felt to be unchanged allowing for differences in patient positioning.   Electronically Signed   By: Amie Portland M.D.   On: 01/17/2014 08:18   Dg Chest Portable 1 View  01/15/2014   CLINICAL DATA:  Seizure.  Endotracheal tube placement.  EXAM: PORTABLE CHEST - 1 VIEW  COMPARISON:  01/02/2014  FINDINGS: Endotracheal tube has its tip 4 cm above the carina. Nasogastric tube has its tip can a hiatal hernia above the level of the diaphragm. There is interstitial and early alveolar pulmonary edema. No effusion.  IMPRESSION: Acute pulmonary edema.  Endotracheal tube well positioned.  Nasogastric tube tip in a hiatal hernia, above the  level of the diaphragm.   Electronically Signed   By: Paulina FusiMark  Shogry M.D.   On: 02-24-14 14:17    ASSESSMENT / PLAN:  NEUROLOGIC A:   R thalamic CVA, likely  stent related vasospasm Status epilepticus -temporal focus Acute Encephalopathy  ?PRES P:   -Keppra, vimpat IV, further dosing per pharmacy  -Neurology following -c EEG  -ASA -propofol for sedation, PRN fentanyl for pain  -serial neuro checks   PULMONARY A: Acute Respiratory Failure - in setting of CVA R/O Aspiration  P:   -cont full vent support -trend CXR -PRN albuterol   CARDIOVASCULAR A:  Mild Elevation  Troponin - trending down  CAD HTN Emergency Carotid Occlusion (L) Carotid Stent (R, on 3/31) P:  -ASA -DNR in event arrest  -trend troponin  -BP control - currently managed on propofol   RENAL A:   Acute Kidney Injury - mild elevation in sr cr P:   -gentle hydration -no free water -allow na to rise Po KCL  GASTROINTESTINAL A:   NPO P:   -ct TFs   HEMATOLOGIC A:   No acute process  P:  -monitor CBC -heparin for DVT prophylaxis   INFECTIOUS A:   Concern for Aspiration Injury - mild bilateral interstitial changes  P:   -monitor fever curve / leukocytosis -hold abx for now  ENDOCRINE A:   No acute issues   P:   -monitor BMP glucose trends  GLOBAL: -extensive discussion with family at bedside regarding current clinical status.  Family wishes for DNR status in the event of arrest.  Will continue current therapies. Guarded prognosis given - if not progressing , she would have wanted comfort care (no permanent tubes etc)   I have personally obtained a history, examined the patient, evaluated laboratory and imaging results, formulated the assessment and plan and placed orders.  CRITICAL CARE: The patient is critically ill with multiple organ systems failure and requires high complexity decision making for assessment and support, frequent evaluation and titration of therapies, application of advanced monitoring technologies and extensive interpretation of multiple databases. Critical Care Time devoted to patient care services described in this note is 31 minutes.   Cyril Mourningakesh Alva MD. Tonny BollmanFCCP. [facility] Pager [phone] If no response call [phone]

## 2014-01-18 NOTE — Progress Notes (Signed)
Stroke Team Progress Note  HISTORY   Jacqueline Cline is an 79 y.o. female with known left complete carotid occlusion who recently underwent a right carotid stent on 01/06/14. Per son she ws doing very well, BP controled and lived alone. Her son had called patient at 8 PM last night and she held a normal conversation. He came to pick her up for a Dr. Appointment today at 12:30 and found her face down on the floor with left arm and leg jerking. He states she was awake and able to speak to him. She made mention she had fallen at 8 am this morning. On arrival patient had witnessed left sided seizure, BP 200/100 and was given 2.5 mg Versed. After versed administered she was noted to have airway compromise and intubated. Per note patient then demonstrated right sided seizure activity and was given 2 mg Ativan. Currently she is intubated and sedated on Propofol. She follows no commands and shows no sign of clinical seizure.  Date last known well: Date: 01/15/2014  Time last known well: Time: 20:00  tPA Given: No: out of window   SUBJECTIVE The patient is intubated, sedated. Intermittent jerking of the head to the left is noted. Family is not at bedside.  OBJECTIVE Most recent Vital Signs: Filed Vitals:   01/18/14 0500 01/18/14 0600 01/18/14 0700 01/18/14 0808  BP: 163/72 162/68 140/60 158/67  Pulse: 83 80 69 75  Temp:      TempSrc:      Resp: 28 18 18 21   Height:      Weight:      SpO2: 100% 100% 100% 100%   CBG (last 3)   Recent Labs  01/17/14 1931 01/17/14 2326 01/18/14 0334  GLUCAP 104* 99 106*    IV Fluid Intake:   . sodium chloride 75 mL/hr at 01/17/14 2059  . midazolam (VERSED) infusion Stopped (02/03/2014 1534)  . propofol 35.04 mcg/kg/min (01/18/14 0700)    MEDICATIONS  . antiseptic oral rinse  15 mL Mouth Rinse QID  . chlorhexidine  15 mL Mouth Rinse BID  . famotidine (PEPCID) IV  20 mg Intravenous Q24H  . feeding supplement (VITAL HIGH PROTEIN)  1,000 mL Per Tube Q24H   . heparin  5,000 Units Subcutaneous 3 times per day  . lacosamide (VIMPAT) IV  150 mg Intravenous Q12H  . multivitamin  5 mL Oral Daily  . potassium chloride  20 mEq Per Tube Q4H  . valproic acid (DEPACON) IVPB  500 mg Intravenous 3 times per day   PRN:  sodium chloride, albuterol, etomidate, fentaNYL, rocuronium  Diet:  NPO  Activity:  Bedrest DVT Prophylaxis:  SQ heparin  CLINICALLY SIGNIFICANT STUDIES Basic Metabolic Panel:   Recent Labs Lab 01/17/14 0331 01/18/14 0304  NA 141 141  K 4.1 3.5*  CL 104 105  CO2 21 24  GLUCOSE 124* 102*  BUN 16 15  CREATININE 0.61 0.61  CALCIUM 9.2 9.2  MG  --  1.7  PHOS  --  1.9*   Liver Function Tests:   Recent Labs Lab 02/02/2014 1402  AST 170*  ALT 172*  ALKPHOS 74  BILITOT 0.4  PROT 7.1  ALBUMIN 3.5   CBC:  Recent Labs Lab 01/27/2014 1402  01/17/14 0331 01/18/14 0304  WBC 18.9*  --  12.8* 10.2  NEUTROABS 16.7*  --   --   --   HGB 14.0  < > 13.1 12.4  HCT 42.2  < > 38.8 37.6  MCV 91.3  --  89.0 88.9  PLT 247  --  178 169  < > = values in this interval not displayed. Coagulation:   Recent Labs Lab 02/05/2014 1402  LABPROT 12.2  INR 0.92   Cardiac Enzymes:   Recent Labs Lab 01/24/2014 1402 01/15/2014 2215 01/17/14 0341  CKTOTAL 242*  --   --   TROPONINI  --  1.02* 0.64*   Urinalysis:   Recent Labs Lab 01/12/2014 1405  COLORURINE YELLOW  LABSPEC 1.022  PHURINE 5.5  GLUCOSEU NEGATIVE  HGBUR NEGATIVE  BILIRUBINUR NEGATIVE  KETONESUR NEGATIVE  PROTEINUR 30*  UROBILINOGEN 0.2  NITRITE NEGATIVE  LEUKOCYTESUR NEGATIVE   Lipid Panel    Component Value Date/Time   CHOL 263* 03/10/2013 0916   TRIG 95 01/08/2014 2215   HDL 48.50 03/10/2013 0916   CHOLHDL 5 03/10/2013 0916   VLDL 25.2 03/10/2013 0916   LDLCALC 106* 07/21/2010 0908   HgbA1C  No results found for this basename: HGBA1C    Urine Drug Screen:     Component Value Date/Time   LABOPIA NONE DETECTED 01/27/2014 1405   COCAINSCRNUR NONE DETECTED  02/04/2014 1405   LABBENZ POSITIVE* 02/05/2014 1405   AMPHETMU NONE DETECTED 01/24/2014 1405   THCU NONE DETECTED 01/22/2014 1405   LABBARB NONE DETECTED 02/05/2014 1405    Alcohol Level:   Recent Labs Lab 01/29/2014 1402  ETH <11    Ct Head Wo Contrast  01/29/2014   CLINICAL DATA:  Left-sided seizures, found down.  Left hemi paresis.  EXAM: CT HEAD WITHOUT CONTRAST  CT CERVICAL SPINE WITHOUT CONTRAST  TECHNIQUE: Multidetector CT imaging of the head and cervical spine was performed following the standard protocol without intravenous contrast. Multiplanar CT image reconstructions of the cervical spine were also generated.  COMPARISON:  CT HEAD W/O CM dated 12/30/2013  FINDINGS: CT HEAD FINDINGS  Right temporal loss of the great matter junction, axial 12/32, with equivocal right hyperdense MCA.  No acute intraparenchymal hemorrhage, mass effect or midline shift. Wedge-like hypodensity in right temporal occipital lobe appears somewhat worse though, this could be due to slice selection. Remote right basal ganglia/ corona radiata infarct with ex vacuo dilatation right frontal horn/lateral ventricle again seen. Subcentimeter apparent calcification along the right subependymal margin appears slightly smaller which may be related to slice positioning. Moderate to severe patchy white matter hypodensities, similar.  No abnormal extra-axial fluid collections. Basal cisterns are patent. Moderate to severe calcific atherosclerosis of the carotid siphons.  CT CERVICAL SPINE FINDINGS  Cervical vertebral bodies are intact, with grade 1 C4-5 anterolisthesis on degenerative basis. Moderate to severe C5-6 and C6-7 degenerative disc disease, moderate at C3-4. C1-2 articulation maintained with severe arthropathy. Mild calcified pannus about the odontoid process likely reflects CPPD. Life-support lines with fluid layering in the nasopharynx. Surgical clips in the right neck suggests carotid endarterectomy, with soft tissue within  the right carotid space. Calcific atherosclerosis of the origin of left internal carotid artery with central hypodensity, equivocal for occlusion.  Degenerative disc disease and facet arthropathy with small to moderate C 5 6 and C6-7 apparent disc protrusions. Mild canal stenosis C5-6. Severe right C4-5 neural foraminal narrowing.  IMPRESSION: CT head: Acute moderate right middle cerebral artery territory infarct, involving the right temporal lobe. No hemorrhagic conversion.  Remote right basal ganglia/corona radiata and remote right temporoccipital infarcts, the latter of which appears slightly larger though this could be technical. Moderate to severe white matter changes suggest chronic small vessel ischemic disease.  CT cervical spine: No acute cervical  spine fracture. Grade 1 C4-5 anterolisthesis on degenerative basis.  Postoperative changes of the right carotid space suggests recent carotid endarterectomy. In addition, calcific atherosclerosis of the origin left internal carotid artery, with central hypodensity which may be seen with occlusion.  Critical Value/emergent results were called by telephone at the time of interpretation on 02/04/2014 at 3:10 PM to Dr. Aundra Millet, [facility] , who verbally acknowledged these results.   Electronically Signed   By: Awilda Metro   On: 01/17/2014 15:17   Ct Cervical Spine Wo Contrast  01/23/2014   CLINICAL DATA:  Left-sided seizures, found down.  Left hemi paresis.  EXAM: CT HEAD WITHOUT CONTRAST  CT CERVICAL SPINE WITHOUT CONTRAST  TECHNIQUE: Multidetector CT imaging of the head and cervical spine was performed following the standard protocol without intravenous contrast. Multiplanar CT image reconstructions of the cervical spine were also generated.  COMPARISON:  CT HEAD W/O CM dated 12/30/2013  FINDINGS: CT HEAD FINDINGS  Right temporal loss of the great matter junction, axial 12/32, with equivocal right hyperdense MCA.  No acute intraparenchymal hemorrhage, mass effect  or midline shift. Wedge-like hypodensity in right temporal occipital lobe appears somewhat worse though, this could be due to slice selection. Remote right basal ganglia/ corona radiata infarct with ex vacuo dilatation right frontal horn/lateral ventricle again seen. Subcentimeter apparent calcification along the right subependymal margin appears slightly smaller which may be related to slice positioning. Moderate to severe patchy white matter hypodensities, similar.  No abnormal extra-axial fluid collections. Basal cisterns are patent. Moderate to severe calcific atherosclerosis of the carotid siphons.  CT CERVICAL SPINE FINDINGS  Cervical vertebral bodies are intact, with grade 1 C4-5 anterolisthesis on degenerative basis. Moderate to severe C5-6 and C6-7 degenerative disc disease, moderate at C3-4. C1-2 articulation maintained with severe arthropathy. Mild calcified pannus about the odontoid process likely reflects CPPD. Life-support lines with fluid layering in the nasopharynx. Surgical clips in the right neck suggests carotid endarterectomy, with soft tissue within the right carotid space. Calcific atherosclerosis of the origin of left internal carotid artery with central hypodensity, equivocal for occlusion.  Degenerative disc disease and facet arthropathy with small to moderate C 5 6 and C6-7 apparent disc protrusions. Mild canal stenosis C5-6. Severe right C4-5 neural foraminal narrowing.  IMPRESSION: CT head: Acute moderate right middle cerebral artery territory infarct, involving the right temporal lobe. No hemorrhagic conversion.  Remote right basal ganglia/corona radiata and remote right temporoccipital infarcts, the latter of which appears slightly larger though this could be technical. Moderate to severe white matter changes suggest chronic small vessel ischemic disease.  CT cervical spine: No acute cervical spine fracture. Grade 1 C4-5 anterolisthesis on degenerative basis.  Postoperative changes of  the right carotid space suggests recent carotid endarterectomy. In addition, calcific atherosclerosis of the origin left internal carotid artery, with central hypodensity which may be seen with occlusion.  Critical Value/emergent results were called by telephone at the time of interpretation on 01/15/2014 at 3:10 PM to Dr. Aundra Millet, [facility] , who verbally acknowledged these results.   Electronically Signed   By: Awilda Metro   On: 01/08/2014 15:17   Mr Maxine Glenn Head Wo Contrast  01/17/2014   ADDENDUM REPORT: 01/17/2014 11:32  ADDENDUM: Images reviewed with Dr. Anne Hahn. The patient has had episode of hypertension and seizures. The areas of altered signal intensity and enhancement involving the right frontal -temporal -occipital lobe and the posterior right temporal lobe as well as the left frontal and parietal lobe may therefore represent result of  posterior reversible encephalopathy syndrome/hypertensive encephalopathy (hyper perfusion post carotid endarterectomy). This may have a reversible component. Presently only small areas hemorrhage is noted in the right occipital aspect.  The altered signal intensity within the right mid to anterior temporal lobe, operculum region and sub insular region may represent result of seizure activity (possibility of herpes discussed with Dr. Anne Hahn).  The restricted motion within the posterior right thalamus may represent result of acute infarction.  These findings would be best assessed by serial follow-up MR imaging.   Electronically Signed   By: Bridgett Larsson M.D.   On: 01/17/2014 11:32   01/17/2014   CLINICAL DATA:  Post right carotid endarterectomy 01/06/2014. Found down. Hypertension. Hyperlipidemia.  EXAM: MRI HEAD WITHOUT AND WITH CONTRAST AND MRA HEAD WITHOUT AND WITH CONTRAST AND MRI NECK WITHOUT AND WITH CONTRAST  TECHNIQUE: Multiplanar, multiecho pulse sequences of the brain and surrounding structures were obtained without and with intravenous contrast. Angiographic  images of the head were obtained using MRA technique without and with contrast. Multiplanar, multiecho pulse sequences of the neck and surrounding structures were obtained without and with intravenous contrast.  CONTRAST:  10 cc MultiHance.  COMPARISON:  02-14-14 and 12/30/2013 CT.  No comparison MR.  FINDINGS: MRI HEAD FINDINGS  Occluded left internal carotid artery.  Recent right carotid endarterectomy.  Large area of altered signal intensity involving the right frontal -parietal -occipital lobe and posterior right temporal lobe as well as left frontal and parietal lobe which demonstrates patchy enhancement. Small area of blood breakdown products right occipital lobe. Appearance is suggestive of the presence of subacute infarcts.  Altered signal intensity mid to anterior right temporal lobe, operculum region and subinsular region as well as involving the posterior right thalamus suggestive of acute infarct (infection such as herpes felt to be less likely consideration given the clinical setting).  Remote right coronal radiata/centrum semiovale and caudate head infarct with dilated right lateral ventricle and wallerian degeneration.  Atrophy without hydrocephalus.  No intracranial mass lesion seen separate from the above described findings.  Pooled secretions posterior superior nasopharynx.  Mild paranasal sinus mucosal thickening.  Cervical medullary junction, pituitary region, pineal region and orbital structures unremarkable.  MRA HEAD FINDINGS  Occluded left internal carotid artery. Reconstitution of flow at the left carotid terminus is not adequate as there is poor flow seen within the left middle cerebral artery branches and at the level of the left carotid terminus.  Ectatic right internal carotid artery cavernous and supra clinoid segment. No saccular aneurysm separate from the atherosclerotic type changes.  Middle cerebral artery branch vessel mild irregularity.  Moderate narrowing left vertebral artery.  Mild irregularity right vertebral artery.  Non visualization right posterior inferior cerebellar artery and left anterior inferior cerebellar artery.  Mild narrowing and irregularity of the basilar artery which is ectatic.  Fetal type contribution to the right posterior cerebral artery. Mild to moderate narrowing and right posterior cerebral artery proximal to terminal branching.  MRI NECK FINDINGS  Three vessel aortic arch.  Left internal carotid artery is occluded.  Post right carotid endarterectomy remains patent. No high-grade stenosis although there is mild narrowing at the distal anastomotic site.  Right vertebral artery is dominant. Moderate narrowing of the origin of the vertebral arteries bilaterally greater on the right.  No high-grade stenosis of either subclavian artery.  IMPRESSION: MRI HEAD :  Occluded left internal carotid artery.  Recent right carotid endarterectomy.  Subacute enhancing large infarcts involving right frontal -parietal -occipital lobe and posterior right  temporal lobe as well as left frontal and parietal lobe. Small area of blood breakdown products right occipital lobe.  Acute infarcts involving mid to anterior right temporal lobe, operculum region and subinsular region as well as involving the posterior right thalamus suggestive of acute infarct (infection such as herpes felt to be less likely consideration given the clinical setting).  Remote right coronal radiata/centrum semiovale and caudate head infarct with dilated right lateral ventricle and wallerian degeneration.  MRA HEAD:  Occluded left internal carotid artery. Reconstitution of flow at the left carotid terminus is not adequate as there is poor flow seen within the left middle cerebral artery branches and at the level of the left carotid terminus.  Ectatic right internal carotid artery cavernous and supra clinoid segment.  Middle cerebral artery branch vessel mild irregularity.  Moderate narrowing left vertebral artery.  Mild irregularity right vertebral artery.  Non visualization right posterior inferior cerebellar artery and left anterior inferior cerebellar artery.  Mild narrowing and irregularity of the basilar artery which is ectatic.  Mild to moderate narrowing and right posterior cerebral artery proximal to terminal branching.  MRI NECK:  Left internal carotid artery is occluded.  Post right carotid endarterectomy. No high-grade stenosis although there is mild narrowing at the distal anastomotic site.  Right vertebral artery is dominant. Moderate narrowing of the origin of the vertebral arteries bilaterally greater on the right.  Please see above for further detail.  These results were called by telephone at the time of interpretation on 2014-02-11 at 7:52 PM to [facility] patients nurse who verbally acknowledged these results.  Electronically Signed: By: Bridgett Larsson M.D. On: 02/11/2014 19:56   Mr Angiogram Neck W Wo Contrast  01/17/2014   ADDENDUM REPORT: 01/17/2014 11:32  ADDENDUM: Images reviewed with Dr. Anne Hahn. The patient has had episode of hypertension and seizures. The areas of altered signal intensity and enhancement involving the right frontal -temporal -occipital lobe and the posterior right temporal lobe as well as the left frontal and parietal lobe may therefore represent result of posterior reversible encephalopathy syndrome/hypertensive encephalopathy (hyper perfusion post carotid endarterectomy). This may have a reversible component. Presently only small areas hemorrhage is noted in the right occipital aspect.  The altered signal intensity within the right mid to anterior temporal lobe, operculum region and sub insular region may represent result of seizure activity (possibility of herpes discussed with Dr. Anne Hahn).  The restricted motion within the posterior right thalamus may represent result of acute infarction.  These findings would be best assessed by serial follow-up MR imaging.   Electronically Signed    By: Bridgett Larsson M.D.   On: 01/17/2014 11:32   01/17/2014   CLINICAL DATA:  Post right carotid endarterectomy 01/06/2014. Found down. Hypertension. Hyperlipidemia.  EXAM: MRI HEAD WITHOUT AND WITH CONTRAST AND MRA HEAD WITHOUT AND WITH CONTRAST AND MRI NECK WITHOUT AND WITH CONTRAST  TECHNIQUE: Multiplanar, multiecho pulse sequences of the brain and surrounding structures were obtained without and with intravenous contrast. Angiographic images of the head were obtained using MRA technique without and with contrast. Multiplanar, multiecho pulse sequences of the neck and surrounding structures were obtained without and with intravenous contrast.  CONTRAST:  10 cc MultiHance.  COMPARISON:  2014-02-11 and 12/30/2013 CT.  No comparison MR.  FINDINGS: MRI HEAD FINDINGS  Occluded left internal carotid artery.  Recent right carotid endarterectomy.  Large area of altered signal intensity involving the right frontal -parietal -occipital lobe and posterior right temporal lobe as well as left frontal  and parietal lobe which demonstrates patchy enhancement. Small area of blood breakdown products right occipital lobe. Appearance is suggestive of the presence of subacute infarcts.  Altered signal intensity mid to anterior right temporal lobe, operculum region and subinsular region as well as involving the posterior right thalamus suggestive of acute infarct (infection such as herpes felt to be less likely consideration given the clinical setting).  Remote right coronal radiata/centrum semiovale and caudate head infarct with dilated right lateral ventricle and wallerian degeneration.  Atrophy without hydrocephalus.  No intracranial mass lesion seen separate from the above described findings.  Pooled secretions posterior superior nasopharynx.  Mild paranasal sinus mucosal thickening.  Cervical medullary junction, pituitary region, pineal region and orbital structures unremarkable.  MRA HEAD FINDINGS  Occluded left internal carotid  artery. Reconstitution of flow at the left carotid terminus is not adequate as there is poor flow seen within the left middle cerebral artery branches and at the level of the left carotid terminus.  Ectatic right internal carotid artery cavernous and supra clinoid segment. No saccular aneurysm separate from the atherosclerotic type changes.  Middle cerebral artery branch vessel mild irregularity.  Moderate narrowing left vertebral artery. Mild irregularity right vertebral artery.  Non visualization right posterior inferior cerebellar artery and left anterior inferior cerebellar artery.  Mild narrowing and irregularity of the basilar artery which is ectatic.  Fetal type contribution to the right posterior cerebral artery. Mild to moderate narrowing and right posterior cerebral artery proximal to terminal branching.  MRI NECK FINDINGS  Three vessel aortic arch.  Left internal carotid artery is occluded.  Post right carotid endarterectomy remains patent. No high-grade stenosis although there is mild narrowing at the distal anastomotic site.  Right vertebral artery is dominant. Moderate narrowing of the origin of the vertebral arteries bilaterally greater on the right.  No high-grade stenosis of either subclavian artery.  IMPRESSION: MRI HEAD :  Occluded left internal carotid artery.  Recent right carotid endarterectomy.  Subacute enhancing large infarcts involving right frontal -parietal -occipital lobe and posterior right temporal lobe as well as left frontal and parietal lobe. Small area of blood breakdown products right occipital lobe.  Acute infarcts involving mid to anterior right temporal lobe, operculum region and subinsular region as well as involving the posterior right thalamus suggestive of acute infarct (infection such as herpes felt to be less likely consideration given the clinical setting).  Remote right coronal radiata/centrum semiovale and caudate head infarct with dilated right lateral ventricle and  wallerian degeneration.  MRA HEAD:  Occluded left internal carotid artery. Reconstitution of flow at the left carotid terminus is not adequate as there is poor flow seen within the left middle cerebral artery branches and at the level of the left carotid terminus.  Ectatic right internal carotid artery cavernous and supra clinoid segment.  Middle cerebral artery branch vessel mild irregularity.  Moderate narrowing left vertebral artery. Mild irregularity right vertebral artery.  Non visualization right posterior inferior cerebellar artery and left anterior inferior cerebellar artery.  Mild narrowing and irregularity of the basilar artery which is ectatic.  Mild to moderate narrowing and right posterior cerebral artery proximal to terminal branching.  MRI NECK:  Left internal carotid artery is occluded.  Post right carotid endarterectomy. No high-grade stenosis although there is mild narrowing at the distal anastomotic site.  Right vertebral artery is dominant. Moderate narrowing of the origin of the vertebral arteries bilaterally greater on the right.  Please see above for further detail.  These results were  called by telephone at the time of interpretation on 10/04/2014 at 7:52 PM to [facility] who verbally acknowledged these results.  Electronically Signed: By: Bridgett LarssonSteve  Olson M.D. On: 012/27/2015 19:56   Mr Laqueta JeanBrain W AVWo Contrast  01/17/2014   ADDENDUM REPORT: 01/17/2014 11:32  ADDENDUM: Images reviewed with Dr. Anne HahnWillis. The patient has had episode of hypertension and seizures. The areas of altered signal intensity and enhancement involving the right frontal -temporal -occipital lobe and the posterior right temporal lobe as well as the left frontal and parietal lobe may therefore represent result of posterior reversible encephalopathy syndrome/hypertensive encephalopathy (hyper perfusion post carotid endarterectomy). This may have a reversible component. Presently only small areas hemorrhage is noted in the  right occipital aspect.  The altered signal intensity within the right mid to anterior temporal lobe, operculum region and sub insular region may represent result of seizure activity (possibility of herpes discussed with Dr. Anne HahnWillis).  The restricted motion within the posterior right thalamus may represent result of acute infarction.  These findings would be best assessed by serial follow-up MR imaging.   Electronically Signed   By: Bridgett LarssonSteve  Olson M.D.   On: 01/17/2014 11:32   01/17/2014   CLINICAL DATA:  Post right carotid endarterectomy 01/06/2014. Found down. Hypertension. Hyperlipidemia.  EXAM: MRI HEAD WITHOUT AND WITH CONTRAST AND MRA HEAD WITHOUT AND WITH CONTRAST AND MRI NECK WITHOUT AND WITH CONTRAST  TECHNIQUE: Multiplanar, multiecho pulse sequences of the brain and surrounding structures were obtained without and with intravenous contrast. Angiographic images of the head were obtained using MRA technique without and with contrast. Multiplanar, multiecho pulse sequences of the neck and surrounding structures were obtained without and with intravenous contrast.  CONTRAST:  10 cc MultiHance.  COMPARISON:  012/27/2015 and 12/30/2013 CT.  No comparison MR.  FINDINGS: MRI HEAD FINDINGS  Occluded left internal carotid artery.  Recent right carotid endarterectomy.  Large area of altered signal intensity involving the right frontal -parietal -occipital lobe and posterior right temporal lobe as well as left frontal and parietal lobe which demonstrates patchy enhancement. Small area of blood breakdown products right occipital lobe. Appearance is suggestive of the presence of subacute infarcts.  Altered signal intensity mid to anterior right temporal lobe, operculum region and subinsular region as well as involving the posterior right thalamus suggestive of acute infarct (infection such as herpes felt to be less likely consideration given the clinical setting).  Remote right coronal radiata/centrum semiovale and caudate  head infarct with dilated right lateral ventricle and wallerian degeneration.  Atrophy without hydrocephalus.  No intracranial mass lesion seen separate from the above described findings.  Pooled secretions posterior superior nasopharynx.  Mild paranasal sinus mucosal thickening.  Cervical medullary junction, pituitary region, pineal region and orbital structures unremarkable.  MRA HEAD FINDINGS  Occluded left internal carotid artery. Reconstitution of flow at the left carotid terminus is not adequate as there is poor flow seen within the left middle cerebral artery branches and at the level of the left carotid terminus.  Ectatic right internal carotid artery cavernous and supra clinoid segment. No saccular aneurysm separate from the atherosclerotic type changes.  Middle cerebral artery branch vessel mild irregularity.  Moderate narrowing left vertebral artery. Mild irregularity right vertebral artery.  Non visualization right posterior inferior cerebellar artery and left anterior inferior cerebellar artery.  Mild narrowing and irregularity of the basilar artery which is ectatic.  Fetal type contribution to the right posterior cerebral artery. Mild to moderate narrowing and right posterior cerebral  artery proximal to terminal branching.  MRI NECK FINDINGS  Three vessel aortic arch.  Left internal carotid artery is occluded.  Post right carotid endarterectomy remains patent. No high-grade stenosis although there is mild narrowing at the distal anastomotic site.  Right vertebral artery is dominant. Moderate narrowing of the origin of the vertebral arteries bilaterally greater on the right.  No high-grade stenosis of either subclavian artery.  IMPRESSION: MRI HEAD :  Occluded left internal carotid artery.  Recent right carotid endarterectomy.  Subacute enhancing large infarcts involving right frontal -parietal -occipital lobe and posterior right temporal lobe as well as left frontal and parietal lobe. Small area of  blood breakdown products right occipital lobe.  Acute infarcts involving mid to anterior right temporal lobe, operculum region and subinsular region as well as involving the posterior right thalamus suggestive of acute infarct (infection such as herpes felt to be less likely consideration given the clinical setting).  Remote right coronal radiata/centrum semiovale and caudate head infarct with dilated right lateral ventricle and wallerian degeneration.  MRA HEAD:  Occluded left internal carotid artery. Reconstitution of flow at the left carotid terminus is not adequate as there is poor flow seen within the left middle cerebral artery branches and at the level of the left carotid terminus.  Ectatic right internal carotid artery cavernous and supra clinoid segment.  Middle cerebral artery branch vessel mild irregularity.  Moderate narrowing left vertebral artery. Mild irregularity right vertebral artery.  Non visualization right posterior inferior cerebellar artery and left anterior inferior cerebellar artery.  Mild narrowing and irregularity of the basilar artery which is ectatic.  Mild to moderate narrowing and right posterior cerebral artery proximal to terminal branching.  MRI NECK:  Left internal carotid artery is occluded.  Post right carotid endarterectomy. No high-grade stenosis although there is mild narrowing at the distal anastomotic site.  Right vertebral artery is dominant. Moderate narrowing of the origin of the vertebral arteries bilaterally greater on the right.  Please see above for further detail.  These results were called by telephone at the time of interpretation on 01/07/2014 at 7:52 PM to [facility] patients nurse who verbally acknowledged these results.  Electronically Signed: By: Bridgett Larsson M.D. On: 01/15/2014 19:56   Dg Chest Port 1 View  01/17/2014   CLINICAL DATA:  ETT placement.  EXAM: PORTABLE CHEST - 1 VIEW  COMPARISON:  DG CHEST 1V PORT dated 01/17/2014  FINDINGS: Endotracheal tube is  appreciated tip 4.5 cm above the carina. NGT is appreciated tip none of the of this study. Cardiac silhouette within the upper limits normal. Increased density is appreciated within the left lung base and blunting of the left costophrenic angle. Degenerative changes within the shoulders. No acute osseous abnormalities.  IMPRESSION: Atelectasis versus infiltrate left lung base and stable small left effusion.  Endotracheal tube unchanged. There has been advancement of the patient's NG tube.   Electronically Signed   By: Salome Holmes M.D.   On: 01/17/2014 16:26   Dg Chest Port 1 View  01/17/2014   CLINICAL DATA:  Evaluate lungs and endotracheal tube.  EXAM: PORTABLE CHEST - 1 VIEW  COMPARISON:  01/13/2014  FINDINGS: Endotracheal tube tip now projects 5.7 cm above the carina, which is higher than on the prior study although this difference may be due to differences in patient positioning/neck position.  Orogastric tube has its tip in the distal esophagus. It does not extend below the diaphragm.  There has been significant improvement in lung aeration. Interstitial  thickening has significantly decreased. There is mild hazy opacity at the left lung base likely a small effusion. No areas of lung consolidation.  IMPRESSION: 1. Marked improvement in interstitial pulmonary edema. Small residual left effusion. 2. Endotracheal tube tip projects 5.7 cm above the carina. Orogastric tube tip projects in the distal esophagus. Support apparatus is felt to be unchanged allowing for differences in patient positioning.   Electronically Signed   By: Amie Portland M.D.   On: 01/17/2014 08:18   Dg Chest Portable 1 View  05-Feb-2014   CLINICAL DATA:  Seizure.  Endotracheal tube placement.  EXAM: PORTABLE CHEST - 1 VIEW  COMPARISON:  01/02/2014  FINDINGS: Endotracheal tube has its tip 4 cm above the carina. Nasogastric tube has its tip can a hiatal hernia above the level of the diaphragm. There is interstitial and early alveolar  pulmonary edema. No effusion.  IMPRESSION: Acute pulmonary edema.  Endotracheal tube well positioned.  Nasogastric tube tip in a hiatal hernia, above the level of the diaphragm.   Electronically Signed   By: Paulina Fusi M.D.   On: Feb 05, 2014 14:17    CT of the brain   IMPRESSION:  CT head: Acute moderate right middle cerebral artery territory  infarct, involving the right temporal lobe. No hemorrhagic  conversion.  Remote right basal ganglia/corona radiata and remote right  temporoccipital infarcts, the latter of which appears slightly  larger though this could be technical. Moderate to severe white  matter changes suggest chronic small vessel ischemic disease.  CT cervical spine: No acute cervical spine fracture. Grade 1 C4-5  anterolisthesis on degenerative basis.  Postoperative changes of the right carotid space suggests recent  carotid endarterectomy. In addition, calcific atherosclerosis of the  origin left internal carotid artery, with central hypodensity which  may be seen with occlusion.  MRI of the brain   IMPRESSION:  MRI HEAD :  Occluded left internal carotid artery.  Recent right carotid endarterectomy.  Subacute enhancing large infarcts involving right frontal -parietal  -occipital lobe and posterior right temporal lobe as well as left  frontal and parietal lobe. Small area of blood breakdown products  right occipital lobe.  Acute infarcts involving mid to anterior right temporal lobe,  operculum region and subinsular region as well as involving the  posterior right thalamus suggestive of acute infarct (infection such  as herpes felt to be less likely consideration given the clinical  setting).  Remote right coronal radiata/centrum semiovale and caudate head  infarct with dilated right lateral ventricle and wallerian  degeneration.  MRA of the brain   Occluded left internal carotid artery. Reconstitution of flow at the  left carotid terminus is not adequate as  there is poor flow seen  within the left middle cerebral artery branches and at the level of  the left carotid terminus.  Ectatic right internal carotid artery cavernous and supra clinoid  segment.  Middle cerebral artery branch vessel mild irregularity.  Moderate narrowing left vertebral artery. Mild irregularity right  vertebral artery.  Non visualization right posterior inferior cerebellar artery and  left anterior inferior cerebellar artery.  Mild narrowing and irregularity of the basilar artery which is  ectatic.  Mild to moderate narrowing and right posterior cerebral artery  proximal to terminal branching.   MRI NECK:  Left internal carotid artery is occluded.  Post right carotid endarterectomy. No high-grade stenosis although  there is mild narrowing at the distal anastomotic site.  Right vertebral artery is dominant. Moderate narrowing of the  origin  of the vertebral arteries bilaterally greater on the right.   2D Echocardiogram   Study Conclusions  - Left ventricle: The cavity size was normal. There was mild concentric hypertrophy. Systolic function was normal. The estimated ejection fraction was in the range of 60% to 65%. Wall motion was normal; there were no regional wall motion abnormalities. Features are consistent with a pseudonormal left ventricular filling pattern, with concomitant abnormal relaxation and increased filling pressure (grade 2 diastolic dysfunction). Doppler parameters are consistent with elevated ventricular end-diastolic filling pressure. - Aortic valve: Cusp separation was severely reduced. There was mild stenosis. Valve area: 1.86cm^2(VTI). Valve area: 1.69cm^2 (Vmax). - Mitral valve: Calcified annulus. Mildly thickened, moderately calcified leaflets . Moderate regurgitation directed posteriorly. - Left atrium: The atrium was mildly dilated. - Tricuspid valve: Moderate regurgitation. - Pulmonary arteries: Systolic pressure was  moderately increased.  Carotid Doppler    CXR   IMPRESSION:  1. Marked improvement in interstitial pulmonary edema. Small  residual left effusion.  2. Endotracheal tube tip projects 5.7 cm above the carina.  Orogastric tube tip projects in the distal esophagus. Support  apparatus is felt to be unchanged allowing for differences in  patient positioning.  EKG   Normal sinus rhythm Possible Left atrial enlargement Nonspecific ST abnormality Abnormal ECG  Therapy Recommendations Pending  Physical Exam  General: The patient is comatose, intubated at the time of examination  Respiratory: Lung fields are clear  Cardiovascular: Regular rate and rhythm, no obvious murmurs or rubs are noted.  Abdomen: Soft, nontender, minimal bowel sounds  Skin: No significant peripheral edema is noted.   Neurologic Exam  Mental status: The patient comatose, intubated. No response to sternal rub is noted.  Cranial nerves: Facial symmetry is present. Intermittent jerking of the head to the left is seen. Eyes are mid position, pupils are equal, trace reactive to light.  Motor: The patient has voluntary movements of the extremities, some increase in motor tone of the legs is seen bilaterally.  Sensory examination: Slight flexion withdrawal of the lower extremities with pain stimulation, no gone chair movements seen. No response with pain stimulation of the upper extremities.  Coordination: The patient could not follow commands for cerebellar testing.  Gait and station: The gait could not be tested.  Reflexes: Deep tendon reflexes are symmetric. Toes are neutral bilaterally.    ASSESSMENT Ms. Jacqueline Cline is a 79 y.o. female presenting with left focal seizures, comatose state. MRI the brain is suggestive of a right thalamic stroke, large right parietal, temporal, and occipital stroke with acute and subacute components with some involvement in the left posterior regions as well. MRA shows  occlusion that is known of the left internal carotid artery, the right internal carotid artery is patent. The patient was on low-dose aspirin prior to admission.   Focal seizures, probable status epilepticus  Right greater left brain strokes by MRI  Known left internal carotid artery occlusion, right carotid endarterectomy  Hypertension  Dyslipidemia  Hospital day # 2  I have reviewed the MRI brain study is Dr. Constance Goltz. 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 This is the likely generator for the seizures, with projection to the right temporal lobe. Mild diffusion weighted changes are seen by MRI associated with the right temporal lobe secondary to status epilepticus. Prominent cortical changes seen posteriorly in the right greater than left hemispheres with associated enhancement is related to hyperperfusion, PRES syndrome. Status epilepticus  associated with thalamic ischemia can be extremely difficult to control. EEG today shows ongoing sharp wave activity with episodes of suppression. Prognosis in this case is guarded.  TREATMENT/PLAN  Rectal aspirin has been held  Vimpat for seizures, I will increase the Depacon and Vimpat dosing, re-add Keppra  Supportive care, critical care medicine involved  EEG evaluation, prolonged EEG  Follow clinical course  York Spaniel  01/18/2014 8:13 AM

## 2014-01-19 ENCOUNTER — Inpatient Hospital Stay (HOSPITAL_COMMUNITY): Payer: Medicare Other

## 2014-01-19 ENCOUNTER — Encounter (HOSPITAL_COMMUNITY): Payer: Self-pay | Admitting: *Deleted

## 2014-01-19 LAB — BASIC METABOLIC PANEL WITH GFR
BUN: 17 mg/dL (ref 6–23)
CO2: 23 meq/L (ref 19–32)
Calcium: 9.5 mg/dL (ref 8.4–10.5)
Chloride: 106 meq/L (ref 96–112)
Creatinine, Ser: 0.61 mg/dL (ref 0.50–1.10)
GFR calc Af Amer: >90 mL/min
GFR calc non Af Amer: 86 mL/min — ABNORMAL LOW
Glucose, Bld: 91 mg/dL (ref 70–99)
Potassium: 3.6 meq/L — ABNORMAL LOW (ref 3.7–5.3)
Sodium: 144 meq/L (ref 137–147)

## 2014-01-19 LAB — GLUCOSE, CAPILLARY
GLUCOSE-CAPILLARY: 106 mg/dL — AB (ref 70–99)
GLUCOSE-CAPILLARY: 90 mg/dL (ref 70–99)
GLUCOSE-CAPILLARY: 97 mg/dL (ref 70–99)
Glucose-Capillary: 102 mg/dL — ABNORMAL HIGH (ref 70–99)
Glucose-Capillary: 102 mg/dL — ABNORMAL HIGH (ref 70–99)
Glucose-Capillary: 85 mg/dL (ref 70–99)

## 2014-01-19 LAB — CBC
HCT: 38.3 % (ref 36.0–46.0)
Hemoglobin: 13 g/dL (ref 12.0–15.0)
MCH: 30.1 pg (ref 26.0–34.0)
MCHC: 33.9 g/dL (ref 30.0–36.0)
MCV: 88.7 fL (ref 78.0–100.0)
Platelets: 187 K/uL (ref 150–400)
RBC: 4.32 MIL/uL (ref 3.87–5.11)
RDW: 13.6 % (ref 11.5–15.5)
WBC: 9.2 K/uL (ref 4.0–10.5)

## 2014-01-19 LAB — VALPROIC ACID LEVEL: Valproic Acid Lvl: 86.2 ug/mL (ref 50.0–100.0)

## 2014-01-19 LAB — TRIGLYCERIDES: Triglycerides: 93 mg/dL

## 2014-01-19 MED ORDER — CHLORHEXIDINE GLUCONATE 0.12 % MT SOLN
15.0000 mL | Freq: Two times a day (BID) | OROMUCOSAL | Status: DC
  Administered 2014-01-19 – 2014-01-26 (×14): 15 mL via OROMUCOSAL
  Filled 2014-01-19 (×15): qty 15

## 2014-01-19 MED ORDER — VALPROATE SODIUM 500 MG/5ML IV SOLN
1000.0000 mg | Freq: Three times a day (TID) | INTRAVENOUS | Status: DC
  Administered 2014-01-19 – 2014-01-26 (×22): 1000 mg via INTRAVENOUS
  Filled 2014-01-19 (×27): qty 10

## 2014-01-19 MED ORDER — BIOTENE DRY MOUTH MT LIQD
15.0000 mL | Freq: Four times a day (QID) | OROMUCOSAL | Status: DC
  Administered 2014-01-20 – 2014-01-26 (×28): 15 mL via OROMUCOSAL

## 2014-01-19 MED ORDER — FAMOTIDINE 40 MG/5ML PO SUSR
20.0000 mg | Freq: Every day | ORAL | Status: DC
  Administered 2014-01-19 – 2014-01-20 (×2): 20 mg
  Filled 2014-01-19 (×3): qty 2.5

## 2014-01-19 NOTE — Clinical Documentation Improvement (Signed)
Possible Clinical Conditions?   _______STEMI  _______NSTEMI _______Other Condition _______Cannot Clinically Determine    Risk Factors: Mild elevation troponin, trending down per 4/12 progress notes.  Diagnostics: 4/11: 0.64 4/10: 1.02 4/10: 0.37  Thank You, Marciano SequinWanda Mathews-Bethea,RN,BSN, Clinical Documentation Specialist:  [phone]  [facility]- [facility] Information Management ________________________________________________________________________________________________Documentation Clarification # 2:   Possible Clinical Conditions?  ______Accelerated Hypertension ______Malignant Hypertension ______Other Condition ______Cannot Clinically Determine   Risk Factors: Hypertensive emergency per 4/12 progress notes. 4/13: bp up to 180/73 per doc flow sheets 4/10: bp up to 212/109 per doc flow sheets  Thank You, Marciano SequinWanda Mathews-Bethea,RN,BSN, Clinical Documentation Specialist

## 2014-01-19 NOTE — Progress Notes (Signed)
PULMONARY / CRITICAL CARE MEDICINE   Name: Jacqueline StablerBarbara F Cline MRN: [id number] DOB: 07/15/1935    ADMISSION DATE:  05/29/14  REFERRING MD :  EDP  PRIMARY SERVICE: PCCM   CHIEF COMPLAINT:  CVA w AMS / Resp Fx  BRIEF PATIENT DESCRIPTION: 79 y/o F with recent R carotid stent placement who [facility] with AMS & Seizures.  Found to have R MCA CVA, seizures, and acute respiratory failure.    SIGNIFICANT EVENTS/STUDIES:  4/10 - admit with R MCA CVA, seizures, resp failure, intubated 4/10 Neuro Consult: ASA, Keppra initiated. MRI, EEG, Echo ordered 4/10 CT of head/neck:  acute moderate R MCA CVA without hemorrhagic conversion, remote R basal gaglia & right temporoccipital infarct, severe white matter changes. Neck without acute fracture. 4/10 EEG: This is an abnormal EEG due to general background slowing. Also noted over the right hemisphere is frequent epileptiform activity with phase reversal at T4. This is consistent with the patient's history of new onset seizure and acute right MCA infarct. No evidence of electrographic status epilepticus was noted.  4/10 MRI brain: Large area of altered signal intensity involving the right frontal -parietal -occipital lobe and posterior right temporal lobe as well as left frontal and parietal lobe which demonstrates patchy enhancement. Small area of blood breakdown products right occipital lobe. Appearance is suggestive of the presence of subacute infarcts. Altered signal intensity mid to anterior right temporal lobe, operculum region and subinsular region as well as involving the posterior right thalamus suggestive of acute infarct (infection such as herpes felt to be less likely consideration given the clinical setting). Remote right coronal radiata/centrum semiovale and caudate head infarct with dilated right lateral ventricle and wallerian degeneration. ADDENDUM: Images reviewed with Dr. Anne HahnWillis. The patient has had episode of hypertension and  seizures. The areas of altered signal intensity and enhancement involving the right frontal -temporal -occipital lobe and the posterior right temporal lobe as well as the left frontal and parietal lobe may therefore represent result of posterior reversible encephalopathy syndrome/hypertensive encephalopathy (hyper perfusion post carotid endarterectomy). This may have a reversible component. Presently only small areas hemorrhage is noted in the right occipital aspect. 4/10 MRA head/neck: Occluded left internal carotid artery. Reconstitution of flow at the left carotid terminus is not adequate as there is poor flow seen within the left middle cerebral artery branches and at the level of the left carotid terminus. Ectatic right internal carotid artery cavernous and supra clinoid segment. Middle cerebral artery branch vessel mild irregularity. Moderate narrowing left vertebral artery. Mild irregularity right vertebral artery. Non visualization right posterior inferior cerebellar artery and left anterior inferior cerebellar artery. Mild narrowing and irregularity of the basilar artery which is ectatic. Mild to moderate narrowing and right posterior cerebral artery proximal to terminal branching. 4/11 EEG: This is an abnormal EEG due to clinical and electrographic seizure activity emanating from the right temporal lobe.  4/11 Echocardiogram: LVEF 60-65%. Mild LVH. Grade 2 diastolic dysfunction. Moderate MR. PASP est 51 mmHg 4/12 Overnight EEG: This is an abnormal EEG. Although burst-suppression activity is noted there is concern that the prolonged burst activity seen may represent nonclinical seizure activity. Clinical correlation recommended.  4/13 Overnight EEG: This is an abnormal study. There are near continuous right hemisphere sharps with a temporal and parietal maxima, indicating a partial seizure disorder with seizures arising from the right temporal lobe and frequent electrographic seizures, from a  predominantly right temporal origin.There is also diffuse slowing and disorganization on the  left hemisphere, indicative of a superimposed severe encephalopathy, of nonspecific etiology, consider ictal encephalopathy, toxic, metabolic or drug induced etiologies.     LINES / TUBES: ETT 4/10 >>   CULTURES:   ANTIBIOTICS:   INTERVAL HX:   Unresponsive on propofol  VITAL SIGNS: Temp:  [97.8 F (36.6 C)-99.5 F (37.5 C)] 98.1 F (36.7 C) (04/13 0800) Pulse Rate:  [64-157] 71 (04/13 1200) Resp:  [16-35] 21 (04/13 1200) BP: (112-193)/(50-91) 175/72 mmHg (04/13 1200) SpO2:  [99 %-100 %] 100 % (04/13 1200) FiO2 (%):  [30 %] 30 % (04/13 1239) Weight:  [60.4 kg (133 lb 2.5 oz)] 60.4 kg (133 lb 2.5 oz) (04/13 0500)  HEMODYNAMICS:    VENTILATOR SETTINGS: Vent Mode:  [-] PRVC FiO2 (%):  [30 %] 30 % Set Rate:  [18 bmp] 18 bmp Vt Set:  [460 mL] 460 mL PEEP:  [5 cmH20] 5 cmH20 Plateau Pressure:  [13 cmH20-15 cmH20] 14 cmH20  INTAKE / OUTPUT: Intake/Output     04/12 0701 - 04/13 0700 04/13 0701 - 04/14 0700   I.V. (mL/kg) 1863 (30.8) 438.9 (7.3)   NG/GT 840 175   IV Piggyback 855    Total Intake(mL/kg) 3558 (58.9) 613.9 (10.2)   Urine (mL/kg/hr) 2130 (1.5) 160 (0.4)   Total Output 2130 160   Net +1428 +453.9          PHYSICAL EXAMINATION: General:  Elderly female on vent, NAD Neuro: Occasioanl UE twitching, comatose HEENT:  NCAT Cardiovascular: RRR s M Lungs:  resp's even/non-labored on vent, lungs bilaterally coarse Abdomen:  Round/soft, bsx4 active  Musculoskeletal:  No acute deformties Skin:  Warm/dry, no edema  LABS: I have reviewed all of today's lab results. Relevant abnormalities are discussed in the A/P section  CXR: bibasilar opacities  ASSESSMENT / PLAN:  NEUROLOGIC A:   Acute R hemispheric CVA Status epilepticus -temporal focus Acute Encephalopathy  Suspected PRES Poor prognosis P:   Further eval and mgmt per Neuro  PULMONARY A: Acute  Respiratory Failure - in setting of CVA P:   Cont full vent support - settings reviewed and/or adjusted Cont vent bundle Daily SBT if/when meets criteria  CARDIOVASCULAR A:  Mild Elevation Troponin, no indication for further eval H/O CAD HTN Emergency Carotid Occlusion (L) Carotid Stent (R, on 3/31) P:  Cont ASA DNR in event arrest  BP control - currently controlled on propofol   RENAL A:   Acute Kidney Injury, resolved P:   Monitor BMET intermittently Monitor I/Os Correct electrolytes as indicated  GASTROINTESTINAL A:   No issues P:   SUP: enteral famotidine Cont TFs   HEMATOLOGIC A:   No issues P:  DVT Px: SQ heparin Monitor CBC intermittently  INFECTIOUS A:   Possible Aspiration -  bibasilar changes  P:   monitor fever curve / leukocytosis Cont to withold abx for now  ENDOCRINE A:   No acute issues   P:   -monitor BMP glucose trends  GLOBAL:    I have personally obtained a history, examined the patient, evaluated laboratory and imaging results, formulated the assessment and plan and placed orders.  CRITICAL CARE: The patient is critically ill with multiple organ systems failure and requires high complexity decision making for assessment and support, frequent evaluation and titration of therapies, application of advanced monitoring technologies and extensive interpretation of multiple databases. Critical Care Time devoted to patient care services described in this note is 35 minutes.   Billy Fischeravid Kenny Rea, MD ; [facility] service Mobile [phone].  After 5:30 PM or weekends, call [phone]

## 2014-01-19 NOTE — Progress Notes (Signed)
UR completed.  Gelsey Amyx, RN BSN [facility] CCM Trauma/Neuro ICU Case Manager [phone]  

## 2014-01-19 NOTE — Progress Notes (Signed)
LTM EEG was D/C while pt had CT scan. LTM EEG now restarted.

## 2014-01-19 NOTE — Progress Notes (Signed)
Stroke Team Progress Note  HISTORY   Jacqueline Cline is an 79 y.o. female with known left complete carotid occlusion who recently underwent a right carotid stent on 01/06/14. Per son she ws doing very well, BP controled and lived alone. Her son had called patient at 8 PM last night and she held a normal conversation. He came to pick her up for a Dr. Appointment today at 12:30 and found her face down on the floor with left arm and leg jerking. He states she was awake and able to speak to him. She made mention she had fallen at 8 am this morning. On arrival patient had witnessed left sided seizure, BP 200/100 and was given 2.5 mg Versed. After versed administered she was noted to have airway compromise and intubated. Per note patient then demonstrated right sided seizure activity and was given 2 mg Ativan. Currently she is intubated and sedated on Propofol. She follows no commands and shows no sign of clinical seizure.  Date last known well: Date: 01/15/2014  Time last known well: Time: 20:00  tPA Given: No: out of window   SUBJECTIVE The patient is intubated, sedated. Intermittent jerking of the head to the left is noted. Family is not at bedside.  OBJECTIVE Most recent Vital Signs: Filed Vitals:   01/19/14 0400 01/19/14 0500 01/19/14 0600 01/19/14 0700  BP: 164/64 127/60 166/65 148/55  Pulse: 76 67 75 65  Temp:      TempSrc:      Resp: 16 18 20 18   Height:      Weight:  133 lb 2.5 oz (60.4 kg)    SpO2: 100% 100% 100% 100%   CBG (last 3)   Recent Labs  01/17/14 2326 01/18/14 0334 01/18/14 1946  GLUCAP 99 106* 105*    IV Fluid Intake:   . sodium chloride 75 mL (01/18/14 1442)  . midazolam (VERSED) infusion Stopped (01/11/2014 1534)  . propofol 35 mcg/kg/min (01/19/14 0339)    MEDICATIONS  . antiseptic oral rinse  15 mL Mouth Rinse QID  . chlorhexidine  15 mL Mouth Rinse BID  . famotidine (PEPCID) IV  20 mg Intravenous Q24H  . feeding supplement (VITAL HIGH PROTEIN)  1,000 mL  Per Tube Q24H  . heparin  5,000 Units Subcutaneous 3 times per day  . lacosamide (VIMPAT) IV  200 mg Intravenous Q12H  . levETIRAcetam  1,000 mg Intravenous 3 times per day  . multivitamin  5 mL Oral Daily  . valproic acid (DEPACON) IVPB  750 mg Intravenous 3 times per day   PRN:  sodium chloride, albuterol, etomidate, fentaNYL, rocuronium  Diet:  NPO  Activity:  Bedrest DVT Prophylaxis:  SQ heparin  CLINICALLY SIGNIFICANT STUDIES Basic Metabolic Panel:   Recent Labs Lab 01/18/14 0304 01/19/14 0429  NA 141 144  K 3.5* 3.6*  CL 105 106  CO2 24 23  GLUCOSE 102* 91  BUN 15 17  CREATININE 0.61 0.61  CALCIUM 9.2 9.5  MG 1.7  --   PHOS 1.9*  --    Liver Function Tests:   Recent Labs Lab 01/18/2014 1402  AST 170*  ALT 172*  ALKPHOS 74  BILITOT 0.4  PROT 7.1  ALBUMIN 3.5   CBC:  Recent Labs Lab 01/31/2014 1402  01/18/14 0304 01/19/14 0429  WBC 18.9*  < > 10.2 9.2  NEUTROABS 16.7*  --   --   --   HGB 14.0  < > 12.4 13.0  HCT 42.2  < > 37.6  38.3  MCV 91.3  < > 88.9 88.7  PLT 247  < > 169 187  < > = values in this interval not displayed. Coagulation:   Recent Labs Lab 01/09/2014 1402  LABPROT 12.2  INR 0.92   Cardiac Enzymes:   Recent Labs Lab 01/30/2014 1402 01/17/2014 2215 01/17/14 0341  CKTOTAL 242*  --   --   TROPONINI  --  1.02* 0.64*   Urinalysis:   Recent Labs Lab 01/11/2014 1405  COLORURINE YELLOW  LABSPEC 1.022  PHURINE 5.5  GLUCOSEU NEGATIVE  HGBUR NEGATIVE  BILIRUBINUR NEGATIVE  KETONESUR NEGATIVE  PROTEINUR 30*  UROBILINOGEN 0.2  NITRITE NEGATIVE  LEUKOCYTESUR NEGATIVE   Lipid Panel    Component Value Date/Time   CHOL 263* 03/10/2013 0916   TRIG 95 01/20/2014 2215   HDL 48.50 03/10/2013 0916   CHOLHDL 5 03/10/2013 0916   VLDL 25.2 03/10/2013 0916   LDLCALC 106* 07/21/2010 0908   HgbA1C  No results found for this basename: HGBA1C    Urine Drug Screen:     Component Value Date/Time   LABOPIA NONE DETECTED 01/19/2014 1405    COCAINSCRNUR NONE DETECTED 01/15/2014 1405   LABBENZ POSITIVE* 02/01/2014 1405   AMPHETMU NONE DETECTED 01/27/2014 1405   THCU NONE DETECTED 01/14/2014 1405   LABBARB NONE DETECTED 02/03/2014 1405    Alcohol Level:   Recent Labs Lab 01/21/2014 1402  ETH <11    Dg Chest Port 1 View  01/19/2014   CLINICAL DATA:  Pneumonia  EXAM: PORTABLE CHEST - 1 VIEW  COMPARISON:  DG CHEST 1V PORT dated 01/17/2014  FINDINGS: Endotracheal and NG tubes are stable. Hazy opacity at the left base is stable. It is increased at the right base. This is compatible with a combination of pleural fluid and pulmonary parenchymal disease. No pneumothorax.  IMPRESSION: Bibasilar opacity is stable on the left and increased on the right.   Electronically Signed   By: Maryclare BeanArt  Hoss M.D.   On: 01/19/2014 07:49   Dg Chest Port 1 View  01/17/2014   CLINICAL DATA:  ETT placement.  EXAM: PORTABLE CHEST - 1 VIEW  COMPARISON:  DG CHEST 1V PORT dated 01/17/2014  FINDINGS: Endotracheal tube is appreciated tip 4.5 cm above the carina. NGT is appreciated tip none of the of this study. Cardiac silhouette within the upper limits normal. Increased density is appreciated within the left lung base and blunting of the left costophrenic angle. Degenerative changes within the shoulders. No acute osseous abnormalities.  IMPRESSION: Atelectasis versus infiltrate left lung base and stable small left effusion.  Endotracheal tube unchanged. There has been advancement of the patient's NG tube.   Electronically Signed   By: Salome HolmesHector  Cooper M.D.   On: 01/17/2014 16:26    CT of the brain   IMPRESSION:  CT head: Acute moderate right middle cerebral artery territory  infarct, involving the right temporal lobe. No hemorrhagic  conversion.  Remote right basal ganglia/corona radiata and remote right  temporoccipital infarcts, the latter of which appears slightly  larger though this could be technical. Moderate to severe white  matter changes suggest chronic small  vessel ischemic disease.  CT cervical spine: No acute cervical spine fracture. Grade 1 C4-5  anterolisthesis on degenerative basis.  Postoperative changes of the right carotid space suggests recent  carotid endarterectomy. In addition, calcific atherosclerosis of the  origin left internal carotid artery, with central hypodensity which  may be seen with occlusion.  MRI of the brain   IMPRESSION:  MRI HEAD :  Occluded left internal carotid artery.  Recent right carotid endarterectomy.  Subacute enhancing large infarcts involving right frontal -parietal  -occipital lobe and posterior right temporal lobe as well as left  frontal and parietal lobe. Small area of blood breakdown products  right occipital lobe.  Acute infarcts involving mid to anterior right temporal lobe,  operculum region and subinsular region as well as involving the  posterior right thalamus suggestive of acute infarct (infection such  as herpes felt to be less likely consideration given the clinical  setting).  Remote right coronal radiata/centrum semiovale and caudate head  infarct with dilated right lateral ventricle and wallerian  degeneration.  MRA of the brain   Occluded left internal carotid artery. Reconstitution of flow at the  left carotid terminus is not adequate as there is poor flow seen  within the left middle cerebral artery branches and at the level of  the left carotid terminus.  Ectatic right internal carotid artery cavernous and supra clinoid  segment.  Middle cerebral artery branch vessel mild irregularity.  Moderate narrowing left vertebral artery. Mild irregularity right  vertebral artery.  Non visualization right posterior inferior cerebellar artery and  left anterior inferior cerebellar artery.  Mild narrowing and irregularity of the basilar artery which is  ectatic.  Mild to moderate narrowing and right posterior cerebral artery  proximal to terminal branching.   MRI NECK:  Left  internal carotid artery is occluded.  Post right carotid endarterectomy. No high-grade stenosis although  there is mild narrowing at the distal anastomotic site.  Right vertebral artery is dominant. Moderate narrowing of the origin  of the vertebral arteries bilaterally greater on the right.   2D Echocardiogram   Study Conclusions  - Left ventricle: The cavity size was normal. There was mild concentric hypertrophy. Systolic function was normal. The estimated ejection fraction was in the range of 60% to 65%. Wall motion was normal; there were no regional wall motion abnormalities. Features are consistent with a pseudonormal left ventricular filling pattern, with concomitant abnormal relaxation and increased filling pressure (grade 2 diastolic dysfunction). Doppler parameters are consistent with elevated ventricular end-diastolic filling pressure. - Aortic valve: Cusp separation was severely reduced. There was mild stenosis. Valve area: 1.86cm^2(VTI). Valve area: 1.69cm^2 (Vmax). - Mitral valve: Calcified annulus. Mildly thickened, moderately calcified leaflets . Moderate regurgitation directed posteriorly. - Left atrium: The atrium was mildly dilated. - Tricuspid valve: Moderate regurgitation. - Pulmonary arteries: Systolic pressure was moderately increased.  Carotid Doppler    CXR   IMPRESSION:  1. Marked improvement in interstitial pulmonary edema. Small  residual left effusion.  2. Endotracheal tube tip projects 5.7 cm above the carina.  Orogastric tube tip projects in the distal esophagus. Support  apparatus is felt to be unchanged allowing for differences in  patient positioning.  EKG   Normal sinus rhythm Possible Left atrial enlargement Nonspecific ST abnormality Abnormal ECG  Therapy Recommendations Pending  Physical Exam  General: The patient is comatose, intubated at the time of examination  Respiratory: Lung fields are clear  Cardiovascular: Regular  rate and rhythm, no obvious murmurs or rubs are noted.  Abdomen: Soft, nontender, minimal bowel sounds  Skin: No significant peripheral edema is noted.   Neurologic Exam  Mental status: The patient comatose, intubated. No response to sternal rub is noted.  Cranial nerves: Facial symmetry is present. Intermittent jerking of the head to the left is seen. Eyes are mid position, pupils are equal, trace reactive to light.  Motor: The patient has no voluntary movements of the extremities.  Sensory examination: Patient is decerebrate bilaterally with sternal rub.  Coordination: The patient could not follow commands for cerebellar testing.  Gait and station: The gait could not be tested.  Reflexes: Deep tendon reflexes are symmetric. Toes are neutral bilaterally.    ASSESSMENT Ms. Doran StablerBarbara F Cline is a 79 y.o. female presenting with left focal seizures, comatose state. MRI the brain is suggestive of a right thalamic stroke, large right parietal, temporal, and occipital stroke with acute and subacute components with some involvement in the left posterior regions as well. MRA shows occlusion that is known of the left internal carotid artery, the right internal carotid artery is patent. The patient was on low-dose aspirin prior to admission.   Focal seizures, probable status epilepticus  Right greater left brain strokes by MRI  Known left internal carotid artery occlusion, right carotid endarterectomy  Hypertension  Dyslipidemia  Hospital day # 3  I have reviewed the MRI brain study is Dr. Constance Goltzlson. 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 This is the likely generator for the seizures, with projection to the right temporal lobe. Mild diffusion weighted changes are seen by MRI associated with the right temporal lobe secondary to status epilepticus. Prominent cortical changes seen posteriorly in the right greater than  left hemispheres with associated enhancement is related to hyperperfusion, PRES syndrome. Status epilepticus associated with thalamic ischemia can be extremely difficult to control. EEG today shows ongoing sharp wave activity with episodes of suppression. Prognosis in this case is guarded.  The patient continues to have seizures ongoing, head jerking to the left, ongoing sharp wave activity on EEG study. I am concerned that the seizures cannot be fully controlled, prognosis is poor. Examination now shows decerebration bilaterally.  TREATMENT/PLAN  Rectal aspirin has been held  Continue Vimpat, Keppra, Depacon, increase Depacon dosing  Supportive care, critical care medicine involved  EEG evaluation, prolonged EEG  CT head today  Follow clinical course  York SpanielCharles K Willis  01/19/2014 8:13 AM

## 2014-01-19 NOTE — Procedures (Addendum)
DAY 6 LTM:  This dictation will cover the patients recording from 4/16 7:30 am to 4/17 7:30 am. The study is reviewed in its entirety.  Initially study remains in burst suppression, as the study progresses, periods of suppression decrease and right hemisphere sharps reappear, at times periodic. There is diffuse delta activity seen intermittently as well, with periods of suppression still persisting, but shorter in duration. The overall appearance in terms of periodicity and frequency is perhaps slightly better than that on prior EEGs before burst suppression.  There are no clear electrographic seizures captured during this period of recording.    DAY 5 LTM  This dictation is for day 5 of the patient's recording to cover 4/15 7:30 am to 7:30 am 4/16. Initial findings are unchanged of periodic epileptiform discharges in runs lasting upto 20 seconds. At about 4:30 pm on 4/15 EEG starts to get more attenuated, with some periods of longer suppression, about 8:30 pm onwards to closing, there is fairly consistent bust suppression pattern achieved, with suppression lasting 4-5 seconds then with 3-4 seconds of bursts, still containing some right hemisphere discharges.   Impression: Interval achievement of burst suppression pattern with right hemisphere epileptiform activity and resolution of periodic epileptiform discharges starting about 8:30 pm on 4/15    DAY 4 LTM: This dictation will cover day 4 of the patient LTM from 4/14 7:30 am to 4/15 7:30 am. This study is reviewed in its entirety. Essentially EEG is unchanged with right hemisphere PLEDs, throughout, lasting 3-20 seconds, with 1-2 seconds of suppression. There is again no clear evolution, though some of the longer bursts may represent brief electrographic seizures. There is note made from nurse at 1630 of left arm twitching, during this time on EEG there is no significant change in EEG pattern.  Impression: This is an abnormal EEG with continuous  right hemisphere PLEDs, interspersed with brief periods of suppression. Several long periods of right hemisphere epileptiform discharges, during which sub clinical seizure is not completely excluded.     Day3 LTM update:  This dictation is to cover day 3 of the patient's study, from 01/19/14 at 7:30 am till 01/20/14 at 7:30 am. This study is reviewed in its entirety. During this 24 hour period, there are continuous right sided parieto-temporal sharps, that occur periodically, in runs lasting 3-10 seconds, with approximately 1-2 seconds of suppression. These at times resemble Periodic Lateralized Epileptiform Discharges or PLEDs. There is no clear evolution in amplitude or frequency to suggest electrographic seizures in this period of recording.   Impression: There remains evidence of right hemisphere cortical irritability predominatly temporal and parietal, and underlying cortical destruction in this region, as evidence by the presence of PLEDs. There is no clear evidence of electrographic seizures in this portion of the patient's study.     History: 79 year old with left sided twitching and jerking, as well as altered mental status.  Current Medications: Keppra, Vimpat, Depakote, versed and propofol.  EEG Description:This is a 24 hour video long term monitoring study, obtained using the international 10-20 system of electrode placement. This dictation will cover day 2 of the patient's study starting on 4/12-4/13 at 7 AM.  Throughout the recording there is no normal background noted. There are right sided sharps, with electrophysiological maxima at P4, T4 and T6, which occur frequently , near continuously through the recording. These become rhythmic and frequent runs of 4-5 cps, right hemishphere sharp activity is seen, with periods of suppression every 10 seconds or so. These  runs likely represent frequent electrographic seizure activity as no clear clinical activity is seen on video.  The left  hemisphere shows moderate to severe slowing and disorganization.   Interpretation: This is an abnormal study. There are near continuous right hemisphere sharps with a temporal and parietal maxima, indicating a partial seizure disorder with seizures arising from the right temporal lobe and frequent electrographic seizures, from a predominantly right temporal origin.There is also diffuse slowing and disorganization on the left hemisphere, indicative of a superimposed severe encephalopathy, of nonspecific etiology, consider ictal encephalopathy, toxic, metabolic or drug induced etiologies.

## 2014-01-20 ENCOUNTER — Other Ambulatory Visit: Payer: Self-pay

## 2014-01-20 ENCOUNTER — Inpatient Hospital Stay (HOSPITAL_COMMUNITY): Payer: Medicare Other

## 2014-01-20 DIAGNOSIS — G969 Disorder of central nervous system, unspecified: Secondary | ICD-10-CM

## 2014-01-20 DIAGNOSIS — R799 Abnormal finding of blood chemistry, unspecified: Secondary | ICD-10-CM

## 2014-01-20 DIAGNOSIS — I4891 Unspecified atrial fibrillation: Secondary | ICD-10-CM

## 2014-01-20 DIAGNOSIS — G9782 Other postprocedural complications and disorders of nervous system: Secondary | ICD-10-CM | POA: Diagnosis present

## 2014-01-20 LAB — CBC
HEMATOCRIT: 36.3 % (ref 36.0–46.0)
Hemoglobin: 12.2 g/dL (ref 12.0–15.0)
MCH: 29.8 pg (ref 26.0–34.0)
MCHC: 33.6 g/dL (ref 30.0–36.0)
MCV: 88.8 fL (ref 78.0–100.0)
Platelets: 179 10*3/uL (ref 150–400)
RBC: 4.09 MIL/uL (ref 3.87–5.11)
RDW: 14 % (ref 11.5–15.5)
WBC: 8.1 10*3/uL (ref 4.0–10.5)

## 2014-01-20 LAB — BASIC METABOLIC PANEL
BUN: 24 mg/dL — AB (ref 6–23)
CO2: 23 meq/L (ref 19–32)
Calcium: 9.2 mg/dL (ref 8.4–10.5)
Chloride: 106 mEq/L (ref 96–112)
Creatinine, Ser: 0.61 mg/dL (ref 0.50–1.10)
GFR calc Af Amer: >90 mL/min (ref >90)
GFR, EST NON AFRICAN AMERICAN: 86 mL/min — AB (ref >90)
Glucose, Bld: 106 mg/dL — ABNORMAL HIGH (ref 70–99)
Potassium: 3.7 mEq/L (ref 3.7–5.3)
Sodium: 143 mEq/L (ref 137–147)

## 2014-01-20 LAB — GLUCOSE, CAPILLARY
GLUCOSE-CAPILLARY: 109 mg/dL — AB (ref 70–99)
Glucose-Capillary: 94 mg/dL (ref 70–99)
Glucose-Capillary: 86 mg/dL (ref 70–99)
Glucose-Capillary: 98 mg/dL (ref 70–99)
Glucose-Capillary: 96 mg/dL (ref 70–99)
Glucose-Capillary: 100 mg/dL — ABNORMAL HIGH (ref 70–99)

## 2014-01-20 LAB — TROPONIN I
TROPONIN I: 0.3 ng/mL — AB (ref <0.30)
TROPONIN I: 0.37 ng/mL — AB (ref <0.30)

## 2014-01-20 LAB — MAGNESIUM: MAGNESIUM: 1.8 mg/dL (ref 1.5–2.5)

## 2014-01-20 LAB — VALPROIC ACID LEVEL: Valproic Acid Lvl: 103.8 ug/mL — ABNORMAL HIGH (ref 50.0–100.0)

## 2014-01-20 MED ORDER — ASPIRIN 81 MG PO CHEW
81.0000 mg | CHEWABLE_TABLET | Freq: Every day | ORAL | Status: DC
  Administered 2014-01-20 – 2014-01-26 (×7): 81 mg
  Filled 2014-01-20 (×9): qty 1

## 2014-01-20 MED ORDER — AMIODARONE HCL 200 MG PO TABS
400.0000 mg | ORAL_TABLET | Freq: Two times a day (BID) | ORAL | Status: DC
  Administered 2014-01-20 – 2014-01-21 (×3): 400 mg via ORAL
  Filled 2014-01-20 (×4): qty 2

## 2014-01-20 MED ORDER — NICARDIPINE HCL IN NACL 20-0.86 MG/200ML-% IV SOLN
0.0000 mg/h | INTRAVENOUS | Status: DC
  Administered 2014-01-21: 3 mg/h via INTRAVENOUS

## 2014-01-20 MED ORDER — METOPROLOL TARTRATE 1 MG/ML IV SOLN
INTRAVENOUS | Status: AC
Start: 2014-01-20 — End: 2014-01-20
  Filled 2014-01-20: qty 5

## 2014-01-20 MED ORDER — METOPROLOL TARTRATE 1 MG/ML IV SOLN
2.5000 mg | INTRAVENOUS | Status: DC | PRN
  Administered 2014-01-20 (×2): 2.5 mg via INTRAVENOUS
  Administered 2014-01-20: 5 mg via INTRAVENOUS
  Filled 2014-01-20 (×2): qty 5

## 2014-01-20 MED ORDER — PHENYLEPHRINE HCL 10 MG/ML IJ SOLN
0.0000 ug/min | INTRAVENOUS | Status: DC
  Filled 2014-01-20: qty 1

## 2014-01-20 MED ORDER — DIGOXIN 0.25 MG/ML IJ SOLN: 0.5000 mg | Freq: Once | INTRAMUSCULAR | Status: DC

## 2014-01-20 MED ORDER — SODIUM CHLORIDE 0.9 % IV SOLN
2000.0000 mg | Freq: Two times a day (BID) | INTRAVENOUS | Status: DC
  Administered 2014-01-20 – 2014-01-26 (×13): 2000 mg via INTRAVENOUS
  Filled 2014-01-20 (×15): qty 20

## 2014-01-20 MED ORDER — SODIUM CHLORIDE 0.9 % IV BOLUS (SEPSIS)
500.0000 mL | INTRAVENOUS | Status: AC
  Administered 2014-01-20: 500 mL via INTRAVENOUS

## 2014-01-20 NOTE — Progress Notes (Addendum)
Around 1630 noticed pt to have twitching in the left upper arm. Will continue to watch. Pt received keppra dose at this time. Seizure activty lasted from 1630-1650

## 2014-01-20 NOTE — Progress Notes (Signed)
PULMONARY / CRITICAL CARE MEDICINE   Name: Jacqueline StablerBarbara F Cline MRN: [id number] DOB: 04/13/1935    ADMISSION DATE:  01/31/2014  REFERRING MD :  EDP  PRIMARY SERVICE: PCCM   CHIEF COMPLAINT:  CVA w AMS / Resp Fx  BRIEF PATIENT DESCRIPTION: 79 y/o F with recent R carotid stent placement who [facility]10 with AMS & Seizures.  Found to have R MCA CVA, seizures, and acute respiratory failure.    SIGNIFICANT EVENTS/STUDIES:  4/10 - admit with R MCA CVA, seizures, resp failure, intubated 4/10 Neuro Consult: ASA, Keppra initiated. MRI, EEG, Echo ordered 4/10 CT of head/neck:  acute moderate R MCA CVA without hemorrhagic conversion, remote R basal gaglia & right temporoccipital infarct, severe white matter changes. Neck without acute fracture. 4/10 EEG: This is an abnormal EEG due to general background slowing. Also noted over the right hemisphere is frequent epileptiform activity with phase reversal at T4. This is consistent with the patient's history of new onset seizure and acute right MCA infarct. No evidence of electrographic status epilepticus was noted.  4/10 MRI brain: Large area of altered signal intensity involving the right frontal -parietal -occipital lobe and posterior right temporal lobe as well as left frontal and parietal lobe which demonstrates patchy enhancement. Small area of blood breakdown products right occipital lobe. Appearance is suggestive of the presence of subacute infarcts. Altered signal intensity mid to anterior right temporal lobe, operculum region and subinsular region as well as involving the posterior right thalamus suggestive of acute infarct (infection such as herpes felt to be less likely consideration given the clinical setting). Remote right coronal radiata/centrum semiovale and caudate head infarct with dilated right lateral ventricle and wallerian degeneration. ADDENDUM: Images reviewed with Dr. Anne HahnWillis. The patient has had episode of hypertension and  seizures. The areas of altered signal intensity and enhancement involving the right frontal -temporal -occipital lobe and the posterior right temporal lobe as well as the left frontal and parietal lobe may therefore represent result of posterior reversible encephalopathy syndrome/hypertensive encephalopathy (hyper perfusion post carotid endarterectomy). This may have a reversible component. Presently only small areas hemorrhage is noted in the right occipital aspect. 4/10 MRA head/neck: Occluded left internal carotid artery. Reconstitution of flow at the left carotid terminus is not adequate as there is poor flow seen within the left middle cerebral artery branches and at the level of the left carotid terminus. Ectatic right internal carotid artery cavernous and supra clinoid segment. Middle cerebral artery branch vessel mild irregularity. Moderate narrowing left vertebral artery. Mild irregularity right vertebral artery. Non visualization right posterior inferior cerebellar artery and left anterior inferior cerebellar artery. Mild narrowing and irregularity of the basilar artery which is ectatic. Mild to moderate narrowing and right posterior cerebral artery proximal to terminal branching. 4/11 EEG: This is an abnormal EEG due to clinical and electrographic seizure activity emanating from the right temporal lobe.  4/11 Echocardiogram: LVEF 60-65%. Mild LVH. Grade 2 diastolic dysfunction. Moderate MR. PASP est 51 mmHg 4/12 Overnight EEG: This is an abnormal EEG. Although burst-suppression activity is noted there is concern that the prolonged burst activity seen may represent nonclinical seizure activity. Clinical correlation recommended.  4/13 Overnight EEG: This is an abnormal study. There are near continuous right hemisphere sharps with a temporal and parietal maxima, indicating a partial seizure disorder with seizures arising from the right temporal lobe and frequent electrographic seizures, from a  predominantly right temporal origin.There is also diffuse slowing and disorganization on the  left hemisphere, indicative of a superimposed severe encephalopathy, of nonspecific etiology, consider ictal encephalopathy, toxic, metabolic or drug induced etiologies.  4/14 AFRVR > PRN metoprolol, digoxin. Loaded with amiodarone enterally due to limited IV access 4/14 EEG: burst suppression pattern. Propofol coma continued   LINES / TUBES: ETT 4/10 >>   CULTURES:   ANTIBIOTICS:   INTERVAL HX:   Unresponsive on propofol  VITAL SIGNS: Temp:  [98.1 F (36.7 C)-99.4 F (37.4 C)] 99.3 F (37.4 C) (04/14 0700) Pulse Rate:  [63-149] 149 (04/14 1000) Resp:  [18-40] 23 (04/14 1000) BP: (90-193)/(38-91) 124/90 mmHg (04/14 1000) SpO2:  [98 %-100 %] 100 % (04/14 1000) FiO2 (%):  [30 %] 30 % (04/14 1000) Weight:  [61.4 kg (135 lb 5.8 oz)] 61.4 kg (135 lb 5.8 oz) (04/14 0405)  HEMODYNAMICS:    VENTILATOR SETTINGS: Vent Mode:  [-] PRVC FiO2 (%):  [30 %] 30 % Set Rate:  [18 bmp] 18 bmp Vt Set:  [460 mL] 460 mL PEEP:  [5 cmH20] 5 cmH20 Plateau Pressure:  [13 cmH20-16 cmH20] 13 cmH20  INTAKE / OUTPUT: Intake/Output     04/13 0701 - 04/14 0700 04/14 0701 - 04/15 0700   I.V. (mL/kg) 2112.8 (34.4) 172.9 (2.8)   NG/GT 840 70   IV Piggyback 750    Total Intake(mL/kg) 3702.8 (60.3) 242.9 (4)   Urine (mL/kg/hr) 1150 (0.8) 125 (0.6)   Total Output 1150 125   Net +2552.8 +117.9        Urine Occurrence 1 x      PHYSICAL EXAMINATION: General: Unresponsive, NAD Neuro: Occasioanl cough, comatose HEENT:  Healing R carotid endarterectomy scar, otherwise WNL Cardiovascular: IRIR, tachy, no M Lungs: clear anteriorly Abdomen:  Round/soft, bsx4 active  Ext: warm without edema  LABS: I have reviewed all of today's lab results. Relevant abnormalities are discussed in the A/P section  CXR: NSC bibasilar opacities - atx and/or effusions  ASSESSMENT / PLAN:  NEUROLOGIC A:   Post carotid  endarterectomy hyperperfusion syndrome Status epilepticus  Acute Encephalopathy  Suspect poor prognosis P:   Further eval and mgmt per Neuro  PULMONARY A: Acute Respiratory Failure due to AMS P:   Cont full vent support - settings reviewed and/or adjusted Cont vent bundle Daily SBT if/when meets criteria  CARDIOVASCULAR A:  Mild Elevation Troponin, no indication for further eval presently H/O CAD HTN Emergency Carotid Occlusion (L) Carotid endarterectomy (R, on 3/31) AFRVR - new onset 4/14 P:  Cont ASA DNR in event arrest  BP control - goal SBP 100-140 mmHg per Dr Pearlean BrownieSethi Digoxin X 1 Cont PRN metoprolol for rate control Load with amiodarone Follow EKG  RENAL A:   Acute Kidney Injury, resolved P:   Monitor BMET intermittently Monitor I/Os Correct electrolytes as indicated  GASTROINTESTINAL A:   No issues P:   SUP: enteral famotidine Cont TFs  HEMATOLOGIC A:   No issues P:  DVT Px: SQ heparin Monitor CBC intermittently  INFECTIOUS A:   No evidence of acute infectious process P:   monitor fever curve/WBC Cont to withold abx for now  ENDOCRINE A:   No acute issues   P:   Monitor BMP glucose trends  GLOBAL:  Discussed with Dr Pearlean BrownieSethi. Cont propofol coma at least 24 hrs more.   I have personally obtained a history, examined the patient, evaluated laboratory and imaging results, formulated the assessment and plan and placed orders.  CRITICAL CARE: The patient is critically ill with multiple organ systems failure and requires  high complexity decision making for assessment and support, frequent evaluation and titration of therapies, application of advanced monitoring technologies and extensive interpretation of multiple databases. Critical Care Time devoted to patient care services described in this note is 40 minutes.   Billy Fischer, MD ; [facility] [phone].  After 5:30 PM or weekends, call [phone]

## 2014-01-20 NOTE — Progress Notes (Signed)
CRITICAL VALUE ALERT  Critical value received:  Trop 0.30  Date of notification:  01/20/14 Time of notification:  0400  Critical value read back:yes  Nurse who received alert:  Philis NettleLisa Acelynn Dejonge  MD notified (1st page):  Dr. Darrick Pennaeterding  Time of first page: 0410 MD notified (2nd page):  Time of second page:  Responding MD:  Dr. Darrick Pennaeterding  Time MD responded:  [phone]

## 2014-01-20 NOTE — Progress Notes (Signed)
Pt went into AFIB last night and continues to remain in Afib this am. Upon assessment noticed the pt appeared to be going in and out of A flutter as well. Consulted with other nurses on the unit and also [facility] for her opinion. She feels it continues to maintain A FIB as underlying rhythm and is having episodes that appear to go in and out of A flutter. Treatment isnt much different will continue to use lopressor for rate control and notify MD when rounding.

## 2014-01-20 NOTE — Progress Notes (Signed)
LTM EEG connections checked and still running.

## 2014-01-20 NOTE — Progress Notes (Signed)
Stroke Team Progress Note  HISTORY   Jacqueline Cline is an 79 y.o. female with known left complete carotid occlusion who recently underwent a right carotid stent on 01/06/14. Per son she ws doing very well, BP controled and lived alone. Her son had called patient at 8 PM last night and she held a normal conversation. He came to pick her up for a Dr. Appointment today at 12:30 and found her face down on the floor with left arm and leg jerking. He states she was awake and able to speak to him. She made mention she had fallen at 8 am this morning. On arrival patient had witnessed left sided seizure, BP 200/100 and was given 2.5 mg Versed. After versed administered she was noted to have airway compromise and intubated. Per note patient then demonstrated right sided seizure activity and was given 2 mg Ativan. Currently she is intubated and sedated on Propofol. She follows no commands and shows no sign of clinical seizure.  Date last known well: Date: 01/15/2014  Time last known well: Time: 20:00  tPA Given: No: out of window   SUBJECTIVE No family members present. The patient remains intubated and sedated. Continuous EEG in progress. Seizure activity continues. Dr. Pearlean Brownie spoke with the patient's son by phone.  OBJECTIVE Most recent Vital Signs: Filed Vitals:   01/20/14 0405 01/20/14 0500 01/20/14 0600 01/20/14 0700  BP:  103/54 107/54 94/49  Pulse:  110 130 124  Temp:    99.3 F (37.4 C)  TempSrc:    Oral  Resp:  18 18 18   Height:      Weight: 135 lb 5.8 oz (61.4 kg)     SpO2:  100% 100% 99%   CBG (last 3)   Recent Labs  01/20/14 0010 01/20/14 0343 01/20/14 0721  GLUCAP 86 98 94    IV Fluid Intake:   . sodium chloride 75 mL/hr at 01/20/14 0500  . midazolam (VERSED) infusion Stopped (01/25/2014 1534)  . propofol 40 mcg/kg/min (01/20/14 0256)    MEDICATIONS  . antiseptic oral rinse  15 mL Mouth Rinse QID  . chlorhexidine  15 mL Mouth Rinse BID  . famotidine  20 mg Per Tube QHS  .  feeding supplement (VITAL HIGH PROTEIN)  1,000 mL Per Tube Q24H  . heparin  5,000 Units Subcutaneous 3 times per day  . lacosamide (VIMPAT) IV  200 mg Intravenous Q12H  . levETIRAcetam  1,000 mg Intravenous 3 times per day  . multivitamin  5 mL Oral Daily  . valproic acid (DEPACON) IVPB  1,000 mg Intravenous 3 times per day   PRN:  sodium chloride, albuterol, etomidate, metoprolol, rocuronium  Diet:     Activity:  Bedrest DVT Prophylaxis:  SQ heparin  CLINICALLY SIGNIFICANT STUDIES Basic Metabolic Panel:   Recent Labs Lab 01/18/14 0304 01/19/14 0429 01/20/14 0250  NA 141 144 143  K 3.5* 3.6* 3.7  CL 105 106 106  CO2 24 23 23   GLUCOSE 102* 91 106*  BUN 15 17 24*  CREATININE 0.61 0.61 0.61  CALCIUM 9.2 9.5 9.2  MG 1.7  --  1.8  PHOS 1.9*  --   --    Liver Function Tests:   Recent Labs Lab 02/01/2014 1402  AST 170*  ALT 172*  ALKPHOS 74  BILITOT 0.4  PROT 7.1  ALBUMIN 3.5   CBC:  Recent Labs Lab 01/21/2014 1402  01/19/14 0429 01/20/14 0250  WBC 18.9*  < > 9.2 8.1  NEUTROABS 16.7*  --   --   --  HGB 14.0  < > 13.0 12.2  HCT 42.2  < > 38.3 36.3  MCV 91.3  < > 88.7 88.8  PLT 247  < > 187 179  < > = values in this interval not displayed. Coagulation:   Recent Labs Lab 01/14/2014 1402  LABPROT 12.2  INR 0.92   Cardiac Enzymes:   Recent Labs Lab 01/07/2014 1402 01/09/2014 2215 01/17/14 0341 01/20/14 0250  CKTOTAL 242*  --   --   --   TROPONINI  --  1.02* 0.64* 0.30*   Urinalysis:   Recent Labs Lab 01/22/2014 1405  COLORURINE YELLOW  LABSPEC 1.022  PHURINE 5.5  GLUCOSEU NEGATIVE  HGBUR NEGATIVE  BILIRUBINUR NEGATIVE  KETONESUR NEGATIVE  PROTEINUR 30*  UROBILINOGEN 0.2  NITRITE NEGATIVE  LEUKOCYTESUR NEGATIVE   Lipid Panel    Component Value Date/Time   CHOL 263* 03/10/2013 0916   TRIG 93 01/19/2014 1615   HDL 48.50 03/10/2013 0916   CHOLHDL 5 03/10/2013 0916   VLDL 25.2 03/10/2013 0916   LDLCALC 106* 07/21/2010 0908   HgbA1C  No results found  for this basename: HGBA1C    Urine Drug Screen:     Component Value Date/Time   LABOPIA NONE DETECTED 02/03/2014 1405   COCAINSCRNUR NONE DETECTED 01/25/2014 1405   LABBENZ POSITIVE* 02/02/2014 1405   AMPHETMU NONE DETECTED 01/25/2014 1405   THCU NONE DETECTED 01/11/2014 1405   LABBARB NONE DETECTED 01/18/2014 1405    Alcohol Level:   Recent Labs Lab 01/10/2014 1402  ETH <11    Ct Head Wo Contrast 01/19/2014    1. Stable bilateral infarcts, right greater than left. These involve the right temporal lobe, parietal lobe, occipital lobe, and to lesser extent the frontal lobe. 2. Extensive subcortical edema, right greater than left is similar to the prior study. 3. Focal infarct of the right thalamus.      Dg Chest Port 1 View  01/19/2014   CLINICAL DATA:  Pneumonia  EXAM: PORTABLE CHEST - 1 VIEW  COMPARISON:  DG CHEST 1V PORT dated 01/17/2014  FINDINGS: Endotracheal and NG tubes are stable. Hazy opacity at the left base is stable. It is increased at the right base. This is compatible with a combination of pleural fluid and pulmonary parenchymal disease. No pneumothorax.  IMPRESSION: Bibasilar opacity is stable on the left and increased on the right.   Electronically Signed   By: Maryclare BeanArt  Hoss M.D.   On: 01/19/2014 07:49    CT of the brain   IMPRESSION:  CT head: Acute moderate right middle cerebral artery territory  infarct, involving the right temporal lobe. No hemorrhagic  conversion.  Remote right basal ganglia/corona radiata and remote right  temporoccipital infarcts, the latter of which appears slightly  larger though this could be technical. Moderate to severe white  matter changes suggest chronic small vessel ischemic disease.  CT cervical spine: No acute cervical spine fracture. Grade 1 C4-5  anterolisthesis on degenerative basis.  Postoperative changes of the right carotid space suggests recent  carotid endarterectomy. In addition, calcific atherosclerosis of the  origin left  internal carotid artery, with central hypodensity which  may be seen with occlusion.  MRI of the brain   IMPRESSION:  MRI HEAD :  Occluded left internal carotid artery.  Recent right carotid endarterectomy.  Subacute enhancing large infarcts involving right frontal -parietal  -occipital lobe and posterior right temporal lobe as well as left  frontal and parietal lobe. Small area of blood breakdown products  right  occipital lobe.  Acute infarcts involving mid to anterior right temporal lobe,  operculum region and subinsular region as well as involving the  posterior right thalamus suggestive of acute infarct (infection such  as herpes felt to be less likely consideration given the clinical  setting).  Remote right coronal radiata/centrum semiovale and caudate head  infarct with dilated right lateral ventricle and wallerian  degeneration.  MRA of the brain   Occluded left internal carotid artery. Reconstitution of flow at the  left carotid terminus is not adequate as there is poor flow seen  within the left middle cerebral artery branches and at the level of  the left carotid terminus.  Ectatic right internal carotid artery cavernous and supra clinoid  segment.  Middle cerebral artery branch vessel mild irregularity.  Moderate narrowing left vertebral artery. Mild irregularity right  vertebral artery.  Non visualization right posterior inferior cerebellar artery and  left anterior inferior cerebellar artery.  Mild narrowing and irregularity of the basilar artery which is  ectatic.  Mild to moderate narrowing and right posterior cerebral artery  proximal to terminal branching.   MRI NECK:  Left internal carotid artery is occluded.  Post right carotid endarterectomy. No high-grade stenosis although  there is mild narrowing at the distal anastomotic site.  Right vertebral artery is dominant. Moderate narrowing of the origin  of the vertebral arteries bilaterally greater on the  right.   2D Echocardiogram   Study Conclusions  - Left ventricle: The cavity size was normal. There was mild concentric hypertrophy. Systolic function was normal. The estimated ejection fraction was in the range of 60% to 65%. Wall motion was normal; there were no regional wall motion abnormalities. Features are consistent with a pseudonormal left ventricular filling pattern, with concomitant abnormal relaxation and increased filling pressure (grade 2 diastolic dysfunction). Doppler parameters are consistent with elevated ventricular end-diastolic filling pressure. - Aortic valve: Cusp separation was severely reduced. There was mild stenosis. Valve area: 1.86cm^2(VTI). Valve area: 1.69cm^2 (Vmax). - Mitral valve: Calcified annulus. Mildly thickened, moderately calcified leaflets . Moderate regurgitation directed posteriorly. - Left atrium: The atrium was mildly dilated. - Tricuspid valve: Moderate regurgitation. - Pulmonary arteries: Systolic pressure was moderately increased.  Carotid Doppler    CXR   IMPRESSION:  1. Marked improvement in interstitial pulmonary edema. Small  residual left effusion.  2. Endotracheal tube tip projects 5.7 cm above the carina.  Orogastric tube tip projects in the distal esophagus. Support  apparatus is felt to be unchanged allowing for differences in  patient positioning.  EKG   Normal sinus rhythm Possible Left atrial enlargement Nonspecific ST abnormality Abnormal ECG  Therapy Recommendations Pending  Physical Exam  General: The patient is comatose, intubated at the time of examination  Respiratory: Lung fields are clear  Cardiovascular: Regular rate and rhythm, no obvious murmurs or rubs are noted.  Abdomen: Soft, nontender, minimal bowel sounds  Skin: No significant peripheral edema is noted.   Neurologic Exam  Mental status: The patient comatose, intubated. No response to sternal rub is noted.  Cranial nerves: Facial  symmetry is present.No  jerking of the head to the left is seen. Eyes are mid position, pupils are equal, trace reactive to light.  Motor: The patient has no voluntary movements of the extremities.  Sensory examination: Patient is decerebrate bilaterally with sternal rub.  Coordination: The patient could not follow commands for cerebellar testing.  Gait and station: The gait could not be tested.  Reflexes: Deep tendon reflexes are  symmetric. Toes are neutral bilaterally.    ASSESSMENT Ms. Jacqueline Cline is a 79 y.o. female presenting with left focal seizures, comatose state. MRI the brain is suggestive of a right thalamic DWI abnormality, large right parietal, temporal, and occipital DWI positive lesions with acute and subacute components with some involvement in the left posterior regions as well. MRA shows occlusion that is known of the left internal carotid artery, the right internal carotid artery is patent. The patient was on low-dose aspirin prior to admission.   Focal seizures, probable status epilepticus due to hyperperfusion syndrome following recent right CEA  Right greater left brain DWI positive lesions by MRI likley PRES versus postictal abnormalities doubt strokes  Known left internal carotid artery occlusion, right carotid endarterectomy  Hypertension  Dyslipidemia  Atrial fibrillation with a rapid ventricular response apparently started during the night.   Hospital day # 4  Per Dr Anne Hahn 01/19/2014 -  I have reviewed the MRI brain study is Dr. Constance Goltz. 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 This is the likely generator for the seizures, with projection to the right temporal lobe. Mild diffusion weighted changes are seen by MRI associated with the right temporal lobe secondary to status epilepticus. Prominent cortical changes seen posteriorly in the right greater than left hemispheres with  associated enhancement is related to hyperperfusion, PRES syndrome. Status epilepticus associated with thalamic ischemia can be extremely difficult to control. EEG today shows ongoing sharp wave activity with episodes of suppression. Prognosis in this case is guarded. The patient continues to have seizures ongoing, head jerking to the left, ongoing sharp wave activity on EEG study. I am concerned that the seizures cannot be fully controlled, prognosis is poor. Examination now shows decerebration bilaterally.  TREATMENT/PLAN  Rectal aspirin has been held  Continue Vimpat and Depacon. Increase Keppra to 2 g twice daily.  Supportive care, critical care medicine involved  Continue EEG monitoring to keep the patient in propofol coma with burst  Suppression for 24 hours more  Follow clinical course  May need medication for atrial fibrillation rate control.  Per Dr. Pearlean Brownie systolic blood pressure goal 100-130 mmHg. MAP > 70. - 500 cc normal saline bolus given for hypotension.  Dr. Pearlean Brownie updated the patient's son by telephone today.  Discussed with Dr. Bard Herbert critical care  Delton See PA-C [facility] Pager [phone] 01/20/2014, 8:01 AM This patient is critically ill and at significant risk of neurological worsening, death and care requires constant monitoring of vital signs, hemodynamics,respiratory and cardiac monitoring,review of multiple databases, neurological assessment, discussion with family, other specialists and medical decision making of high complexity. I spent 40 minutes of neurocritical care time  in the care of  this patient. I have personally examined this patient, reviewed pertinent data, the plan of care in degree with the above Delia Heady, MD

## 2014-01-20 NOTE — Progress Notes (Signed)
eLink Physician-Brief Progress Note Patient Name: Jacqueline StablerBarbara F Rathbun DOB: 10/23/1934 MRN: [id number]  Date of Service  01/20/2014   HPI/Events of Note  Patient now with elevated HR in the 140s with stable BP.  Irregular rhythm suggestive of AF on monitor.  Patient is a limited code but can receive antiarrythmic.   eICU Interventions  Plan: PRN lopressor for now EKG Trop cycled   Intervention Category Intermediate Interventions: Arrhythmia - evaluation and management  Dorise Hisslizabeth C Draco Malczewski 01/20/2014, 1:19 AM

## 2014-01-20 NOTE — Progress Notes (Signed)
NUTRITION FOLLOW-UP  INTERVENTION:  Vital High Protein @ 35 ml/hr  MVI daily  Tube feeding regimen provides 840 kcal, 73 grams of protein, and 702 ml of H2O.  TF regimen and current propofol rate provides 1185 total kcal (104% of kcal needs)  NUTRITION DIAGNOSIS: Inadequate oral intake related to inability to eat as evidenced by NPO status; ongoing.   Goal: Pt to meet >/= 90% of their estimated nutrition needs, met   Monitor:  Vent status, TF tolerance  ASSESSMENT: Pt admitted with R MCA CVA, seizures, and respiratory failure.  Patient is currently intubated on ventilator support MV: 8.1 L/min Temp (24hrs), Avg:98.7 F (37.1 C), Min:98.1 F (36.7 C), Max:99.4 F (37.4 C)  Propofol: 13.1 ml/hr provides 345 kcal from lipid per day  Pt discussed during ICU rounds and with RN.  Pt with abnormal EEG 4/14, ongoing seizures. Remains in propofol coma.   Height: Ht Readings from Last 1 Encounters:  01/21/2014 _0  (1.626 m)    Weight: Wt Readings from Last 1 Encounters:  01/20/14 135 lb 5.8 oz (61.4 kg)  Admission weight 120 lb (54.7 kg)   BMI:  Body mass index is 23.22 kg/(m^2).  Estimated Nutritional Needs: Kcal: 1138 Protein: 65-75 grams Fluid: > 1.5 L/day  Skin: right neck incision  Diet Order:     Intake/Output Summary (Last 24 hours) at 01/20/14 1107 Last data filed at 01/20/14 1000  Gross per 24 hour  Intake 3574.7 ml  Output   1115 ml  Net 2459.7 ml    Last BM: PTA   Labs:   Recent Labs Lab 01/18/14 0304 01/19/14 0429 01/20/14 0250  NA 141 144 143  K 3.5* 3.6* 3.7  CL 105 106 106  CO2 _1 BUN 15 17 24*  CREATININE 0.61 0.61 0.61  CALCIUM 9.2 9.5 9.2  MG 1.7  --  1.8  PHOS 1.9*  --   --   GLUCOSE 102* 91 106*    CBG (last 3)   Recent Labs  01/20/14 0010 01/20/14 0343 01/20/14 0721  GLUCAP 86 98 94    Scheduled Meds: . amiodarone  400 mg Oral BID  . antiseptic oral rinse  15 mL Mouth Rinse QID  . aspirin  81 mg Per  Tube Daily  . chlorhexidine  15 mL Mouth Rinse BID  . digoxin  0.5 mg Intravenous Once  . famotidine  20 mg Per Tube QHS  . feeding supplement (VITAL HIGH PROTEIN)  1,000 mL Per Tube Q24H  . heparin  5,000 Units Subcutaneous 3 times per day  . lacosamide (VIMPAT) IV  200 mg Intravenous Q12H  . levETIRAcetam  1,000 mg Intravenous 3 times per day  . multivitamin  5 mL Oral Daily  . valproic acid (DEPACON) IVPB  1,000 mg Intravenous 3 times per day    Continuous Infusions: . sodium chloride 75 mL/hr at 01/20/14 0500  . phenylephrine (NEO-SYNEPHRINE) Adult infusion    . propofol 30 mcg/kg/min (01/20/14 1000)   [place], LDN, CNSC [phone] Pager [phone] After Hours Pager

## 2014-01-20 NOTE — Progress Notes (Signed)
Upon Neurology rounding MD stated that symptomatically should be treating PRES and that BP needs to be under tight control. MD would like to keep the SBP >100 or <130 or MAP >70. BP low 90/49 at the moment will give 500cc bolus per MD order. Will continue to montior

## 2014-01-21 ENCOUNTER — Inpatient Hospital Stay (HOSPITAL_COMMUNITY): Payer: Medicare Other

## 2014-01-21 ENCOUNTER — Encounter (HOSPITAL_COMMUNITY): Payer: Self-pay | Admitting: *Deleted

## 2014-01-21 LAB — COMPREHENSIVE METABOLIC PANEL
ALK PHOS: 51 U/L (ref 39–117)
ALT: 34 U/L (ref 0–35)
AST: 29 U/L (ref 0–37)
Albumin: 1.9 g/dL — ABNORMAL LOW (ref 3.5–5.2)
BUN: 32 mg/dL — ABNORMAL HIGH (ref 6–23)
CALCIUM: 8.8 mg/dL (ref 8.4–10.5)
CHLORIDE: 108 meq/L (ref 96–112)
CO2: 21 meq/L (ref 19–32)
Creatinine, Ser: 0.65 mg/dL (ref 0.50–1.10)
GFR calc Af Amer: >90 mL/min (ref >90)
GFR, EST NON AFRICAN AMERICAN: 84 mL/min — AB (ref >90)
Glucose, Bld: 116 mg/dL — ABNORMAL HIGH (ref 70–99)
POTASSIUM: 3.8 meq/L (ref 3.7–5.3)
SODIUM: 144 meq/L (ref 137–147)
Total Bilirubin: 0.3 mg/dL (ref 0.3–1.2)
Total Protein: 5 g/dL — ABNORMAL LOW (ref 6.0–8.3)

## 2014-01-21 LAB — GLUCOSE, CAPILLARY
GLUCOSE-CAPILLARY: 109 mg/dL — AB (ref 70–99)
GLUCOSE-CAPILLARY: 92 mg/dL (ref 70–99)
GLUCOSE-CAPILLARY: 105 mg/dL — AB (ref 70–99)
GLUCOSE-CAPILLARY: 100 mg/dL — AB (ref 70–99)
Glucose-Capillary: 100 mg/dL — ABNORMAL HIGH (ref 70–99)
Glucose-Capillary: 109 mg/dL — ABNORMAL HIGH (ref 70–99)
Glucose-Capillary: 113 mg/dL — ABNORMAL HIGH (ref 70–99)

## 2014-01-21 MED ORDER — LABETALOL HCL 100 MG PO TABS
100.0000 mg | ORAL_TABLET | Freq: Three times a day (TID) | ORAL | Status: DC
  Administered 2014-01-21: 100 mg
  Filled 2014-01-21 (×5): qty 1

## 2014-01-21 MED ORDER — SODIUM CHLORIDE 0.9 % IV SOLN
12.0000 mg/h | INTRAVENOUS | Status: DC
  Administered 2014-01-21: 12 mg/h via INTRAVENOUS
  Filled 2014-01-21 (×2): qty 10

## 2014-01-21 MED ORDER — FREE WATER
100.0000 mL | Freq: Three times a day (TID) | Status: DC
  Administered 2014-01-21 – 2014-01-22 (×3): 100 mL

## 2014-01-21 MED ORDER — MIDAZOLAM BOLUS VIA INFUSION
15.0000 mg | Freq: Once | INTRAVENOUS | Status: AC
  Administered 2014-01-21 (×3): 5 mg via INTRAVENOUS
  Filled 2014-01-21: qty 15

## 2014-01-21 MED ORDER — MIDAZOLAM HCL 5 MG/ML IJ SOLN
24.0000 mg/h | INTRAMUSCULAR | Status: DC
  Administered 2014-01-21 – 2014-01-22 (×4): 24 mg/h via INTRAVENOUS
  Administered 2014-01-23: 3 mg/h via INTRAVENOUS
  Filled 2014-01-21 (×6): qty 20

## 2014-01-21 MED ORDER — AMIODARONE HCL 200 MG PO TABS
400.0000 mg | ORAL_TABLET | Freq: Two times a day (BID) | ORAL | Status: DC
  Administered 2014-01-21 – 2014-01-23 (×5): 400 mg
  Filled 2014-01-21 (×8): qty 2

## 2014-01-21 MED ORDER — FAMOTIDINE 40 MG/5ML PO SUSR
20.0000 mg | Freq: Two times a day (BID) | ORAL | Status: DC
  Administered 2014-01-21 – 2014-01-26 (×10): 20 mg
  Filled 2014-01-21 (×11): qty 2.5

## 2014-01-21 MED ORDER — PROPOFOL 10 MG/ML IV EMUL
INTRAVENOUS | Status: AC
  Filled 2014-01-21: qty 100

## 2014-01-21 MED ORDER — MIDAZOLAM HCL 2 MG/2ML IJ SOLN: 6.0000 mg | Freq: Once | INTRAMUSCULAR | Status: AC

## 2014-01-21 MED ORDER — POLYVINYL ALCOHOL 1.4 % OP SOLN
1.0000 [drp] | OPHTHALMIC | Status: DC | PRN
  Filled 2014-01-21: qty 15

## 2014-01-21 MED ORDER — DEXTROSE 5 % IV SOLN
2.0000 ug/min | INTRAVENOUS | Status: DC
  Administered 2014-01-21: 5 ug/min via INTRAVENOUS
  Administered 2014-01-22: 13 ug/min via INTRAVENOUS
  Administered 2014-01-22: 12.5 ug/min via INTRAVENOUS
  Filled 2014-01-21 (×3): qty 4

## 2014-01-21 MED ORDER — MIDAZOLAM HCL 2 MG/2ML IJ SOLN
INTRAMUSCULAR | Status: AC
  Administered 2014-01-21: 2 mg
  Filled 2014-01-21: qty 6

## 2014-01-21 MED ORDER — PROPOFOL 10 MG/ML IV EMUL
30.0000 ug/kg/min | INTRAVENOUS | Status: DC
  Administered 2014-01-21 – 2014-01-22 (×2): 30 ug/kg/min via INTRAVENOUS
  Filled 2014-01-21: qty 100

## 2014-01-21 MED ORDER — ADULT MULTIVITAMIN LIQUID CH
5.0000 mL | Freq: Every day | ORAL | Status: DC
  Administered 2014-01-22 – 2014-01-26 (×5): 5 mL
  Filled 2014-01-21 (×5): qty 5

## 2014-01-21 MED ORDER — MIDAZOLAM HCL 2 MG/2ML IJ SOLN
INTRAMUSCULAR | Status: AC
  Administered 2014-01-21: 6 mg
  Filled 2014-01-21: qty 6

## 2014-01-21 MED ORDER — MIDAZOLAM BOLUS VIA INFUSION
5.0000 mg | Freq: Once | INTRAVENOUS | Status: AC
  Administered 2014-01-21: 5 mg via INTRAVENOUS

## 2014-01-21 MED ORDER — SODIUM CHLORIDE 0.9 % IV SOLN
12.0000 mg/h | INTRAVENOUS | Status: DC
  Filled 2014-01-21: qty 10

## 2014-01-21 MED ORDER — HYDRALAZINE HCL 20 MG/ML IJ SOLN
10.0000 mg | INTRAMUSCULAR | Status: DC | PRN
  Administered 2014-01-24: 20 mg via INTRAVENOUS
  Filled 2014-01-21: qty 1

## 2014-01-21 MED ORDER — POLYETHYL GLYCOL-PROPYL GLYCOL 0.4-0.3 % OP GEL: 1.0000 [drp] | Freq: Every evening | OPHTHALMIC | Status: DC | PRN

## 2014-01-21 NOTE — Progress Notes (Signed)
Stroke Team Progress Note  HISTORY Jacqueline Cline is an 79 y.o. female with known left complete carotid occlusion who recently underwent a right carotid stent on 01/06/14. Per son she ws doing very well, BP controled and lived alone. Her son had called patient at 8 PM last night  01/15/2014 and she held a normal conversation. He came to pick her up for a Dr. Appointment today 01/23/2014 at 12:30 and found her face down on the floor with left arm and leg jerking. He states she was awake and able to speak to him. She made mention she had fallen at 8 am this morning 01/14/2014. On arrival patient had witnessed left sided seizure, BP 200/100 and was given 2.5 mg Versed. After versed administered she was noted to have airway compromise and intubated. Per note patient then demonstrated right sided seizure activity and was given 2 mg Ativan. She was not a tPA candidate as she was out of window. She was admitted to neuro ICU for further evaluation and treatment.  SUBJECTIVE Family, including son at bedside.   OBJECTIVE Most recent Vital Signs: Filed Vitals:   01/21/14 0700 01/21/14 0748 01/21/14 0749 01/21/14 0800  BP: 143/50 106/40  127/39  Pulse: 78 71  71  Temp:   98.7 F (37.1 C)   TempSrc:   Axillary   Resp: 25 18  18   Height:      Weight:      SpO2: 98% 96%  96%   CBG (last 3)   Recent Labs  01/21/14 0014 01/21/14 0419 01/21/14 0803  GLUCAP 100* 109* 109*    IV Fluid Intake:   . sodium chloride 75 mL/hr at 01/20/14 2000  . niCARDipine    . phenylephrine (NEO-SYNEPHRINE) Adult infusion    . propofol 30 mcg/kg/min (01/21/14 0745)    MEDICATIONS  . amiodarone  400 mg Oral BID  . antiseptic oral rinse  15 mL Mouth Rinse QID  . aspirin  81 mg Per Tube Daily  . chlorhexidine  15 mL Mouth Rinse BID  . famotidine  20 mg Per Tube QHS  . feeding supplement (VITAL HIGH PROTEIN)  1,000 mL Per Tube Q24H  . heparin  5,000 Units Subcutaneous 3 times per day  . lacosamide (VIMPAT) IV  200 mg  Intravenous Q12H  . levETIRAcetam  2,000 mg Intravenous Q12H  . multivitamin  5 mL Oral Daily  . valproic acid (DEPACON) IVPB  1,000 mg Intravenous 3 times per day   PRN:  sodium chloride, albuterol, etomidate, metoprolol  Diet:    NPO Activity:  Bedrest DVT Prophylaxis:  SQ heparin  CLINICALLY SIGNIFICANT STUDIES Basic Metabolic Panel:   Recent Labs Lab 01/18/14 0304  01/20/14 0250 01/21/14 0225  NA 141  < > 143 144  K 3.5*  < > 3.7 3.8  CL 105  < > 106 108  CO2 24  < > 23 21  GLUCOSE 102*  < > 106* 116*  BUN 15  < > 24* 32*  CREATININE 0.61  < > 0.61 0.65  CALCIUM 9.2  < > 9.2 8.8  MG 1.7  --  1.8  --   PHOS 1.9*  --   --   --   < > = values in this interval not displayed. Liver Function Tests:   Recent Labs Lab 01/14/2014 1402 01/21/14 0225  AST 170* 29  ALT 172* 34  ALKPHOS 74 51  BILITOT 0.4 0.3  PROT 7.1 5.0*  ALBUMIN 3.5 1.9*  CBC:  Recent Labs Lab 01/30/2014 1402  01/19/14 0429 01/20/14 0250  WBC 18.9*  < > 9.2 8.1  NEUTROABS 16.7*  --   --   --   HGB 14.0  < > 13.0 12.2  HCT 42.2  < > 38.3 36.3  MCV 91.3  < > 88.7 88.8  PLT 247  < > 187 179  < > = values in this interval not displayed. Coagulation:   Recent Labs Lab 01/13/2014 1402  LABPROT 12.2  INR 0.92   Cardiac Enzymes:   Recent Labs Lab 02/02/2014 1402  01/17/14 0341 01/20/14 0250 01/20/14 0800  CKTOTAL 242*  --   --   --   --   TROPONINI  --   < > 0.64* 0.30* 0.37*  < > = values in this interval not displayed. Urinalysis:   Recent Labs Lab 01/07/2014 1405  COLORURINE YELLOW  LABSPEC 1.022  PHURINE 5.5  GLUCOSEU NEGATIVE  HGBUR NEGATIVE  BILIRUBINUR NEGATIVE  KETONESUR NEGATIVE  PROTEINUR 30*  UROBILINOGEN 0.2  NITRITE NEGATIVE  LEUKOCYTESUR NEGATIVE   Lipid Panel    Component Value Date/Time   CHOL 263* 03/10/2013 0916   TRIG 93 01/19/2014 1615   HDL 48.50 03/10/2013 0916   CHOLHDL 5 03/10/2013 0916   VLDL 25.2 03/10/2013 0916   LDLCALC 106* 07/21/2010 0908   HgbA1C   No results found for this basename: HGBA1C    Urine Drug Screen:     Component Value Date/Time   LABOPIA NONE DETECTED 01/15/2014 1405   COCAINSCRNUR NONE DETECTED 01/22/2014 1405   LABBENZ POSITIVE* 01/19/2014 1405   AMPHETMU NONE DETECTED 01/22/2014 1405   THCU NONE DETECTED 01/12/2014 1405   LABBARB NONE DETECTED 02/02/2014 1405    Alcohol Level:   Recent Labs Lab 01/15/2014 1402  ETH <11    CT Head Wo Contrast 01/19/2014  1. Stable bilateral infarcts, right greater than left. These involve the right temporal lobe, parietal lobe, occipital lobe, and to lesser extent the frontal lobe. 2. Extensive subcortical edema, right greater than left is similar to the prior study. 3. Focal infarct of the right thalamus.    01/12/2014 Acute moderate right middle cerebral artery territory infarct, involving the right temporal lobe. No hemorrhagic conversion.  Remote right basal ganglia/corona radiata and remote right temporoccipital infarcts, the latter of which appears slightly larger though this could be technical. Moderate to severe white matter changes suggest chronic small vessel ischemic disease.  12/26/2013 1. Chronic right MCA territory ischemia. No acute intracranial abnormality. 2. Extensive calcified atherosclerosis of the ICA siphons.  CT of the spine  01/24/2014 No acute cervical spine fracture. Grade 1 C4-5 anterolisthesis on degenerative basis. Postoperative changes of the right carotid space suggests recent carotid endarterectomy. In addition, calcific atherosclerosis of the  origin left internalcarotid artery, with central hypodensity which may be seen with occlusion.  MRI of the brain  01/17/2014   Occluded left internal carotid artery. Recent right carotid endarterectomy. Subacute enhancing large infarcts involving right frontal -parietal -occipital lobe and posterior right temporal lobe as well as left frontal and parietal lobe. Small area of blood breakdown products right occipital lobe.  Acute infarcts involving mid to anterior right temporal lobe, operculum region and subinsular region as well as involving the posterior right thalamus suggestive of acute infarct (infection such as herpes felt to be less likely consideration given the clinical setting). Remote right coronal radiata/centrum semiovale and caudate head infarct with dilated right lateral ventricle and wallerian degeneration.  MRA  of the brain  01/17/2014 Occluded left internal carotid artery. Reconstitution of flow at the  left carotid terminus is not adequate as there is poor flow seen within the left middle cerebral artery branches and at the level of the left carotid terminus.  Ectatic right internal carotid artery cavernous and supra clinoid segment. Middle cerebral artery branch vessel mild irregularity. Moderate narrowing left vertebral artery. Mild irregularity right vertebral artery. Non visualization right posterior inferior cerebellar artery and left anterior inferior cerebellar artery. Mild narrowing and irregularity of the basilar artery which is ectatic. Mild to moderate narrowing and right posterior cerebral artery proximal to terminal branching.  MRA of the neck 01/17/2014 Left internal carotid artery is occluded. Post right carotid endarterectomy. No high-grade stenosis although there is mild narrowing at the distal anastomotic site.  Right vertebral artery is dominant. Moderate narrowing of the origin of the vertebral arteries bilaterally greater on the right.   2D Echocardiogram  estimated ejection fraction was in the range of 60% to 65%. - Aortic valve: Cusp separation was severely reduced. There was mild stenosis.   Carotid Doppler  See MRA neck  CXR   01/20/2014 Bilateral pleural effusions stable from the previous exam. No new focal abnormality is seen.  01/19/2014   Bibasilar opacity is stable on the left and increased on the right.    01/17/2014 Atelectasis versus infiltrate left lung base and stable small  left effusion. Endotracheal tube unchanged. There has been advancement of the patient's NG tube. 01/17/2014 1. Marked improvement in interstitial pulmonary edema. Small residual left effusion. 2. Endotracheal tube tip projects 5.7 cm above the carina. Orogastric tube tip projects in the distal esophagus. Support apparatus is felt to be unchanged allowing for differences in  patient positioning. October 16, 2013 Acute pulmonary edema. Endotracheal tube well positioned. Nasogastric tube tip in a hiatal hernia, above the level of the  diaphragm.  EKG  Normal sinus rhythm Possible Left atrial enlargement Nonspecific ST abnormality Abnormal ECG  EEG D3 There remains evidence of right hemisphere cortical irritability predominatly temporal and parietal, and underlying cortical destruction in this region, as evidence by the presence of PLEDs. There is no clear evidence of electrographic seizures in this portion of the patient's study.   Therapy Recommendations   Physical Exam General: The patient is comatose, intubated at the time of examination Respiratory: Lung fields are clear Cardiovascular: Regular rate and rhythm, no obvious murmurs or rubs are noted. Abdomen: Soft, nontender, minimal bowel sounds Skin: No significant peripheral edema is noted.  Neurologic Exam Mental status: The patient comatose, intubated. No response to sternal rub is noted. Cranial nerves: Facial symmetry is present.No  jerking of the head to the left is seen. Eyes are mid position, pupils are equal, trace reactive to light. Motor: The patient has no voluntary movements of the extremities. Sensory examination: Patient is decerebrate bilaterally with sternal rub. Coordination: The patient could not follow commands for cerebellar testing. Gait and station: The gait could not be tested. Reflexes: Deep tendon reflexes are symmetric. Toes are neutral bilaterally.  ASSESSMENT Jacqueline Cline is a 79 y.o. female s/p R CEA 3/31 with  known L ICA occlusion presenting with left focal seizures, comatose state. Imaging confirms R > L  diffusion weighted abnormalities consistent with PRES. Difficult to confirm or refute presence of ischemic stroke based on imaging. Also suspect some DWI changes also related to seizure activity. (Will need to repeat imaging in the future).   The patient was on low-dose aspirin  prior to admission. Patient with resultant comatose state.   Status epilepticus   Seizures associated with thalamic ischemia can be extremely difficult to control  On propofol to sedate  On 24h EEG monitoring  On max doses Vimpat and Depacon, Keppra  Hypertension  Dyslipidemia, lipids not drawn this admission, on pravachol 40 mg daily prior to admission  Atrial fibrillation with a rapid ventricular response, rate improved   Hospital day # 5  TREATMENT/PLAN  Continue aspirin 81 mg daily per tube for secondary stroke prevention  Continue 24h EEG monitoring   Attempt to reduce propofol and evaluate for clinical seizure  Given PRES, Systolic blood pressure goal 100-130 mmHg. MAP > 70. SBP goal overshadows MAP goal.  Resume statin at discharge (intollerant to simvastatin which is hospital replacement of pravachol)  Supportive care, critical care medicine involved  D/w son and bedside who is realistic about her prognosis and agrees with cautious withdrawal of propofol and realizes she may not wake up and be left with severe permanent brain damage.  D/w Dr Amada Jupiter neurohospitalist who will assume neurological care from now given persistent electrographic seizures  Jacqueline BIBY, MSN, RN, ANVP-BC, ANP-BC, GNP-BC [facility] Pager: [phone] 01/21/2014 9:05 AM This patient is critically ill and at significant risk of neurological worsening, death and care requires constant monitoring of vital signs, hemodynamics,respiratory and cardiac monitoring,review of multiple databases, neurological  assessment, discussion with family, other specialists and medical decision making of high complexity. I spent 45 minutes of neurocritical care time  in the care of  this patient. I have personally obtained a history, examined the patient, evaluated imaging results, and formulated the assessment and plan of care. I agree with the above. Jacqueline Heady, MD

## 2014-01-21 NOTE — Progress Notes (Signed)
PULMONARY / CRITICAL CARE MEDICINE   Name: Jacqueline StablerBarbara F Mehlhaff MRN: [id number] DOB: 04/13/1935    ADMISSION DATE:  01/31/2014  REFERRING MD :  EDP  PRIMARY SERVICE: PCCM   CHIEF COMPLAINT:  CVA w AMS / Resp Fx  BRIEF PATIENT DESCRIPTION: 79 y/o F with recent R carotid stent placement [facility] with AMS & Seizures.  Found to have R MCA CVA, seizures, and acute respiratory failure.    SIGNIFICANT EVENTS/STUDIES:  4/10 - admit with R MCA CVA, seizures, resp failure, intubated 4/10 Neuro Consult: ASA, Keppra initiated. MRI, EEG, Echo ordered 4/10 CT of head/neck:  acute moderate R MCA CVA without hemorrhagic conversion, remote R basal gaglia & right temporoccipital infarct, severe white matter changes. Neck without acute fracture. 4/10 EEG: This is an abnormal EEG due to general background slowing. Also noted over the right hemisphere is frequent epileptiform activity with phase reversal at T4. This is consistent with the patient's history of new onset seizure and acute right MCA infarct. No evidence of electrographic status epilepticus was noted.  4/10 MRI brain: Large area of altered signal intensity involving the right frontal -parietal -occipital lobe and posterior right temporal lobe as well as left frontal and parietal lobe which demonstrates patchy enhancement. Small area of blood breakdown products right occipital lobe. Appearance is suggestive of the presence of subacute infarcts. Altered signal intensity mid to anterior right temporal lobe, operculum region and subinsular region as well as involving the posterior right thalamus suggestive of acute infarct (infection such as herpes felt to be less likely consideration given the clinical setting). Remote right coronal radiata/centrum semiovale and caudate head infarct with dilated right lateral ventricle and wallerian degeneration. ADDENDUM: Images reviewed with Dr. Anne HahnWillis. The patient has had episode of hypertension and  seizures. The areas of altered signal intensity and enhancement involving the right frontal -temporal -occipital lobe and the posterior right temporal lobe as well as the left frontal and parietal lobe may therefore represent result of posterior reversible encephalopathy syndrome/hypertensive encephalopathy (hyper perfusion post carotid endarterectomy). This may have a reversible component. Presently only small areas hemorrhage is noted in the right occipital aspect. 4/10 MRA head/neck: Occluded left internal carotid artery. Reconstitution of flow at the left carotid terminus is not adequate as there is poor flow seen within the left middle cerebral artery branches and at the level of the left carotid terminus. Ectatic right internal carotid artery cavernous and supra clinoid segment. Middle cerebral artery branch vessel mild irregularity. Moderate narrowing left vertebral artery. Mild irregularity right vertebral artery. Non visualization right posterior inferior cerebellar artery and left anterior inferior cerebellar artery. Mild narrowing and irregularity of the basilar artery which is ectatic. Mild to moderate narrowing and right posterior cerebral artery proximal to terminal branching. 4/11 EEG: This is an abnormal EEG due to clinical and electrographic seizure activity emanating from the right temporal lobe.  4/11 Echocardiogram: LVEF 60-65%. Mild LVH. Grade 2 diastolic dysfunction. Moderate MR. PASP est 51 mmHg 4/12 Overnight EEG: This is an abnormal EEG. Although burst-suppression activity is noted there is concern that the prolonged burst activity seen may represent nonclinical seizure activity. Clinical correlation recommended.  4/13 Overnight EEG: This is an abnormal study. There are near continuous right hemisphere sharps with a temporal and parietal maxima, indicating a partial seizure disorder with seizures arising from the right temporal lobe and frequent electrographic seizures, from a  predominantly right temporal origin.There is also diffuse slowing and disorganization on the  left hemisphere, indicative of a superimposed severe encephalopathy, of nonspecific etiology, consider ictal encephalopathy, toxic, metabolic or drug induced etiologies.  4/14 AFRVR > PRN metoprolol, digoxin. Loaded with amiodarone enterally due to limited IV access 4/14 EEG: burst suppression pattern. Propofol coma continued 4/14 spontaneous conversion back to NSR 4/15 EEG: ongoing pleds 3-10, up to 20 with burst suppression every 1-2 secs. 4/15 Hypertension > nicardipine infusion initiated. Scheduled labetalol initiated. Propofol infusion titration to off initiated   LINES / TUBES: ETT 4/10 >>   CULTURES:   ANTIBIOTICS:   INTERVAL HX:   Unresponsive. No spont movement  VITAL SIGNS: Temp:  [97.9 F (36.6 C)-99.3 F (37.4 C)] 98.7 F (37.1 C) (04/15 0749) Pulse Rate:  [65-85] 83 (04/15 1209) Resp:  [17-25] 18 (04/15 1209) BP: (94-149)/(39-54) 139/49 mmHg (04/15 1209) SpO2:  [94 %-99 %] 98 % (04/15 1209) FiO2 (%):  [30 %] 30 % (04/15 1209) Weight:  [62.1 kg (136 lb 14.5 oz)] 62.1 kg (136 lb 14.5 oz) (04/15 0304)  HEMODYNAMICS:    VENTILATOR SETTINGS: Vent Mode:  [-] PRVC FiO2 (%):  [30 %] 30 % Set Rate:  [18 bmp] 18 bmp Vt Set:  [460 mL] 460 mL PEEP:  [5 cmH20] 5 cmH20 Plateau Pressure:  [14 cmH20-18 cmH20] 15 cmH20  INTAKE / OUTPUT: Intake/Output     04/14 0701 - 04/15 0700 04/15 0701 - 04/16 0700   I.V. (mL/kg) 2052.8 (33.1) 368.6 (5.9)   NG/GT 840 140   IV Piggyback 660    Total Intake(mL/kg) 3552.8 (57.2) 508.6 (8.2)   Urine (mL/kg/hr) 1025 (0.7) 250 (0.8)   Total Output 1025 250   Net +2527.8 +258.6        Urine Occurrence 1 x      PHYSICAL EXAMINATION: General: Unresponsive, NAD Neuro: Occasioanl cough, comatose HEENT:  Healing R carotid endarterectomy scar, otherwise WNL Cardiovascular: IRIR, tachy, no M Lungs: clear anteriorly Abdomen:  Round/soft, bsx4  active  Ext: warm without edema  LABS: I have reviewed all of today's lab results. Relevant abnormalities are discussed in the A/P section  CXR: NNF  EKG: NSR, NSST changes  ASSESSMENT / PLAN:  NEUROLOGIC A:   Post carotid endarterectomy hyperperfusion syndrome Status epilepticus  Acute Encephalopathy  Suspect poor prognosis P:   Further eval and mgmt per Neuro  PULMONARY A: Acute Respiratory Failure due to AMS P:   Cont full vent support - settings reviewed and/or adjusted Cont vent bundle Daily SBT if/when meets criteria  CARDIOVASCULAR A:  Mild Elevation Troponin - demand ischemia, doubt MI H/O CAD HTN Emergency, resolved Carotid Occlusion (L) Carotid endarterectomy (R, on 3/31) AFRVR - new onset 4/14 Labile Htn P:  Cont ASA DNR in event arrest  BP control - goal SBP 100-130 mmHg Cont amiodarone Scheduled labetalol started 4/15 PRN hydralazine Wean nicardipine to off as tolerated maintaining SBP < 130 mmHg  RENAL A:   Acute Kidney Injury, resolved P:   Monitor BMET intermittently Monitor I/Os Correct electrolytes as indicated  GASTROINTESTINAL A:   No issues P:   SUP: enteral famotidine Cont TFs  HEMATOLOGIC A:   No issues P:  DVT Px: SQ heparin Monitor CBC intermittently  INFECTIOUS A:   No evidence of acute infectious process P:   monitor fever curve/WBC Cont to withold abx for now  ENDOCRINE A:   No acute issues   P:   Monitor BMP glucose trends  GLOBAL:  Discussed with Dr Pearlean BrownieSethi.   I have personally obtained a history,  examined the patient, evaluated laboratory and imaging results, formulated the assessment and plan and placed orders.  CRITICAL CARE: The patient is critically ill with multiple organ systems failure and requires high complexity decision making for assessment and support, frequent evaluation and titration of therapies, application of advanced monitoring technologies and extensive interpretation of multiple  databases. Critical Care Time devoted to patient care services described in this note is 30 minutes.   Billy Fischeravid Jahmia Berrett, MD ; [facility] service Mobile [phone].  After 5:30 PM or weekends, call [phone]

## 2014-01-21 NOTE — Progress Notes (Addendum)
REviewed EEG, frequent discharges concerning for subclinical seizures with persistent PLEDs-Plus pattern on EEG.   Left foot has clonic activity with each PLED supporting an ictal nature to these discharges.  At this point, I feel that the next step in management would be barbiturate coma. In a 79 year old this carries significant risk and would almost certainly necessitate long term support(e.g trach and PEG).   I discussed this with the son who is currently thinking about whether to continue aggressive care.   In the interim, she has been on high dose propofol for multiple days, will change to versed and attempt to titrate to burst suppression, though would favor barbiturate coma if aggressive care is desired as it can be better at achieving burst suppression in refractory cases.    Versed has caused a drop in blood pressure, have asked for CCM assistance due to hypotension. Would prefer SBP goal 100 - 130, MAP  > 70 if possible.   Ritta SlotMcNeill Maressa Apollo, MD [facility] [phone]  If 7pm- 7am, please page neurology on call as listed in AMION.

## 2014-01-21 NOTE — Progress Notes (Signed)
Discussed seizure monitoring witn [facility] EEG reading physciain - he reports ongoing pleds 3-10, up to 20 with burst suppression evern 1-2 secs. He reported left arm seizures yest afternoon from 1630-1650 yesterday with no associated change in EEG reading.  We discussed plans to decrease propofol and assess for clinical seizures as her exam is without seizure this am.  Shared with Dr. Pearlean BrownieSethi.Annie Main.  SHARON BIBY, MSN, RN, ANVP-BC, ANP-BC, GNP-BC [facility] Pager: [phone] 01/21/2014 10:30 AM

## 2014-01-21 NOTE — Progress Notes (Signed)
Dr Amada JupiterKirkpatrick made aware of 2 SBP readings after 1st 5mg  bolus of versed. Orders given to stop versed and only restart if pt with clinical signs of sz

## 2014-01-21 NOTE — Procedures (Signed)
PROCEDURE NOTE: CVL PLACEMENT  INDICATION:    Monitoring of central venous pressures and/or administration of medications optimally administered in central vein  CONSENT:   Risks of procedure as well as the alternatives were explained to the patient or surrogate. Consent for procedure obtained. A time out was performed to review patient identification, procedure to be performed, correct patient position, medications/allergies/relevent history, required imaging and test results.  PROCEDURE  Maximum sterile technique was used including antiseptics, cap, gloves, gown, hand hygiene, mask and sheet.  Skin prep: Chlorhexidine; local anesthetic administered  A antimicrobial bonded/coated triple lumen catheter was placed in the R [place] CVL using the Seldinger technique.   EVALUATION:  Blood flow good  Complications: No apparent complications  Patient tolerated the procedure well.  Chest X-ray ordered to verify placement and is pending   Billy Fischeravid Yitta Gongaware, MD PCCM service Mobile [phone]

## 2014-01-22 ENCOUNTER — Inpatient Hospital Stay (HOSPITAL_COMMUNITY): Payer: Medicare Other

## 2014-01-22 DIAGNOSIS — I6350 Cerebral infarction due to unspecified occlusion or stenosis of unspecified cerebral artery: Secondary | ICD-10-CM

## 2014-01-22 DIAGNOSIS — J9600 Acute respiratory failure, unspecified whether with hypoxia or hypercapnia: Secondary | ICD-10-CM

## 2014-01-22 LAB — BASIC METABOLIC PANEL
BUN: 26 mg/dL — ABNORMAL HIGH (ref 6–23)
CO2: 23 mEq/L (ref 19–32)
Calcium: 9 mg/dL (ref 8.4–10.5)
Chloride: 113 mEq/L — ABNORMAL HIGH (ref 96–112)
Creatinine, Ser: 0.56 mg/dL (ref 0.50–1.10)
GFR calc Af Amer: >90 mL/min (ref >90)
GFR, EST NON AFRICAN AMERICAN: 88 mL/min — AB (ref >90)
Glucose, Bld: 145 mg/dL — ABNORMAL HIGH (ref 70–99)
Potassium: 3.8 mEq/L (ref 3.7–5.3)
SODIUM: 148 meq/L — AB (ref 137–147)

## 2014-01-22 LAB — GLUCOSE, CAPILLARY
GLUCOSE-CAPILLARY: 121 mg/dL — AB (ref 70–99)
GLUCOSE-CAPILLARY: 88 mg/dL (ref 70–99)
GLUCOSE-CAPILLARY: 86 mg/dL (ref 70–99)
GLUCOSE-CAPILLARY: 114 mg/dL — AB (ref 70–99)
Glucose-Capillary: 129 mg/dL — ABNORMAL HIGH (ref 70–99)
Glucose-Capillary: 151 mg/dL — ABNORMAL HIGH (ref 70–99)

## 2014-01-22 LAB — VALPROIC ACID LEVEL: VALPROIC ACID LVL: 82.3 ug/mL (ref 50.0–100.0)

## 2014-01-22 LAB — TRIGLYCERIDES: Triglycerides: 123 mg/dL (ref <150)

## 2014-01-22 MED ORDER — METOPROLOL TARTRATE 1 MG/ML IV SOLN
2.5000 mg | INTRAVENOUS | Status: DC | PRN
  Administered 2014-01-22: 2.5 mg via INTRAVENOUS
  Administered 2014-01-22 – 2014-01-24 (×4): 5 mg via INTRAVENOUS
  Filled 2014-01-22 (×5): qty 5

## 2014-01-22 MED ORDER — DIGOXIN 0.05 MG/ML PO SOLN
0.1250 mg | Freq: Every day | ORAL | Status: DC
  Filled 2014-01-22: qty 2.5

## 2014-01-22 MED ORDER — DEXTROSE 5 % IV SOLN
1.0000 g | INTRAVENOUS | Status: DC
  Administered 2014-01-22 – 2014-01-26 (×5): 1 g via INTRAVENOUS
  Filled 2014-01-22 (×5): qty 10

## 2014-01-22 MED ORDER — DIGOXIN 0.25 MG/ML IJ SOLN
0.5000 mg | Freq: Once | INTRAMUSCULAR | Status: AC
  Administered 2014-01-22: 0.5 mg via INTRAVENOUS
  Filled 2014-01-22 (×2): qty 2

## 2014-01-22 MED ORDER — PHENYLEPHRINE HCL 10 MG/ML IJ SOLN
30.0000 ug/min | INTRAVENOUS | Status: DC
  Administered 2014-01-22: 30 ug/min via INTRAVENOUS
  Administered 2014-01-23: 15 ug/min via INTRAVENOUS
  Filled 2014-01-22 (×2): qty 4

## 2014-01-22 MED ORDER — ACETAMINOPHEN 160 MG/5ML PO SOLN
650.0000 mg | Freq: Four times a day (QID) | ORAL | Status: DC | PRN
  Administered 2014-01-22: 650 mg
  Filled 2014-01-22: qty 20.3

## 2014-01-22 MED ORDER — FREE WATER
200.0000 mL | Freq: Three times a day (TID) | Status: DC
  Administered 2014-01-22 – 2014-01-26 (×13): 200 mL

## 2014-01-22 MED ORDER — DIGOXIN 0.05 MG/ML PO SOLN
0.1250 mg | Freq: Every day | ORAL | Status: DC
  Administered 2014-01-23 – 2014-01-26 (×4): 0.125 mg
  Filled 2014-01-22 (×4): qty 2.5

## 2014-01-22 NOTE — Progress Notes (Signed)
NUTRITION FOLLOW-UP  INTERVENTION:  Vital High Protein @ 35 ml/hr  MVI daily  Tube feeding regimen provides 840 kcal, 73 grams of protein, and 702 ml of H2O.  TF regimen and current propofol rate provides 1185 total kcal (104% of kcal needs)  NUTRITION DIAGNOSIS: Inadequate oral intake related to inability to eat as evidenced by NPO status; ongoing.   Goal: Pt to meet >/= 90% of their estimated nutrition needs, met   Monitor:  Vent status, TF tolerance  ASSESSMENT: Pt admitted with R MCA CVA, seizures, and respiratory failure.  Patient is currently intubated on ventilator support MV: 8.1 L/min Temp (24hrs), Avg:98.3 F (36.8 C), Min:97.7 F (36.5 C), Max:99.4 F (37.4 C)  Propofol: 13.1 ml/hr provides 345 kcal from lipid per day  Pt discussed during ICU rounds and with RN.  Pt with abnormal EEG 4/14, ongoing seizures. Remains in propofol coma.   Height: Ht Readings from Last 1 Encounters:  01/25/2014 $RemoveB'5\' 4"'WnMNOdiv$  (1.626 m)    Weight: Wt Readings from Last 1 Encounters:  01/22/14 141 lb 8.6 oz (64.2 kg)  Admission weight 120 lb (54.7 kg)   BMI:  Body mass index is 24.28 kg/(m^2).  Estimated Nutritional Needs: Kcal: 1138 Protein: 65-75 grams Fluid: > 1.5 L/day  Skin: right neck incision  Diet Order:     Intake/Output Summary (Last 24 hours) at 01/22/14 1109 Last data filed at 01/22/14 0600  Gross per 24 hour  Intake 2373.9 ml  Output   1110 ml  Net 1263.9 ml    Last BM: PTA   Labs:   Recent Labs Lab 01/18/14 0304  01/20/14 0250 01/21/14 0225 01/22/14 0500  NA 141  < > 143 144 148*  K 3.5*  < > 3.7 3.8 3.8  CL 105  < > 106 108 113*  CO2 24  < > $R'23 21 23  'It$ BUN 15  < > 24* 32* 26*  CREATININE 0.61  < > 0.61 0.65 0.56  CALCIUM 9.2  < > 9.2 8.8 9.0  MG 1.7  --  1.8  --   --   PHOS 1.9*  --   --   --   --   GLUCOSE 102*  < > 106* 116* 145*  < > = values in this interval not displayed.  CBG (last 3)   Recent Labs  01/21/14 2330 01/22/14 0311  01/22/14 0731  GLUCAP 113* 129* 151*    Scheduled Meds: . amiodarone  400 mg Per Tube BID  . antiseptic oral rinse  15 mL Mouth Rinse QID  . aspirin  81 mg Per Tube Daily  . cefTRIAXone (ROCEPHIN)  IV  1 g Intravenous Q24H  . chlorhexidine  15 mL Mouth Rinse BID  . digoxin  0.125 mg Per Tube Daily  . digoxin  0.5 mg Intravenous Once  . famotidine  20 mg Per Tube BID  . feeding supplement (VITAL HIGH PROTEIN)  1,000 mL Per Tube Q24H  . free water  200 mL Per Tube 3 times per day  . heparin  5,000 Units Subcutaneous 3 times per day  . lacosamide (VIMPAT) IV  200 mg Intravenous Q12H  . levETIRAcetam  2,000 mg Intravenous Q12H  . multivitamin  5 mL Per Tube Daily  . valproic acid (DEPACON) IVPB  1,000 mg Intravenous 3 times per day    Continuous Infusions: . midazolam (VERSED) infusion 24 mg/hr (01/22/14 0823)  . norepinephrine (LEVOPHED) Adult infusion 13 mcg/min (01/22/14 0958)  . phenylephrine (NEO-SYNEPHRINE) Adult  infusion    . propofol 30 mcg/kg/min (01/22/14 0731)   [place], [place], [place] Pager [phone] After Hours Pager

## 2014-01-22 NOTE — Progress Notes (Signed)
Subjective: Burst suppression pattern on EEG. Continues to have ictal appearance to some bursts.   Exam: Filed Vitals:   01/22/14 0845  BP: 113/48  Pulse: 96  Temp:   Resp:    Gen: In bed, intubated  Exam confounded by burst suppression coma.   She has mild extension to pain in RUE, and what appears to be very mild triple flexion vs withdrawal of RLE.   Impression: 79 yo F with refractory status epilepticus now for 5 days. She has MRI changes consistent with hyperperfusion syndrome of the right hemisphere. She had clinical twitching that accompanied her PLEDs yesterday after reduction in propofol supporting an ictal nature to these discharges. She was therefore more aggressively treated with midazolam to achieve burst suppression, despite high doses was not achieving adequate suppression and therefore propofol was added as well at low dose.    Recommendations: 1) triglycerides.  2) will plan on lightening sedation this afternoon. If seizures resume, then I think that her prognosis for recovery at that point would be poor. Even if seizures do not resume I suspect that she will have a prolonged convalescence which will be very difficult at the age of 79.  3) Continue vimpat, keppra, depakote 4) check vpa level   Ritta SlotMcNeill Traevon Meiring, MD [facility] [phone]  If 7pm- 7am, please page neurology on call as listed in AMION.

## 2014-01-22 NOTE — Progress Notes (Signed)
UR completed. D/C planning on hold due to medical status.  Carlyle LipaMichelle Airiana Elman, RN BSN MHA CCM Trauma/Neuro ICU Case Manager [phone]

## 2014-01-22 NOTE — Progress Notes (Signed)
PULMONARY / CRITICAL CARE MEDICINE   Name: Jacqueline StablerBarbara F Cline MRN: [id number] DOB: 04/13/1935    ADMISSION DATE:  01/31/2014  REFERRING MD :  EDP  PRIMARY SERVICE: PCCM   CHIEF COMPLAINT:  CVA w AMS / Resp Fx  BRIEF PATIENT DESCRIPTION: 79 y/o F with recent R carotid stent placement [facility] A[facility] & Seizures.  Found to have R MCA CVA, seizures, and acute respiratory failure.    SIGNIFICANT EVENTS/STUDIES:  4/10 - admit with R MCA CVA, seizures, resp failure, intubated 4/10 Neuro Consult: ASA, Keppra initiated. MRI, EEG, Echo ordered 4/10 CT of head/neck:  acute moderate R MCA CVA without hemorrhagic conversion, remote R basal gaglia & right temporoccipital infarct, severe white matter changes. Neck without acute fracture. 4/10 EEG: This is an abnormal EEG due to general background slowing. Also noted over the right hemisphere is frequent epileptiform activity with phase reversal at T4. This is consistent with the patient's history of new onset seizure and acute right MCA infarct. No evidence of electrographic status epilepticus was noted.  4/10 MRI brain: Large area of altered signal intensity involving the right frontal -parietal -occipital lobe and posterior right temporal lobe as well as left frontal and parietal lobe which demonstrates patchy enhancement. Small area of blood breakdown products right occipital lobe. Appearance is suggestive of the presence of subacute infarcts. Altered signal intensity mid to anterior right temporal lobe, operculum region and subinsular region as well as involving the posterior right thalamus suggestive of acute infarct (infection such as herpes felt to be less likely consideration given the clinical setting). Remote right coronal radiata/centrum semiovale and caudate head infarct with dilated right lateral ventricle and wallerian degeneration. ADDENDUM: Images reviewed with Dr. Anne HahnWillis. The patient has had episode of hypertension and  seizures. The areas of altered signal intensity and enhancement involving the right frontal -temporal -occipital lobe and the posterior right temporal lobe as well as the left frontal and parietal lobe may therefore represent result of posterior reversible encephalopathy syndrome/hypertensive encephalopathy (hyper perfusion post carotid endarterectomy). This may have a reversible component. Presently only small areas hemorrhage is noted in the right occipital aspect. 4/10 MRA head/neck: Occluded left internal carotid artery. Reconstitution of flow at the left carotid terminus is not adequate as there is poor flow seen within the left middle cerebral artery branches and at the level of the left carotid terminus. Ectatic right internal carotid artery cavernous and supra clinoid segment. Middle cerebral artery branch vessel mild irregularity. Moderate narrowing left vertebral artery. Mild irregularity right vertebral artery. Non visualization right posterior inferior cerebellar artery and left anterior inferior cerebellar artery. Mild narrowing and irregularity of the basilar artery which is ectatic. Mild to moderate narrowing and right posterior cerebral artery proximal to terminal branching. 4/11 EEG: This is an abnormal EEG due to clinical and electrographic seizure activity emanating from the right temporal lobe.  4/11 Echocardiogram: LVEF 60-65%. Mild LVH. Grade 2 diastolic dysfunction. Moderate MR. PASP est 51 mmHg 4/12 Overnight EEG: This is an abnormal EEG. Although burst-suppression activity is noted there is concern that the prolonged burst activity seen may represent nonclinical seizure activity. Clinical correlation recommended.  4/13 Overnight EEG: This is an abnormal study. There are near continuous right hemisphere sharps with a temporal and parietal maxima, indicating a partial seizure disorder with seizures arising from the right temporal lobe and frequent electrographic seizures, from a  predominantly right temporal origin.There is also diffuse slowing and disorganization on the  left hemisphere, indicative of a superimposed severe encephalopathy, of nonspecific etiology, consider ictal encephalopathy, toxic, metabolic or drug induced etiologies.  4/14 AFRVR > PRN metoprolol, digoxin. Loaded with amiodarone enterally due to limited IV access 4/14 EEG: burst suppression pattern. Propofol coma continued 4/14 spontaneous conversion back to NSR 4/15 EEG: ongoing pleds 3-10, up to 20 with burst suppression every 1-2 secs. 4/15 Hypertension > nicardipine infusion initiated. Scheduled labetalol initiated. Propofol infusion decreased 4/15  Recurrent hypotension. Norepinephrine initiated. Recurrent AF, mild RVR (HR 100-110/min) 4/16 Remains unresponsive on propofol. Further titration of propofol to off planned. Labetalol held. Remains on amiodarone. Digoxin added. NE changed to phenylephrine. Purulent resp secretions. resp culture obtained and empiric ceftriaxone initiated  LINES / TUBES: ETT 4/10 >>  R [place] CVL 4/15 >>   CULTURES: Resp 4/16 >>   ANTIBIOTICS: Ceftriaxone 4/16 >>   INTERVAL HX:   Unresponsive. No spont movement. Remains on propofol  VITAL SIGNS: Temp:  [97.7 F (36.5 C)-99.4 F (37.4 C)] 97.7 F (36.5 C) (04/16 0313) Pulse Rate:  [64-116] 96 (04/16 0845) Resp:  [6-31] 25 (04/16 0435) BP: (85-160)/(32-103) 113/48 mmHg (04/16 0845) SpO2:  [93 %-100 %] 100 % (04/16 0845) FiO2 (%):  [30 %] 30 % (04/16 0800) Weight:  [64.2 kg (141 lb 8.6 oz)] 64.2 kg (141 lb 8.6 oz) (04/16 0326)  HEMODYNAMICS:    VENTILATOR SETTINGS: Vent Mode:  [-] PRVC FiO2 (%):  [30 %] 30 % Set Rate:  [14 bmp] 14 bmp Vt Set:  [460 mL] 460 mL PEEP:  [5 cmH20] 5 cmH20 Plateau Pressure:  [13 cmH20-15 cmH20] 15 cmH20  INTAKE / OUTPUT: Intake/Output     04/15 0701 - 04/16 0700 04/16 0701 - 04/17 0700   I.V. (mL/kg) 1217.5 (19)    NG/GT 1005    IV Piggyback 660    Total Intake(mL/kg)  2882.5 (44.9)    Urine (mL/kg/hr) 1360 (0.9)    Total Output 1360     Net +1522.5            PHYSICAL EXAMINATION: General: Unresponsive, NAD Neuro: No spont movement, no seizure activity noted HEENT:  Healing R carotid endarterectomy scar, otherwise WNL Cardiovascular: IRIR, rate controlled, no M Lungs: clear anteriorly Abdomen:  Round/soft, bsx4 active  Ext: warm without edema  LABS: I have reviewed all of today's lab results. Relevant abnormalities are discussed in the A/P section  CXR: bibasilar opacities > eff/atx/infiltrates  EKG: no new  ASSESSMENT / PLAN:  NEUROLOGIC A:   Post carotid endarterectomy hyperperfusion syndrome Status epilepticus  Acute Encephalopathy  Poor prognosis P:   Further eval and mgmt per Neuro as noted above  PULMONARY A: Acute Respiratory Failure due to AMS P:   Cont full vent support - settings reviewed and/or adjusted Cont vent bundle Daily SBT if/when meets criteria  CARDIOVASCULAR A:  Mild Elevation Troponin - demand ischemia, doubt MI H/O CAD HTN Emergency, resolved Carotid Occlusion (L) Carotid endarterectomy (R, on 3/31) PAF with RVR - new onset 4/14 Labile BP P:  Cont ASA DNR in event arrest  BP control - goal SBP 100-130 mmHg Cont enteral amiodarone DC Labetalol due to hypotension Cont PRN hydralazine to keep SBP < 130 mmHg Change NE to PE in setting of AF Add scheduled digoxin  RENAL A:   Acute Kidney Injury, resolved P:   Monitor BMET intermittently Monitor I/Os Correct electrolytes as indicated  GASTROINTESTINAL A:   No issues P:   SUP: enteral famotidine Cont TFs  HEMATOLOGIC A:  No issues P:  DVT Px: SQ heparin Monitor CBC intermittently  INFECTIOUS A:   Purulent bronchitis vs PNA P:   Micro and abx as above ENDOCRINE A:   No acute issues   P:   Monitor BMP glucose trends  GLOBAL:   I have personally obtained a history, examined the patient, evaluated laboratory and imaging  results, formulated the assessment and plan and placed orders.  CRITICAL CARE: The patient is critically ill with multiple organ systems failure and requires high complexity decision making for assessment and support, frequent evaluation and titration of therapies, application of advanced monitoring technologies and extensive interpretation of multiple databases. Critical Care Time devoted to patient care services described in this note is 35 minutes.   Billy Fischeravid Adora Yeh, MD ; [facility] Mobile [phone].  After 5:30 PM or weekends, call [phone]

## 2014-01-23 LAB — CBC
HEMATOCRIT: 35 % — AB (ref 36.0–46.0)
Hemoglobin: 11.3 g/dL — ABNORMAL LOW (ref 12.0–15.0)
MCH: 29.7 pg (ref 26.0–34.0)
MCHC: 32.3 g/dL (ref 30.0–36.0)
MCV: 91.9 fL (ref 78.0–100.0)
PLATELETS: 221 10*3/uL (ref 150–400)
RBC: 3.81 MIL/uL — ABNORMAL LOW (ref 3.87–5.11)
RDW: 14.4 % (ref 11.5–15.5)
WBC: 11.4 10*3/uL — AB (ref 4.0–10.5)

## 2014-01-23 LAB — BASIC METABOLIC PANEL
BUN: 28 mg/dL — AB (ref 6–23)
CO2: 25 mEq/L (ref 19–32)
Calcium: 9.1 mg/dL (ref 8.4–10.5)
Chloride: 108 mEq/L (ref 96–112)
Creatinine, Ser: 0.55 mg/dL (ref 0.50–1.10)
GFR calc Af Amer: >90 mL/min (ref >90)
GFR, EST NON AFRICAN AMERICAN: 89 mL/min — AB (ref >90)
Glucose, Bld: 83 mg/dL (ref 70–99)
Potassium: 4 mEq/L (ref 3.7–5.3)
SODIUM: 143 meq/L (ref 137–147)

## 2014-01-23 LAB — GLUCOSE, CAPILLARY
GLUCOSE-CAPILLARY: 86 mg/dL (ref 70–99)
GLUCOSE-CAPILLARY: 87 mg/dL (ref 70–99)
GLUCOSE-CAPILLARY: 91 mg/dL (ref 70–99)
GLUCOSE-CAPILLARY: 99 mg/dL (ref 70–99)
Glucose-Capillary: 101 mg/dL — ABNORMAL HIGH (ref 70–99)

## 2014-01-23 MED ORDER — FENTANYL CITRATE 0.05 MG/ML IJ SOLN: 25.0000 ug | INTRAMUSCULAR | Status: DC | PRN

## 2014-01-23 NOTE — Progress Notes (Signed)
LTVM EEG discontinued. 

## 2014-01-23 NOTE — Progress Notes (Signed)
Subjective: Continues to have a suppressed appearance on EEG. but much more continuous than previously. She has intermittent epileptiform discharges without a clear ictal appearance on my brief review(formal report pending). The appearance is not as concerning for status epilepticus as previously seen.   Exam: Filed Vitals:   01/23/14 0700  BP: 95/43  Pulse: 81  Temp:   Resp:    Gen: In bed, intubated CN: PERRL, Corneals intact.  Motor:     Impression: 79 yo F with refractory status epilepticus now for 5 days. She has MRI changes consistent with hyperperfusion syndrome of the right hemisphere. She had clinical twitching that accompanied her PLEDs yesterday after reduction in propofol supporting an ictal nature to these discharges. She was therefore more aggressively treated with midazolam to achieve burst suppression, despite high doses was not achieving adequate suppression and therefore propofol was added as well at low dose.   Versed has been weaned. No clearly in status epilepticus by EEG.   Recommendations: 1) Continue vimpat, keppra, depakote 2) check vpa level in 2 days(levels can increase following stopping propofol) 3) Will discuss with son today.    Ritta SlotMcNeill Svetlana Bagby, MD [facility] [phone]  If 7pm- 7am, please page neurology on call as listed in AMION.

## 2014-01-23 NOTE — Progress Notes (Signed)
During oral care with a green suction swab, it was noticed that one of the patient's front upper teeth was not in place. The tooth was sitting in the mouth and was removed and placed in a cup with a patient label affixed. No bleeding or other issues were noted from the gums or oral mucosa. Will continue to monitor.

## 2014-01-23 NOTE — Progress Notes (Signed)
PULMONARY / CRITICAL CARE MEDICINE   Name: Jacqueline StablerBarbara F Cline MRN: [id number] DOB: 04/13/1935    ADMISSION DATE:  01/31/2014  REFERRING MD :  EDP  PRIMARY SERVICE: PCCM   CHIEF COMPLAINT:  CVA w AMS / Resp Fx  BRIEF PATIENT DESCRIPTION: 79 y/o F with recent R carotid stent placement [facility] with AMS & Seizures.  Found to have R MCA CVA, seizures, and acute respiratory failure.    SIGNIFICANT EVENTS/STUDIES:  4/10 - admit with R MCA CVA, seizures, resp failure, intubated 4/10 Neuro Consult: ASA, Keppra initiated. MRI, EEG, Echo ordered 4/10 CT of head/neck:  acute moderate R MCA CVA without hemorrhagic conversion, remote R basal gaglia & right temporoccipital infarct, severe white matter changes. Neck without acute fracture. 4/10 EEG: This is an abnormal EEG due to general background slowing. Also noted over the right hemisphere is frequent epileptiform activity with phase reversal at T4. This is consistent with the patient's history of new onset seizure and acute right MCA infarct. No evidence of electrographic status epilepticus was noted.  4/10 MRI brain: Large area of altered signal intensity involving the right frontal -parietal -occipital lobe and posterior right temporal lobe as well as left frontal and parietal lobe which demonstrates patchy enhancement. Small area of blood breakdown products right occipital lobe. Appearance is suggestive of the presence of subacute infarcts. Altered signal intensity mid to anterior right temporal lobe, operculum region and subinsular region as well as involving the posterior right thalamus suggestive of acute infarct (infection such as herpes felt to be less likely consideration given the clinical setting). Remote right coronal radiata/centrum semiovale and caudate head infarct with dilated right lateral ventricle and wallerian degeneration. ADDENDUM: Images reviewed with Dr. Anne HahnWillis. The patient has had episode of hypertension and  seizures. The areas of altered signal intensity and enhancement involving the right frontal -temporal -occipital lobe and the posterior right temporal lobe as well as the left frontal and parietal lobe may therefore represent result of posterior reversible encephalopathy syndrome/hypertensive encephalopathy (hyper perfusion post carotid endarterectomy). This may have a reversible component. Presently only small areas hemorrhage is noted in the right occipital aspect. 4/10 MRA head/neck: Occluded left internal carotid artery. Reconstitution of flow at the left carotid terminus is not adequate as there is poor flow seen within the left middle cerebral artery branches and at the level of the left carotid terminus. Ectatic right internal carotid artery cavernous and supra clinoid segment. Middle cerebral artery branch vessel mild irregularity. Moderate narrowing left vertebral artery. Mild irregularity right vertebral artery. Non visualization right posterior inferior cerebellar artery and left anterior inferior cerebellar artery. Mild narrowing and irregularity of the basilar artery which is ectatic. Mild to moderate narrowing and right posterior cerebral artery proximal to terminal branching. 4/11 EEG: This is an abnormal EEG due to clinical and electrographic seizure activity emanating from the right temporal lobe.  4/11 Echocardiogram: LVEF 60-65%. Mild LVH. Grade 2 diastolic dysfunction. Moderate MR. PASP est 51 mmHg 4/12 Overnight EEG: This is an abnormal EEG. Although burst-suppression activity is noted there is concern that the prolonged burst activity seen may represent nonclinical seizure activity. Clinical correlation recommended.  4/13 Overnight EEG: This is an abnormal study. There are near continuous right hemisphere sharps with a temporal and parietal maxima, indicating a partial seizure disorder with seizures arising from the right temporal lobe and frequent electrographic seizures, from a  predominantly right temporal origin.There is also diffuse slowing and disorganization on the  left hemisphere, indicative of a superimposed severe encephalopathy, of nonspecific etiology, consider ictal encephalopathy, toxic, metabolic or drug induced etiologies.  4/14 AFRVR > PRN metoprolol, digoxin. Loaded with amiodarone enterally due to limited IV access 4/14 EEG: burst suppression pattern. Propofol coma continued 4/14 spontaneous conversion back to NSR 4/15 EEG: ongoing pleds 3-10, up to 20 with burst suppression every 1-2 secs. 4/15 Hypertension > nicardipine infusion initiated. Scheduled labetalol initiated. Propofol infusion decreased 4/15  Recurrent hypotension. Norepinephrine initiated. Recurrent AF, mild RVR (HR 100-110/min) 4/16 Remains unresponsive on propofol. Further titration of propofol to off planned. Labetalol held. Remains on amiodarone. Digoxin added. NE changed to phenylephrine. Purulent resp secretions. resp culture obtained and empiric ceftriaxone initiated 4/17 Off continuous sedatives. No recurrent seizure noted on continuous EEG  LINES / TUBES: ETT 4/10 >>  R [place] CVL 4/15 >>   CULTURES: Resp 4/16 >>   ANTIBIOTICS: Ceftriaxone 4/16 >>   INTERVAL HX:   Unresponsive. Minimal spont movement.   VITAL SIGNS: Temp:  [97.9 F (36.6 C)-101.6 F (38.7 C)] 98.3 F (36.8 C) (04/17 1200) Pulse Rate:  [44-138] 135 (04/17 1642) Resp:  [16-22] 17 (04/17 1642) BP: (81-152)/(40-84) 135/64 mmHg (04/17 1642) SpO2:  [93 %-100 %] 97 % (04/17 1642) FiO2 (%):  [30 %] 30 % (04/17 1642) Weight:  [65.7 kg (144 lb 13.5 oz)] 65.7 kg (144 lb 13.5 oz) (04/17 0500)  HEMODYNAMICS:    VENTILATOR SETTINGS: Vent Mode:  [-] PRVC FiO2 (%):  [30 %] 30 % Set Rate:  [14 bmp] 14 bmp Vt Set:  [460 mL] 460 mL PEEP:  [5 cmH20] 5 cmH20 Plateau Pressure:  [15 cmH20-16 cmH20] 16 cmH20  INTAKE / OUTPUT: Intake/Output     04/16 0701 - 04/17 0700 04/17 0701 - 04/18 0700   I.V. (mL/kg) 1034.3  (15.7) 135.8 (2.1)   NG/GT 1360 245   IV Piggyback 710 325   Total Intake(mL/kg) 3104.3 (47.3) 705.8 (10.7)   Urine (mL/kg/hr) 1525 (1) 580 (0.8)   Total Output 1525 580   Net +1579.3 +125.8          PHYSICAL EXAMINATION: General: Unresponsive, NAD Neuro: No spont movement, no seizure activity noted HEENT:  Healing R carotid endarterectomy scar, otherwise WNL Cardiovascular: IRIR, rate controlled, no M Lungs: clear anteriorly Abdomen:  Round/soft, bsx4 active  Ext: warm without edema  LABS: I have reviewed all of today's lab results. Relevant abnormalities are discussed in the A/P section  CXR: NNF  EKG: no new  ASSESSMENT / PLAN:  NEUROLOGIC A:   Post carotid endarterectomy hyperperfusion syndrome Status epilepticus  Acute Encephalopathy  Poor prognosis P:   Further eval and mgmt per Neuro as noted above  PULMONARY A: Acute Respiratory Failure due to AMS P:   Cont full vent support - settings reviewed and/or adjusted Cont vent bundle Daily SBT if/when meets criteria  CARDIOVASCULAR A:  Mild Elevation Troponin - demand ischemia, doubt MI H/O CAD HTN Emergency, resolved Carotid Occlusion (L) Carotid endarterectomy (R, on 3/31) PAF, now rate controlled - new onset 4/14 Labile BP P:  Cont ASA DNR in event arrest  BP control - goal SBP 100-130 mmHg Cont enteral amiodarone Cont PRN hydralazine to keep SBP < 130 mmHg Cont PE to maintain SBP < 100 mmHg Cont scheduled digoxin  RENAL A:   Acute Kidney Injury, resolved P:   Monitor BMET intermittently Monitor I/Os Correct electrolytes as indicated  GASTROINTESTINAL A:   No issues P:   SUP: enteral famotidine Cont TFs  HEMATOLOGIC A:   No issues P:  DVT Px: SQ heparin Monitor CBC intermittently  INFECTIOUS A:   Purulent bronchitis vs PNA P:   Micro and abx as above  ENDOCRINE A:   No acute issues   P:   Monitor BMP glucose trends  GLOBAL: Son and daughter in law updated @  bedside  I have personally obtained a history, examined the patient, evaluated laboratory and imaging results, formulated the assessment and plan and placed orders.  CRITICAL CARE: The patient is critically ill with multiple organ systems failure and requires high complexity decision making for assessment and support, frequent evaluation and titration of therapies, application of advanced monitoring technologies and extensive interpretation of multiple databases. Critical Care Time devoted to patient care services described in this note is 30 minutes.   Billy Fischeravid Dwayne Bulkley, MD ; [facility] Mobile [phone].  After 5:30 PM or weekends, call [phone]

## 2014-01-24 ENCOUNTER — Inpatient Hospital Stay (HOSPITAL_COMMUNITY): Payer: Medicare Other

## 2014-01-24 LAB — GLUCOSE, CAPILLARY
GLUCOSE-CAPILLARY: 105 mg/dL — AB (ref 70–99)
GLUCOSE-CAPILLARY: 110 mg/dL — AB (ref 70–99)
GLUCOSE-CAPILLARY: 70 mg/dL (ref 70–99)
GLUCOSE-CAPILLARY: 91 mg/dL (ref 70–99)
Glucose-Capillary: 116 mg/dL — ABNORMAL HIGH (ref 70–99)
Glucose-Capillary: 102 mg/dL — ABNORMAL HIGH (ref 70–99)
Glucose-Capillary: 77 mg/dL (ref 70–99)

## 2014-01-24 LAB — BASIC METABOLIC PANEL
BUN: 33 mg/dL — ABNORMAL HIGH (ref 6–23)
CALCIUM: 9.1 mg/dL (ref 8.4–10.5)
CO2: 25 meq/L (ref 19–32)
Chloride: 109 mEq/L (ref 96–112)
Creatinine, Ser: 0.58 mg/dL (ref 0.50–1.10)
GFR calc non Af Amer: 87 mL/min — ABNORMAL LOW (ref >90)
Glucose, Bld: 109 mg/dL — ABNORMAL HIGH (ref 70–99)
Potassium: 3.9 mEq/L (ref 3.7–5.3)
SODIUM: 144 meq/L (ref 137–147)

## 2014-01-24 LAB — CBC
HCT: 35.4 % — ABNORMAL LOW (ref 36.0–46.0)
Hemoglobin: 11.2 g/dL — ABNORMAL LOW (ref 12.0–15.0)
MCH: 29.2 pg (ref 26.0–34.0)
MCHC: 31.6 g/dL (ref 30.0–36.0)
MCV: 92.2 fL (ref 78.0–100.0)
Platelets: 214 10*3/uL (ref 150–400)
RBC: 3.84 MIL/uL — AB (ref 3.87–5.11)
RDW: 14.2 % (ref 11.5–15.5)
WBC: 7.2 10*3/uL (ref 4.0–10.5)

## 2014-01-24 MED ORDER — BACITRACIN-NEOMYCIN-POLYMYXIN OINTMENT TUBE
TOPICAL_OINTMENT | Freq: Two times a day (BID) | CUTANEOUS | Status: DC
  Administered 2014-01-24 (×2): via TOPICAL
  Administered 2014-01-25: 1 via TOPICAL
  Administered 2014-01-25 – 2014-01-26 (×2): via TOPICAL
  Filled 2014-01-24 (×2): qty 1

## 2014-01-24 MED ORDER — METOPROLOL TARTRATE 1 MG/ML IV SOLN: 2.5000 mg | INTRAVENOUS | Status: DC | PRN

## 2014-01-24 MED ORDER — AMIODARONE HCL 200 MG PO TABS
200.0000 mg | ORAL_TABLET | Freq: Every day | ORAL | Status: DC
  Administered 2014-01-25 – 2014-01-26 (×2): 200 mg
  Filled 2014-01-24 (×2): qty 1

## 2014-01-24 MED ORDER — HYDRALAZINE HCL 20 MG/ML IJ SOLN: 5.0000 mg | INTRAMUSCULAR | Status: DC | PRN

## 2014-01-24 MED ORDER — SODIUM CHLORIDE 0.9 % IV BOLUS (SEPSIS)
750.0000 mL | Freq: Once | INTRAVENOUS | Status: AC
  Administered 2014-01-24: 750 mL via INTRAVENOUS

## 2014-01-24 MED ORDER — LORAZEPAM 2 MG/ML IJ SOLN
2.0000 mg | Freq: Once | INTRAMUSCULAR | Status: AC
  Administered 2014-01-24: 2 mg via INTRAVENOUS
  Filled 2014-01-24: qty 1

## 2014-01-24 NOTE — Progress Notes (Addendum)
Subjective: No clear seizures overnight.   Exam: Filed Vitals:   01/24/14 0715  BP: 133/56  Pulse: 48  Temp:   Resp:    Gen: In bed, intubated CN: PERRL, Corneals intact.  Motor: withdrawal vs flexion on right, withdrawal on left Sensory: as above    Impression: 79 yo F with refractory status epilepticus now for 5 days. She has MRI changes consistent with hyperperfusion syndrome of the right hemisphere. She had clinical twitching that accompanied her PLEDs after reduction in propofol supporting an ictal nature to these discharges. She was therefore more aggressively treated with midazolam to achieve burst suppression, despite high doses was not achieving adequate suppression and therefore propofol was added as well at low dose and after that was well burst suppressed. On lightening of sedation, no clear ictal discharges, but did continue to have epileptiform discharges of the right hemisphere.   At this time after discussion with the family, if status epilepticus resumes, will not aggressively treat. Observing for improvement over the weekedn.   Recommendations: 1) Continue vimpat, keppra, depakote 2) check vpa level tomorrow 3) rediscuss monday   Ritta SlotMcNeill Loreli Debruler, MD [facility] [phone]  If 7pm- 7am, please page neurology on call as listed in AMION.

## 2014-01-24 NOTE — Progress Notes (Signed)
PULMONARY / CRITICAL CARE MEDICINE   Name: Doran StablerBarbara F Cypress MRN: [id number] DOB: 10/31/1934    ADMISSION DATE:  January 26, 2014  REFERRING MD :  EDP  PRIMARY SERVICE: PCCM   CHIEF COMPLAINT:  CVA w AMS / Resp Fx  BRIEF PATIENT DESCRIPTION: 79 y/o F with recent R carotid stent placement [facility] with AMS & Seizures.  Found to have R MCA CVA, seizures, and acute respiratory failure.    SIGNIFICANT EVENTS/STUDIES:  4/10 - admit with R MCA CVA, seizures, resp failure, intubated 4/10 Neuro Consult: ASA, Keppra initiated. MRI, EEG, Echo ordered 4/10 CT of head/neck:  acute moderate R MCA CVA without hemorrhagic conversion, remote R basal gaglia & right temporoccipital infarct, severe white matter changes. Neck without acute fracture. 4/10 EEG: This is an abnormal EEG due to general background slowing. Also noted over the right hemisphere is frequent epileptiform activity with phase reversal at T4. This is consistent with the patient's history of new onset seizure and acute right MCA infarct. No evidence of electrographic status epilepticus was noted.  4/10 MRI brain: Large area of altered signal intensity involving the right frontal -parietal -occipital lobe and posterior right temporal lobe as well as left frontal and parietal lobe which demonstrates patchy enhancement. Small area of blood breakdown products right occipital lobe. Appearance is suggestive of the presence of subacute infarcts. Altered signal intensity mid to anterior right temporal lobe, operculum region and subinsular region as well as involving the posterior right thalamus suggestive of acute infarct (infection such as herpes felt to be less likely consideration given the clinical setting). Remote right coronal radiata/centrum semiovale and caudate head infarct with dilated right lateral ventricle and wallerian degeneration. ADDENDUM: Images reviewed with Dr. Anne HahnWillis. The patient has had episode of hypertension and  seizures. The areas of altered signal intensity and enhancement involving the right frontal -temporal -occipital lobe and the posterior right temporal lobe as well as the left frontal and parietal lobe may therefore represent result of posterior reversible encephalopathy syndrome/hypertensive encephalopathy (hyper perfusion post carotid endarterectomy). This may have a reversible component. Presently only small areas hemorrhage is noted in the right occipital aspect. 4/10 MRA head/neck: Occluded left internal carotid artery. Reconstitution of flow at the left carotid terminus is not adequate as there is poor flow seen within the left middle cerebral artery branches and at the level of the left carotid terminus. Ectatic right internal carotid artery cavernous and supra clinoid segment. Middle cerebral artery branch vessel mild irregularity. Moderate narrowing left vertebral artery. Mild irregularity right vertebral artery. Non visualization right posterior inferior cerebellar artery and left anterior inferior cerebellar artery. Mild narrowing and irregularity of the basilar artery which is ectatic. Mild to moderate narrowing and right posterior cerebral artery proximal to terminal branching. 4/11 EEG: This is an abnormal EEG due to clinical and electrographic seizure activity emanating from the right temporal lobe.  4/11 Echocardiogram: LVEF 60-65%. Mild LVH. Grade 2 diastolic dysfunction. Moderate MR. PASP est 51 mmHg 4/12 Overnight EEG: This is an abnormal EEG. Although burst-suppression activity is noted there is concern that the prolonged burst activity seen may represent nonclinical seizure activity. Clinical correlation recommended.  4/13 Overnight EEG: This is an abnormal study. There are near continuous right hemisphere sharps with a temporal and parietal maxima, indicating a partial seizure disorder with seizures arising from the right temporal lobe and frequent electrographic seizures, from a  predominantly right temporal origin.There is also diffuse slowing and disorganization on the  left hemisphere, indicative of a superimposed severe encephalopathy, of nonspecific etiology, consider ictal encephalopathy, toxic, metabolic or drug induced etiologies.  4/14 AFRVR > PRN metoprolol, digoxin. Loaded with amiodarone enterally due to limited IV access 4/14 EEG: burst suppression pattern. Propofol coma continued 4/14 spontaneous conversion back to NSR 4/15 EEG: ongoing pleds 3-10, up to 20 with burst suppression every 1-2 secs. 4/15 Hypertension > nicardipine infusion initiated. Scheduled labetalol initiated. Propofol infusion decreased 4/15  Recurrent hypotension. Norepinephrine initiated. Recurrent AF, mild RVR (HR 100-110/min) 4/16 Remains unresponsive on propofol. Further titration of propofol to off planned. Labetalol held. Remains on amiodarone. Digoxin added. NE changed to phenylephrine. Purulent resp secretions. resp culture obtained and empiric ceftriaxone initiated 4/17 Off continuous sedatives. No recurrent seizure noted on continuous EEG  LINES / TUBES: ETT 4/10 >>  R  CVL 4/15 >>   CULTURES: Resp 4/16 >>   ANTIBIOTICS: Ceftriaxone 4/16 >>   INTERVAL HX:   Minimally responsive. Minimal spont movement. Received hydralazine for SBP > 130. Subsequently hypotensive > NS bolus ordered  VITAL SIGNS: Temp:  [97.5 F (36.4 C)-98.8 F (37.1 C)] 97.5 F (36.4 C) (04/18 1638) Pulse Rate:  [48-159] 70 (04/18 1600) Resp:  [15-25] 22 (04/18 1558) BP: (78-159)/(32-85) 141/55 mmHg (04/18 1600) SpO2:  [92 %-100 %] 96 % (04/18 1600) FiO2 (%):  [30 %] 30 % (04/18 1600) Weight:  [66.4 kg (146 lb 6.2 oz)] 66.4 kg (146 lb 6.2 oz) (04/18 0500)  HEMODYNAMICS:    VENTILATOR SETTINGS: Vent Mode:  [-] PSV;CPAP FiO2 (%):  [30 %] 30 % Set Rate:  [14 bmp] 14 bmp Vt Set:  [460 mL] 460 mL PEEP:  [5 cmH20] 5 cmH20 Pressure Support:  [5 cmH20-10 cmH20] 10 cmH20 Plateau Pressure:  [15  cmH20-16 cmH20] 15 cmH20  INTAKE / OUTPUT: Intake/Output     04/17 0701 - 04/18 0700 04/18 0701 - 04/19 0700   I.V. (mL/kg) 350.5 (5.3) 220 (3.3)   NG/GT 1560 345   IV Piggyback 710 1025   Total Intake(mL/kg) 2620.5 (39.5) 1590 (23.9)   Urine (mL/kg/hr) 1990 (1.2) 430 (0.7)   Total Output 1990 430   Net +630.5 +1160          PHYSICAL EXAMINATION: General: Unresponsive, NAD Neuro: No spont movement, no seizure activity noted HEENT:  Healing R carotid endarterectomy scar, otherwise WNL Cardiovascular: IRIR, rate controlled, no M Lungs: clear anteriorly Abdomen:  Round/soft, bsx4 active  Ext: warm without edema  LABS: I have reviewed all of today's lab results. Relevant abnormalities are discussed in the A/P section  CXR: NNF  EKG: no new  ASSESSMENT / PLAN:  NEUROLOGIC A:   Post carotid endarterectomy hyperperfusion syndrome Status epilepticus  Acute Encephalopathy  Poor prognosis P:   Further eval and mgmt per Neuro as noted above  PULMONARY A: Acute Respiratory Failure due to AMS P:   Cont full vent support - settings reviewed and/or adjusted Cont vent bundle Wean in PSV mode as tolerated Daily SBT if/when meets criteria  CARDIOVASCULAR A:  Mild Elevation Troponin - demand ischemia, doubt MI H/O CAD HTN Emergency, resolved Carotid Occlusion (L) Carotid endarterectomy (R, on 3/31) PAF, now rate controlled - new onset 4/14 Labile BP P:  Cont ASA DNR in event arrest  BP control - goal SBP 100-130 mmHg Cont enteral amiodarone Cont PRN hydralazine to keep SBP < 130 mmHg Cont PE to maintain SBP < 100 mmHg Cont scheduled digoxin  RENAL A:   Acute Kidney Injury, resolved P:  Monitor BMET intermittently Monitor I/Os Correct electrolytes as indicated  GASTROINTESTINAL A:   No issues P:   SUP: enteral famotidine Cont TFs  HEMATOLOGIC A:   No issues P:  DVT Px: SQ heparin Monitor CBC intermittently  INFECTIOUS A:   Purulent bronchitis  vs PNA P:   Micro and abx as above  ENDOCRINE A:   No acute issues   P:   Monitor BMP glucose trends  GLOBAL:   I have personally obtained a history, examined the patient, evaluated laboratory and imaging results, formulated the assessment and plan and placed orders.  CRITICAL CARE: The patient is critically ill with multiple organ systems failure and requires high complexity decision making for assessment and support, frequent evaluation and titration of therapies, application of advanced monitoring technologies and extensive interpretation of multiple databases. Critical Care Time devoted to patient care services described in this note is 30 minutes.   Billy Fischeravid Hatsue Sime, MD ; [facility] Mobile [phone].  After 5:30 PM or weekends, call [phone]

## 2014-01-24 NOTE — Progress Notes (Addendum)
Wasted in sink 80mL of Versed from IV bag.  Val EagleMeghan Johnthomas Lader, RN witnessed.  Witnessed 80 mL of Versed being wasted in the sink. Floreen ComberMeghan D Sheridyn Canino

## 2014-01-24 NOTE — Progress Notes (Signed)
eLink Physician-Brief Progress Note Patient Name: Jacqueline StablerBarbara F Cline DOB: 04/10/1935 MRN: [id number]  Date of Service  01/24/2014   HPI/Events of Note   Hypotension noted.  eICU Interventions   Discussed with bedside RN. Titrate levo and change cuff cycle to q 15 min.    Intervention Category Major Interventions: Hypotension - evaluation and management  Jenelle Magesdam R. Kaydynce Pat 01/24/2014, 5:10 PM

## 2014-01-25 ENCOUNTER — Inpatient Hospital Stay (HOSPITAL_COMMUNITY): Payer: Medicare Other

## 2014-01-25 LAB — BASIC METABOLIC PANEL
BUN: 31 mg/dL — ABNORMAL HIGH (ref 6–23)
CHLORIDE: 106 meq/L (ref 96–112)
CO2: 26 meq/L (ref 19–32)
Calcium: 9.4 mg/dL (ref 8.4–10.5)
Creatinine, Ser: 0.56 mg/dL (ref 0.50–1.10)
GFR calc Af Amer: >90 mL/min (ref >90)
GFR calc non Af Amer: 88 mL/min — ABNORMAL LOW (ref >90)
GLUCOSE: 115 mg/dL — AB (ref 70–99)
Potassium: 3.5 mEq/L — ABNORMAL LOW (ref 3.7–5.3)
SODIUM: 143 meq/L (ref 137–147)

## 2014-01-25 LAB — CULTURE, RESPIRATORY

## 2014-01-25 LAB — CULTURE, RESPIRATORY W GRAM STAIN

## 2014-01-25 LAB — GLUCOSE, CAPILLARY
GLUCOSE-CAPILLARY: 95 mg/dL (ref 70–99)
Glucose-Capillary: 167 mg/dL — ABNORMAL HIGH (ref 70–99)
Glucose-Capillary: 164 mg/dL — ABNORMAL HIGH (ref 70–99)

## 2014-01-25 LAB — VALPROIC ACID LEVEL: VALPROIC ACID LVL: 68.8 ug/mL (ref 50.0–100.0)

## 2014-01-25 LAB — PHENYTOIN LEVEL, TOTAL: Phenytoin Lvl: 7.8 ug/mL — ABNORMAL LOW (ref 10.0–20.0)

## 2014-01-25 MED ORDER — PHENYTOIN SODIUM 50 MG/ML IJ SOLN
100.0000 mg | Freq: Three times a day (TID) | INTRAMUSCULAR | Status: DC
  Administered 2014-01-25 – 2014-01-26 (×4): 100 mg via INTRAVENOUS
  Filled 2014-01-25 (×6): qty 2

## 2014-01-25 MED ORDER — NOREPINEPHRINE BITARTRATE 1 MG/ML IJ SOLN
2.0000 ug/min | INTRAVENOUS | Status: DC
  Administered 2014-01-25: 5 ug/min via INTRAVENOUS
  Administered 2014-01-25: 8 ug/min via INTRAVENOUS
  Filled 2014-01-25 (×2): qty 8

## 2014-01-25 MED ORDER — LORAZEPAM 2 MG/ML IJ SOLN: 1.0000 mg | Freq: Once | INTRAMUSCULAR | Status: DC

## 2014-01-25 MED ORDER — SODIUM CHLORIDE 0.9 % IV SOLN
1000.0000 mg | Freq: Once | INTRAVENOUS | Status: AC
  Administered 2014-01-25: 1000 mg via INTRAVENOUS
  Filled 2014-01-25: qty 20

## 2014-01-25 MED ORDER — LORAZEPAM 2 MG/ML IJ SOLN
INTRAMUSCULAR | Status: AC
  Filled 2014-01-25: qty 1

## 2014-01-25 MED ORDER — STROKE: EARLY STAGES OF RECOVERY BOOK
Freq: Once | Status: AC
  Administered 2014-01-25: 15:00:00
  Filled 2014-01-25: qty 1

## 2014-01-25 NOTE — Progress Notes (Signed)
Subjective: Had facial twitching overnight  Exam: Filed Vitals:   01/25/14 0811  BP: 113/39  Pulse: 62  Temp: 98.4 F (36.9 C)  Resp: 13   Gen: In bed, intubated CN: PERRL, Corneals intact.  Motor: withdrawal x4 Sensory: as above  Vpa level 68  Impression: 79 yo F with refractory status epilepticus. She has MRI changes consistent with hyperperfusion syndrome of the right hemisphere. She had clinical twitching that accompanied her PLEDs after reduction in propofol supporting an ictal nature to these discharges. She was therefore more aggressively treated with midazolam to achieve burst suppression, despite high doses was not achieving adequate suppression and therefore propofol was added as well at low dose and after that was well burst suppressed. On lightening of sedation, no clear ictal discharges, but did continue to have epileptiform discharges of the right hemisphere.   At this time after discussion with the family, if status epilepticus resumes, will not aggressively treat. Observing for improvement over the weekend.  With recurrent seizures, started on phenytoin, but no significant improvement in her exam.    Recommendations: 1) Continue vimpat, keppra, depakote, dilantin 2) rediscuss monday   Ritta SlotMcNeill Jeffrey Voth, MD [facility] [phone]  If 7pm- 7am, please page neurology on call as listed in AMION.

## 2014-01-25 NOTE — Progress Notes (Signed)
PULMONARY / CRITICAL CARE MEDICINE   Name: Jacqueline StablerBarbara F Cline MRN: [id number] DOB: 04/13/1935    ADMISSION DATE:  01/31/2014  REFERRING MD :  EDP  PRIMARY SERVICE: PCCM   CHIEF COMPLAINT:  CVA w AMS / Resp Fx  BRIEF PATIENT DESCRIPTION: 79 y/o F with recent R carotid stent placement who [facility] on [facility]/10 with AMS & Seizures.  Found to have R MCA CVA, seizures, and acute respiratory failure.    SIGNIFICANT EVENTS/STUDIES:  4/10 - admit with R MCA CVA, seizures, resp failure, intubated 4/10 Neuro Consult: ASA, Keppra initiated. MRI, EEG, Echo ordered 4/10 CT of head/neck:  acute moderate R MCA CVA without hemorrhagic conversion, remote R basal gaglia & right temporoccipital infarct, severe white matter changes. Neck without acute fracture. 4/10 EEG: This is an abnormal EEG due to general background slowing. Also noted over the right hemisphere is frequent epileptiform activity with phase reversal at T4. This is consistent with the patient's history of new onset seizure and acute right MCA infarct. No evidence of electrographic status epilepticus was noted.  4/10 MRI brain: Large area of altered signal intensity involving the right frontal -parietal -occipital lobe and posterior right temporal lobe as well as left frontal and parietal lobe which demonstrates patchy enhancement. Small area of blood breakdown products right occipital lobe. Appearance is suggestive of the presence of subacute infarcts. Altered signal intensity mid to anterior right temporal lobe, operculum region and subinsular region as well as involving the posterior right thalamus suggestive of acute infarct (infection such as herpes felt to be less likely consideration given the clinical setting). Remote right coronal radiata/centrum semiovale and caudate head infarct with dilated right lateral ventricle and wallerian degeneration. ADDENDUM: Images reviewed with Dr. Anne HahnWillis. The patient has had episode of hypertension and  seizures. The areas of altered signal intensity and enhancement involving the right frontal -temporal -occipital lobe and the posterior right temporal lobe as well as the left frontal and parietal lobe may therefore represent result of posterior reversible encephalopathy syndrome/hypertensive encephalopathy (hyper perfusion post carotid endarterectomy). This may have a reversible component. Presently only small areas hemorrhage is noted in the right occipital aspect. 4/10 MRA head/neck: Occluded left internal carotid artery. Reconstitution of flow at the left carotid terminus is not adequate as there is poor flow seen within the left middle cerebral artery branches and at the level of the left carotid terminus. Ectatic right internal carotid artery cavernous and supra clinoid segment. Middle cerebral artery branch vessel mild irregularity. Moderate narrowing left vertebral artery. Mild irregularity right vertebral artery. Non visualization right posterior inferior cerebellar artery and left anterior inferior cerebellar artery. Mild narrowing and irregularity of the basilar artery which is ectatic. Mild to moderate narrowing and right posterior cerebral artery proximal to terminal branching. 4/11 EEG: This is an abnormal EEG due to clinical and electrographic seizure activity emanating from the right temporal lobe.  4/11 Echocardiogram: LVEF 60-65%. Mild LVH. Grade 2 diastolic dysfunction. Moderate MR. PASP est 51 mmHg 4/12 Overnight EEG: This is an abnormal EEG. Although burst-suppression activity is noted there is concern that the prolonged burst activity seen may represent nonclinical seizure activity. Clinical correlation recommended.  4/13 Overnight EEG: This is an abnormal study. There are near continuous right hemisphere sharps with a temporal and parietal maxima, indicating a partial seizure disorder with seizures arising from the right temporal lobe and frequent electrographic seizures, from a  predominantly right temporal origin.There is also diffuse slowing and disorganization on the  left hemisphere, indicative of a superimposed severe encephalopathy, of nonspecific etiology, consider ictal encephalopathy, toxic, metabolic or drug induced etiologies.  4/14 AFRVR > PRN metoprolol, digoxin. Loaded with amiodarone enterally due to limited IV access 4/14 EEG: burst suppression pattern. Propofol coma continued 4/14 spontaneous conversion back to NSR 4/15 EEG: ongoing pleds 3-10, up to 20 with burst suppression every 1-2 secs. 4/15 Hypertension > nicardipine infusion initiated. Scheduled labetalol initiated. Propofol infusion decreased 4/15  Recurrent hypotension. Norepinephrine initiated. Recurrent AF, mild RVR (HR 100-110/min) 4/16 Remains unresponsive on propofol. Further titration of propofol to off planned. Labetalol held. Remains on amiodarone. Digoxin added. NE changed to phenylephrine. Purulent resp secretions. resp culture obtained and empiric ceftriaxone initiated 4/17 Off continuous sedatives. No recurrent seizure noted on continuous EEG 4/18 Continuous EEG w/ +seizure activity   LINES / TUBES: ETT 4/10 >>  R [place] CVL 4/15 >>   CULTURES: Resp 4/16 >> Proteus Mirablis ( pan-sens)   ANTIBIOTICS: Ceftriaxone 4/16 >>   INTERVAL HX:   Unresponsive. Seizure act overnight  Dilantin added by Neuro.   VITAL SIGNS: Temp:  [97.5 F (36.4 C)-99.4 F (37.4 C)] 98.4 F (36.9 C) (04/19 0811) Pulse Rate:  [50-90] 59 (04/19 1100) Resp:  [11-25] 13 (04/19 1100) BP: (88-189)/(29-82) 137/47 mmHg (04/19 1100) SpO2:  [96 %-100 %] 100 % (04/19 1100) FiO2 (%):  [30 %] 30 % (04/19 0811) Weight:  [66.6 kg (146 lb 13.2 oz)] 66.6 kg (146 lb 13.2 oz) (04/19 0500)  HEMODYNAMICS:    VENTILATOR SETTINGS: Vent Mode:  [-] PSV;CPAP FiO2 (%):  [30 %] 30 % PEEP:  [5 cmH20] 5 cmH20 Pressure Support:  [10 cmH20] 10 cmH20 Plateau Pressure:  [13 cmH20-14 cmH20] 14 cmH20  INTAKE /  OUTPUT: Intake/Output     04/18 0701 - 04/19 0700 04/19 0701 - 04/20 0700   I.V. (mL/kg) 534 (8) 87.6 (1.3)   NG/GT 1270 70   IV Piggyback 1550 50   Total Intake(mL/kg) 3354 (50.4) 207.6 (3.1)   Urine (mL/kg/hr) 1030 (0.6) 275 (0.8)   Total Output 1030 275   Net +2324 -67.4        Stool Occurrence 1 x      PHYSICAL EXAMINATION: General: Unresponsive, NAD Neuro: No spont movement, no seizure activity noted HEENT:  Healing R carotid endarterectomy scar, otherwise WNL Cardiovascular: IRIR, rate controlled, no M Lungs: clear anteriorly Abdomen:  Round/soft, bsx4 active  Ext: warm without edema  LABS: I have reviewed all of today's lab results. Relevant abnormalities are discussed in the A/P section  CXR: no sign changes   EKG: no new  ASSESSMENT / PLAN:  NEUROLOGIC A:   Post carotid endarterectomy hyperperfusion syndrome Status epilepticus  Acute Encephalopathy  Poor prognosis  P:   Further eval and mgmt per Neuro as noted above Scans reviewed  PULMONARY A: Acute Respiratory Failure due to AMS P:   Cont full vent support - settings reviewed and/or adjusted Cont vent bundle Daily SBT, attempt PS 10-12, goal  2 hrs pcxr for bibasilar changes  CARDIOVASCULAR A:  Mild Elevation Troponin - demand ischemia, doubt MI H/O CAD HTN Emergency, resolved Carotid Occlusion (L) Carotid endarterectomy (R, on 3/31) PAF, now rate controlled - new onset 4/14 Labile BP P:  Cont ASA DNR in event arrest  BP control - goal SBP 100-130 mmHg Cont enteral amiodarone Cont PRN hydralazine to keep SBP < 130 mmHg Cont PE to maintain SBP < 100 mmHg Cont scheduled digoxin  RENAL A:   Acute  Kidney Injury, resolved Hypokalemia mild  P:   Monitor BMET intermittently Monitor I/Os Correct electrolytes as indicated Replete K+  GASTROINTESTINAL A:   No issues P:   SUP: enteral famotidine Cont TFs  HEMATOLOGIC A:   No issues P:  DVT Px: SQ heparin Monitor CBC  intermittently  INFECTIOUS A:   Purulent bronchitis vs PNA P:   Micro and abx as above Re eval bases in am   ENDOCRINE A:   No acute issues   P:   Monitor BMP glucose trends  GLOBAL: Son and daughter in law updated @ bedside  I have personally obtained a history, examined the patient, evaluated laboratory and imaging results, formulated the assessment and plan and placed orders.  CRITICAL CARE: The patient is critically ill with multiple organ systems failure and requires high complexity decision making for assessment and support, frequent evaluation and titration of therapies, application of advanced monitoring technologies and extensive interpretation of multiple databases. Critical Care Time devoted to patient care services described in this note is 30 minutes.    Tammy Parrett NP-C  [facility]  [phone]   Mcarthur RossettiDaniel J. Tyson AliasFeinstein, MD, FACP Pgr: [phone]  Pulmonary & Critical Care

## 2014-01-26 ENCOUNTER — Inpatient Hospital Stay (HOSPITAL_COMMUNITY): Payer: Medicare Other

## 2014-01-26 DIAGNOSIS — Z515 Encounter for palliative care: Secondary | ICD-10-CM

## 2014-01-26 DIAGNOSIS — Z66 Do not resuscitate: Secondary | ICD-10-CM

## 2014-01-26 DIAGNOSIS — R4020 Unspecified coma: Secondary | ICD-10-CM

## 2014-01-26 LAB — CBC
HEMATOCRIT: 32.5 % — AB (ref 36.0–46.0)
HEMOGLOBIN: 10.4 g/dL — AB (ref 12.0–15.0)
MCH: 29.9 pg (ref 26.0–34.0)
MCHC: 32 g/dL (ref 30.0–36.0)
MCV: 93.4 fL (ref 78.0–100.0)
Platelets: 188 10*3/uL (ref 150–400)
RBC: 3.48 MIL/uL — AB (ref 3.87–5.11)
RDW: 14.6 % (ref 11.5–15.5)
WBC: 8.2 10*3/uL (ref 4.0–10.5)

## 2014-01-26 LAB — GLUCOSE, CAPILLARY
Glucose-Capillary: 107 mg/dL — ABNORMAL HIGH (ref 70–99)
Glucose-Capillary: 137 mg/dL — ABNORMAL HIGH (ref 70–99)

## 2014-01-26 LAB — BASIC METABOLIC PANEL
BUN: 25 mg/dL — AB (ref 6–23)
CHLORIDE: 106 meq/L (ref 96–112)
CO2: 28 meq/L (ref 19–32)
Calcium: 9 mg/dL (ref 8.4–10.5)
Creatinine, Ser: 0.48 mg/dL — ABNORMAL LOW (ref 0.50–1.10)
GFR calc Af Amer: >90 mL/min (ref >90)
GFR calc non Af Amer: >90 mL/min (ref >90)
Glucose, Bld: 142 mg/dL — ABNORMAL HIGH (ref 70–99)
Potassium: 3.6 mEq/L — ABNORMAL LOW (ref 3.7–5.3)
Sodium: 143 mEq/L (ref 137–147)

## 2014-01-26 MED ORDER — MORPHINE BOLUS VIA INFUSION
5.0000 mg | INTRAVENOUS | Status: DC | PRN
  Filled 2014-01-26: qty 20

## 2014-01-26 MED ORDER — LORAZEPAM BOLUS VIA INFUSION
2.0000 mg | INTRAVENOUS | Status: DC | PRN
  Filled 2014-01-26: qty 5

## 2014-01-26 MED ORDER — MORPHINE SULFATE 10 MG/ML IJ SOLN
10.0000 mg/h | INTRAVENOUS | Status: DC
  Administered 2014-01-26: 10 mg/h via INTRAVENOUS
  Filled 2014-01-26: qty 10

## 2014-01-26 MED ORDER — LORAZEPAM 2 MG/ML IJ SOLN
5.0000 mg/h | INTRAVENOUS | Status: DC
  Administered 2014-01-26: 5 mg/h via INTRAVENOUS
  Filled 2014-01-26: qty 25

## 2014-01-28 ENCOUNTER — Encounter: Payer: Medicare Other | Admitting: Vascular Surgery

## 2014-01-30 ENCOUNTER — Telehealth: Payer: Self-pay | Admitting: Family Medicine

## 2014-01-30 NOTE — Telephone Encounter (Signed)
Called and LMOVM re: my condolences.

## 2014-01-30 NOTE — Discharge Summary (Addendum)
[place]:  [place]               ACCOUNT NO.:  [id number]  MEDICAL RECORD NO.:  [id number]  LOCATION:                                 FACILITY:  PHYSICIAN:  Nelda Bucksaniel J Ismar Yabut, MD DATE OF BIRTH:  02-25-1935  DATE OF ADMISSION:  01/29/2014 DATE OF DISCHARGE:  01/23/2014                              DISCHARGE SUMMARY   DEATH SUMMARY  This is a 79 year old female with recent right carotid stent placement who [facility], with acute change in mental status, seizures, found to have a right MCA stroke, seizures, and acute respiratory failure.  Significant event in the hospital course was being admitted on April 10, was admitted with a right MCA stroke with seizures and respiratory failure.  CT scan of the head and neck showed acute moderate right-sided MCA, CVA without hemorrhage conversion, __________ right basal ganglia and right temporal occipital infarct with severe white matter changes.  Neck was negative for fracture.  The patient was managed on the ventilator machine.  Neurology was consulted.  Followup head CT was ordered.  Aspirin, carotid Dopplers, propofol were used for sedation.  Serial neuro checks.  The patient was provided Keppra. Unfortunately, this patient had recently had a carotid stent placed by Dr. Edilia Boickson and the concern was that there was obstruction to the stent at this stage as the patient was presenting with severe hypertension as well, was also suffering from acute encephalopathy.  Family was crystal clear that she would not to have aggressive heroic measures under these circumstances, and a plan was set for DNR but to continue therapy for 72 hours of observation.  Apparently, there was discussions with Dr. Delford FieldWright on April 20 through the electronic ICU where family indicated they would want full comfort care.  The patient was provided full comfort care and expired on at 9:47 p.m. on April 20.  FINAL DIAGNOSES UPON DEATH: 1.  Right-sided MCA to rule out embolic phenomenon from the stent. 2. Acute respiratory failure. 3. Atrial fibrillation with rapid ventricular resposnse     Nelda Bucksaniel J Dalvin Clipper, MD     DJF/MEDQ  D:  01/29/2014  T:  01/29/2014  Job:  [id number]

## 2014-02-04 ENCOUNTER — Telehealth: Payer: Self-pay | Admitting: Family Medicine

## 2014-02-04 NOTE — Telephone Encounter (Signed)
LMOVM for pt's son.

## 2014-02-06 NOTE — Progress Notes (Signed)
RT terminally extubated pt for comfort care per CCM and protocol. Patient tolerated well and is resting comfortably.

## 2014-02-06 NOTE — Progress Notes (Signed)
PULMONARY / CRITICAL CARE MEDICINE   Name: Jacqueline Cline MRN: [id number] DOB: 10/03/1935    ADMISSION DATE:  01/13/2014  REFERRING MD :  EDP    CHIEF COMPLAINT:  CVA w AMS / Resp Fx  BRIEF PATIENT DESCRIPTION: 79 y/o F with recent R carotid stent placement who presented to [facility] ER on 4/10 with AMS & Seizures.  Found to have R MCA CVA, seizures, and acute respiratory failure.    SIGNIFICANT EVENTS: 4/10 admit with R MCA CVA, seizures, resp failure, intubated 4/10 Neuro Consult: ASA, Keppra initiated. MRI, EEG, Echo ordered 4/14 AFRVR > PRN metoprolol, digoxin. Loaded with amiodarone enterally due to limited IV access >> conversion to NSR 4/15 Hypertension > nicardipine infusion initiated. Scheduled labetalol initiated. Propofol infusion decreased 4/15  Recurrent hypotension. Norepinephrine initiated. Recurrent AF, mild RVR (HR 100-110/min) 4/16 Remains unresponsive on propofol. Further titration of propofol to off planned. Labetalol held. Remains on amiodarone. Digoxin added. NE changed to phenylephrine. Purulent resp secretions. resp culture obtained and empiric ceftriaxone initiated 4/17 Off continuous sedatives. No recurrent seizure noted on continuous EEG 4/18 Continuous EEG w/ +seizure activity   STUDIES:  4/10 CT of head/neck >> acute Rt MCA CVA, remote Rt basal ganglia and Rt temporoccipital infarcts, severe white matter changes 4/10 EEG >> frequent epileptiform activity Rt hemisphere 4/10 MRI brain >> sub acute infarcts 4/10 MRA head/neck >> occluded Lt internal carotid artery 4/11 EEG >> Rt temporal lobe seizure activity.  4/11 Echocardiogram >> LVEF 60-65%. Mild LVH. Grade 2 diastolic dysfunction. Moderate MR. PASP est 51 mmHg 4/12 EEG >> burst suppression activity 4/13 EEG >> continuous right hemisphere sharps with a temporal and parietal maxima, indicating a partial seizure disorder with seizures arising from the right temporal lobe and frequent electrographic seizures, from a  predominantly right temporal origin 4/14 EEG >> burst suppression pattern 4/15 EEG >> ongoing pleds 3-10, up to 20 with burst suppression every 1-2 secs.  LINES / TUBES: ETT 4/10 >>  R [place] CVL 4/15 >>   CULTURES: Resp 4/16 >> Proteus Mirablis ( pan-sens)   ANTIBIOTICS: Ceftriaxone 4/16 >>   INTERVAL HX:   Tolerating pressure support.  No improvement in neuro status.  Remains on low dose levophed.  VITAL SIGNS: Temp:  [98.2 F (36.8 C)-99 F (37.2 C)] 98.3 F (36.8 C) (04/20 0700) Pulse Rate:  [56-85] 71 (04/20 0846) Resp:  [11-31] 14 (04/20 0846) BP: (93-180)/(29-94) 141/45 mmHg (04/20 0846) SpO2:  [96 %-100 %] 96 % (04/20 0730) FiO2 (%):  [30 %] 30 % (04/20 0326) Weight:  [146 lb 2.6 oz (66.3 kg)] 146 lb 2.6 oz (66.3 kg) (04/20 0400)  VENTILATOR SETTINGS: Vent Mode:  [-] PSV;CPAP FiO2 (%):  [30 %] 30 % PEEP:  [5 cmH20] 5 cmH20 Pressure Support:  [10 cmH20] 10 cmH20 Plateau Pressure:  [14 cmH20-15 cmH20] 14 cmH20  INTAKE / OUTPUT: Intake/Output     04/19 0701 - 04/20 0700 04/20 0701 - 04/21 0700   I.V. (mL/kg) 465.3 (7) 43.8 (0.7)   NG/GT 1205 35   IV Piggyback 525    Total Intake(mL/kg) 2195.3 (33.1) 78.8 (1.2)   Urine (mL/kg/hr) 1130 (0.7) 80 (0.7)   Stool 1 (0)    Total Output 1131 80   Net +1064.3 -1.2          PHYSICAL EXAMINATION: General: no distress Neuro: comatose HEENT: ETT in place Cardiovascular: regular Lungs: scattered rhonchi Abdomen: soft, non tender Ext: no edema  LABS: CBC Recent Labs  01/24/14  0430  01/31/2014  0440  WBC  7.2  8.2  HGB  11.2*  10.4*  HCT  35.4*  32.5*  PLT  214  188    BMET Recent Labs     01/24/14  0430  01/25/14  0225  01/15/2014  0440  NA  144  143  143  K  3.9  3.5*  3.6*  CL  109  106  106  CO2  25  26  28   BUN  33*  31*  25*  CREATININE  0.58  0.56  0.48*  GLUCOSE  109*  115*  142*    Electrolytes Recent Labs     01/24/14  0430  01/25/14  0225  01/24/2014  0440  CALCIUM  9.1  9.4  9.0     Glucose Recent Labs     01/24/14  1641  01/24/14  2030  01/24/14  2333  01/25/14  0350  01/25/14  0815  01/25/14  1205  GLUCAP  77  70  91  95  167*  164*    Imaging Dg Chest Port 1 View  01/25/2014   CLINICAL DATA:  Respiratory failure.  EXAM: PORTABLE CHEST - 1 VIEW  COMPARISON:  Chest x-ray 01/22/2014.  FINDINGS: An endotracheal tube is in place with tip 6.0 cm above the carina. There is a right-sided subclavian central venous catheter with tip terminating in the mid superior vena cava. A nasogastric tube is seen extending into the stomach, however, the tip of the nasogastric tube extends below the lower margin of the image. Lung volumes are low. Bibasilar opacities may reflect areas of atelectasis and/or consolidation, with superimposed small bilateral pleural effusions. Appearance is unchanged. Upper lungs appear clear. No evidence of pulmonary edema. Heart size is normal. Mediastinal contours are distorted by patient's rotation to the right.  IMPRESSION: 1. Allowing for slight differences in patient positioning, the radiographic appearance the chest is essentially unchanged, as detailed above.   Electronically Signed   By: Trudie Reedaniel  Entrikin M.D.   On: 01/25/2014 12:29    .  ASSESSMENT / PLAN:  NEUROLOGIC A:   Acute encephalopathy with coma 2nd to Rt MCA CVA and status epilepticus >> no improvement in neurologic function. P:   Continue AED's per neurology Neurology to update family about poor prognosis, and discuss goals of care  PULMONARY A: Acute Respiratory Failure due to mental status. P:   Pressure support wean as tolerated >> mental status precludes extubation  CARDIOVASCULAR A:  Elevated troponin likely from demand ischemia with hx of CAD. HTN emergency >> BP better controlled. Paroxysmal A fib with RVR. P:  Continue ASA, amiodarone, digoxin  RENAL A:   Acute Kidney Injury >> resolved Hypokalemia. P:   Monitor renal fx, urine outpt F/u and replace  electrolytes as needed  GASTROINTESTINAL A:   Nutrition. P:   SUP: enteral famotidine Continue TFs while on vent  HEMATOLOGIC A:   Anemia of critical illness and chronic disease. P:  DVT Px: SQ heparin Monitor CBC intermittently  INFECTIOUS A:   Proteus tracheobronchitis. P:   Day 5 of rocephin  ENDOCRINE A:   Hyperglycemia. P:   Monitor BMP glucose trends  Goals of Care >> No CPR, no defibrillation.  Neuro to d/w family on 4/20 about whether to proceed with vent withdrawal.  She will likely be able to maintain her respiratory status for several days after extubation.  Otherwise agree with plan for limiting care since her neuro prognosis  is grim.  D/w Dr. Amada JupiterKirkpatrick  CC time 35 minutes.  Coralyn HellingVineet Quanell Loughney, MD [facility] 2014/07/05, 9:06 AM Pager:  [phone] After 3pm call: [phone]

## 2014-02-06 NOTE — Progress Notes (Signed)
EEG completed at bedside.  Results pending. 

## 2014-02-06 NOTE — Procedures (Signed)
History: 79 yo F with refractory status epilepticus.   Sedation: None  Technique: This is a 17 channel routine scalp EEG performed at the bedside with bipolar and monopolar montages arranged in accordance to the international 10/20 system of electrode placement. One channel was dedicated to EKG recording.    Background: The background consists of intermixed delta and theta activities. There are persistent discharges in the right hemisphere sometimes associated with faster activity occurring at a frequency of 1-3 Hz.  Photic stimulation: Physiologic driving is No   EEG Abnormalities: 1) right hemisphere PLEDs 2) generalized rate her slow activity  Clinical Interpretation: This EEG is consistent with a potential area of epileptogenicity in the right hemisphere. Periodic nature of the discharges to suggest an ongoing area of irritability, and the right clinical setting PLEDs sometimes associated with faster activity can be ictal in nature. No definite seizures were recorded.  [name], MD [facility] [phone]  If 7pm- 7am, please page neurology on call as listed in AMION.

## 2014-02-06 NOTE — Progress Notes (Signed)
Family talked to Dr. Delford FieldWright about comfort care.  Morphine and Ativan started at 1815.  Will continue to monitor and when family is ready will call MD/RT to extubate.

## 2014-02-06 NOTE — Progress Notes (Signed)
Body cleaned and sent to morgue at 2250

## 2014-02-06 NOTE — Progress Notes (Addendum)
Subjective: Some twitching yesterday  Exam: Filed Vitals:   Jul 24, 2014 0645  BP: 142/42  Pulse: 72  Temp:   Resp: 14   Gen: In bed, intubated CN: PERRL, Corneals intact.  Motor: flexion x4 Sensory: as above   Impression: 79 yo F with refractory status epilepticus. She has MRI changes consistent with hyperperfusion syndrome of the right hemisphere. She had clinical twitching that accompanied her PLEDs after reduction in propofol supporting an ictal nature to these discharges. She was therefore more aggressively treated with midazolam to achieve burst suppression, despite high doses was not achieving adequate suppression and therefore propofol was added as well at low dose and after that was well burst suppressed. On lightening of sedation, no clear ictal discharges, but did continue to have epileptiform discharges of the right hemisphere.   At this time after discussion with the family, if status epilepticus resumes, will not aggressively treat. Observing for improvement over the weekend.  With recurrent seizures, started on phenytoin, but no significant improvement in her exam.    Recommendations: 1) Continue vimpat, keppra, depakote, dilantin 2) rediscuss today   Ritta SlotMcNeill Steven Basso, MD [facility] [phone]  If 7pm- 7am, please page neurology on call as listed in AMION.   Discussed case with son. She contineus to have at best flexion response. Her EEG shows continued PLEDs. I suspect ehr prognosis is poor in any case, and she has made it clear that she did not want long term ventilation(e.g. Janina Mayorach). Given this, he felt that it would be most consistent with her wishes to pursue comfort care at this time and I think that is a reasonable decision.   Ritta SlotMcNeill Massiel Stipp, MD [facility] [phone]  If 7pm- 7am, please page neurology on call as listed in AMION.

## 2014-02-06 NOTE — Plan of Care (Signed)
Problem: Discharge Progression Outcomes Goal: Discharge plan in place and appropriate Outcome: Not Met (add Reason) Patient extubated at 2000 per withdrawal protocol

## 2014-02-06 NOTE — Progress Notes (Signed)
NUTRITION FOLLOW-UP  INTERVENTION:  If pt remains intubated recommend increase to new goal: Vital High Protein @ 50 ml/hr  Tube feeding regimen provides 1200 kcal (103% of needs), 105 grams of protein, and 1003 ml of H2O.   NUTRITION DIAGNOSIS: Inadequate oral intake related to inability to eat as evidenced by NPO status; ongoing.   Goal: Pt to meet >/= 90% of their estimated nutrition needs, met   Monitor:  Vent status, TF tolerance  ASSESSMENT: Pt admitted with R MCA CVA, seizures, and respiratory failure.  Patient is currently intubated on ventilator support MV: 7.5 L/min Temp (24hrs), Avg:98.5 F (36.9 C), Min:98.2 F (36.8 C), Max:99 F (37.2 C)  Propofol off MD's note ongoing seizures and poor prognosis.  Pt discussed during ICU rounds and with RN. Per RN family to discuss with MD's today with likely plan to transition to comfort.   Current TF: Vital High Protein @ 35 ml/hr.  Tube feeding regimen provides 840 kcal, 73 grams of protein, and 702 ml of H2O.   Height: Ht Readings from Last 1 Encounters:  01/15/2014 _0  (1.626 m)    Weight: Wt Readings from Last 1 Encounters:  20-Feb-2014 146 lb 2.6 oz (66.3 kg)  Admission weight 120 lb (54.7 kg)   BMI:  Body mass index is 25.08 kg/(m^2).  Estimated Nutritional Needs: Kcal: 1169 Protein: 65-75 grams Fluid: > 1.5 L/day  Skin: right neck incision  Diet Order:     Intake/Output Summary (Last 24 hours) at 02/20/14 1003 Last data filed at 2014/02/20 1000  Gross per 24 hour  Intake 2224.55 ml  Output   1305 ml  Net 919.55 ml    Last BM: PTA   Labs:   Recent Labs Lab 01/20/14 0250  01/24/14 0430 01/25/14 0225 2014/02/20 0440  NA 143  < > 144 143 143  K 3.7  < > 3.9 3.5* 3.6*  CL 106  < > 109 106 106  CO2 23  < > _1 BUN 24*  < > 33* 31* 25*  CREATININE 0.61  < > 0.58 0.56 0.48*  CALCIUM 9.2  < > 9.1 9.4 9.0  MG 1.8  --   --   --   --   GLUCOSE 106*  < > 109* 115* 142*  < > = values in this  interval not displayed.  CBG (last 3)   Recent Labs  01/25/14 0350 01/25/14 0815 01/25/14 1205  GLUCAP 95 167* 164*    Scheduled Meds: . amiodarone  200 mg Per Tube Daily  . antiseptic oral rinse  15 mL Mouth Rinse QID  . aspirin  81 mg Per Tube Daily  . cefTRIAXone (ROCEPHIN)  IV  1 g Intravenous Q24H  . chlorhexidine  15 mL Mouth Rinse BID  . digoxin  0.125 mg Per Tube Daily  . famotidine  20 mg Per Tube BID  . feeding supplement (VITAL HIGH PROTEIN)  1,000 mL Per Tube Q24H  . free water  200 mL Per Tube 3 times per day  . heparin  5,000 Units Subcutaneous 3 times per day  . lacosamide (VIMPAT) IV  200 mg Intravenous Q12H  . levETIRAcetam  2,000 mg Intravenous Q12H  . LORazepam  1 mg Intravenous Once  . multivitamin  5 mL Per Tube Daily  . neomycin-bacitracin-polymyxin   Topical BID  . phenytoin (DILANTIN) IV  100 mg Intravenous 3 times per day  . valproic acid (DEPACON) IVPB  1,000 mg Intravenous 3 times  per day    Continuous Infusions: . norepinephrine (LEVOPHED) Adult infusion Stopped (Feb 05, 2014 0900)   [place], [place], CNSC [phone] Pager [phone] After Hours Pager

## 2014-02-06 NOTE — Progress Notes (Signed)
Patient extubated at 2000 per withdrawal protocol. Family notified at 2001. Patient seems to be resting and  comfortable.

## 2014-02-06 NOTE — Progress Notes (Signed)
At 2146 patient passed away. Monitor showed flat lines and 2 RN listened to the heart, no heart beat heard. MD Notified at 2152. Family at bed side. Emotional support given to the family.

## 2014-02-06 NOTE — Progress Notes (Signed)
eLink Physician-Brief Progress Note Patient Name: Jacqueline StablerBarbara F Cline DOB: 06/06/1935 MRN: [id number]  Date of Service  01/25/2014   HPI/Events of Note  Discussion with the family. The pt is now full comfort care    eICU Interventions  See orders    Intervention Category Major Interventions: End of life / care limitation discussion  Storm Friskatrick E Anzel Kearse 01/12/2014, 5:30 PM

## 2014-02-06 NOTE — Progress Notes (Signed)
eLink Physician-Brief Progress Note Patient Name: Doran StablerBarbara F Batton DOB: 06/15/1935 MRN: [id number]  Date of Service  01/20/2014   HPI/Events of Note  PT expired 947PM   eICU Interventions  none   Intervention Category Major Interventions: End of life / care limitation discussion  Storm Friskatrick E Anwar Sakata 02/04/2014, 9:53 PM

## 2014-02-06 DEATH — deceased

## 2014-03-08 ENCOUNTER — Other Ambulatory Visit: Payer: Self-pay | Admitting: Family Medicine

## 2015-09-12 IMAGING — CT CT HEAD W/O CM
1 series · 15 of 30 positions shown, 19 images · non-contrast
Comparison: None.

CLINICAL DATA: 78-year-old female with vision loss. Newly diagnosed
carotid blockages. Initial encounter.

EXAM:
CT HEAD WITHOUT CONTRAST
TECHNIQUE: Contiguous axial images were obtained from the base of the skull
through the vertex without intravenous contrast.

[Series 2: head_seq -c 4.5 h37s st · axial · 0.42mm/px · z∈[+1364,+1490]mm · 15 of 32 slices shown, 19 images]
[im 2/32  brain]
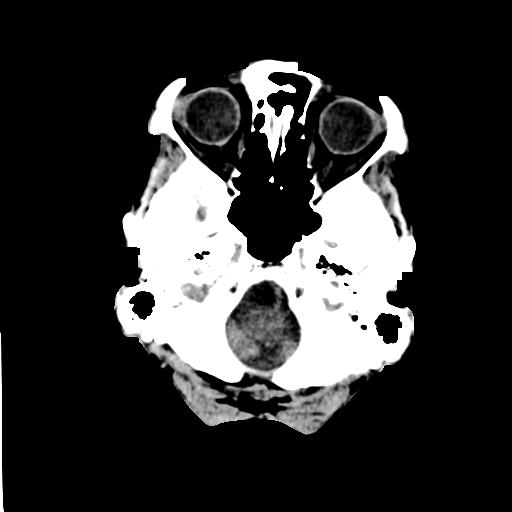
[im 2/32  bone]
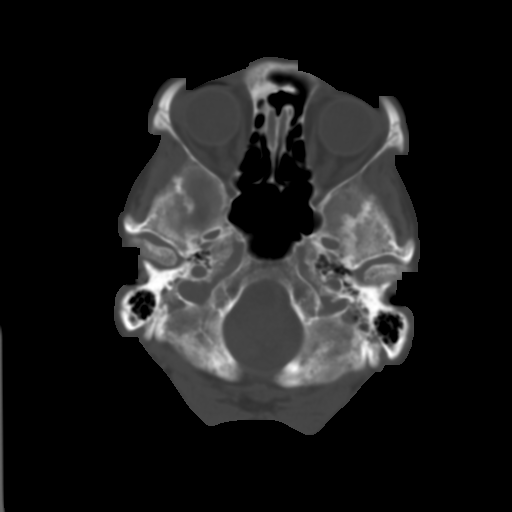
[im 4/32  brain]
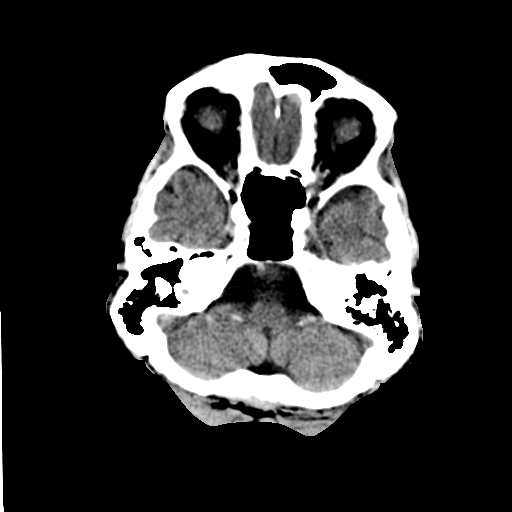
[im 6/32  brain]
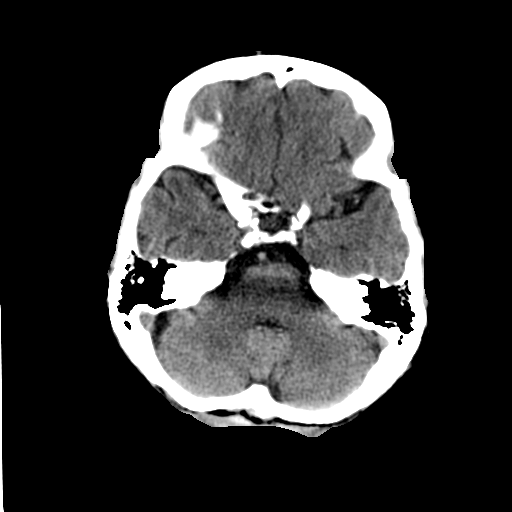
[im 8/32  brain]
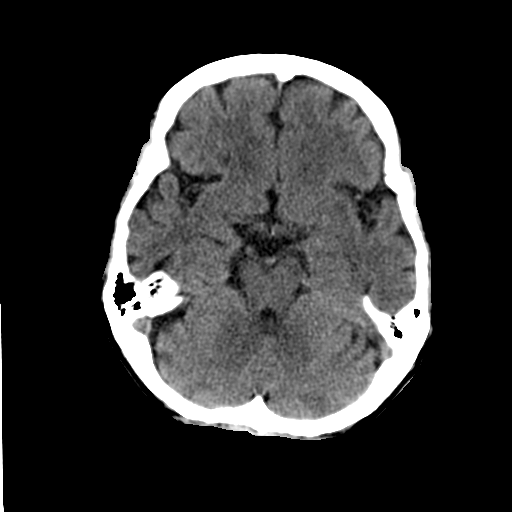
[im 10/32  brain]
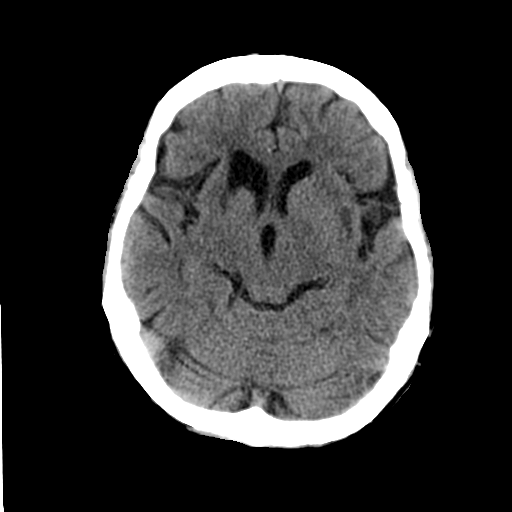
[im 10/32  bone]
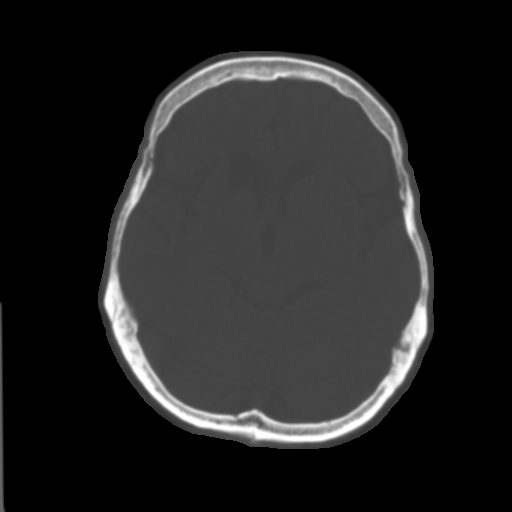
[im 12/32  brain]
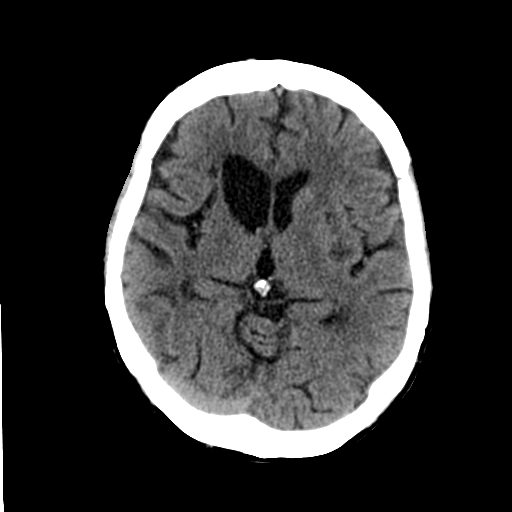
[im 14/32  brain]
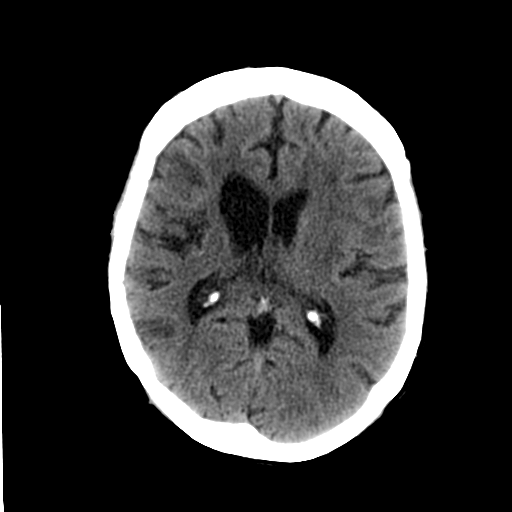
[im 17/32  brain]
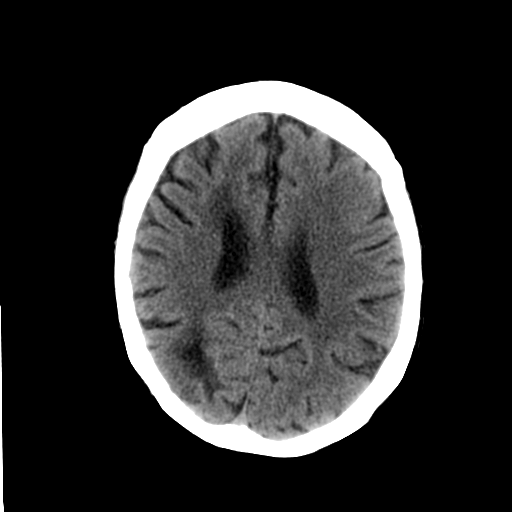
[im 18/32  brain]
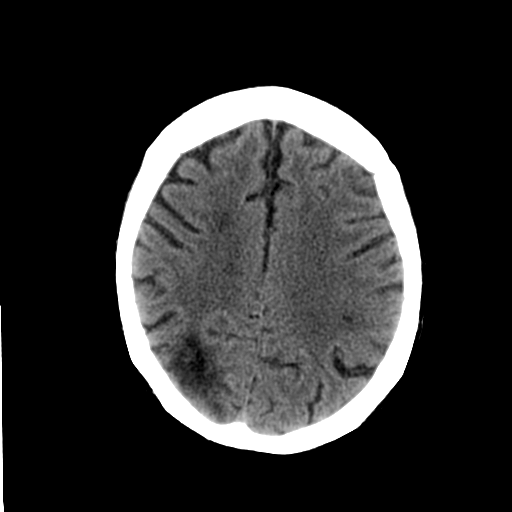
[im 18/32  bone]
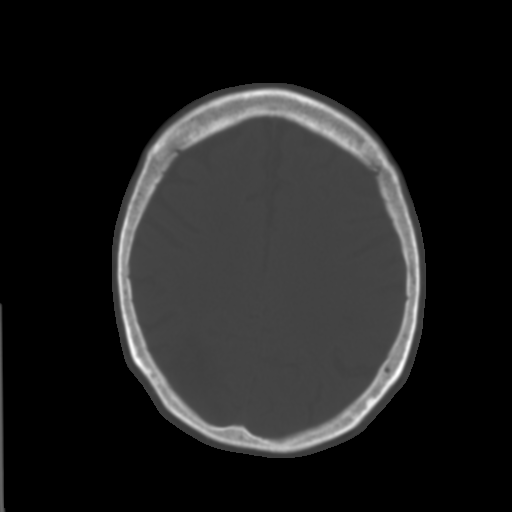
[im 20/32  brain]
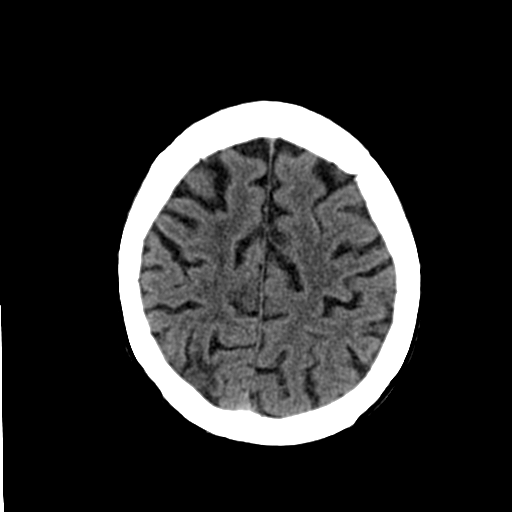
[im 22/32  brain]
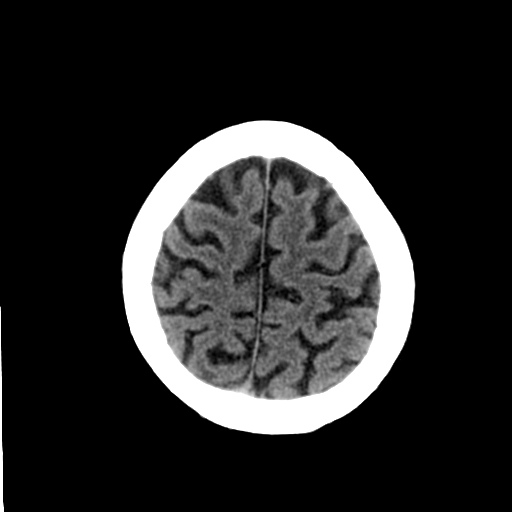
[im 24/32  brain]
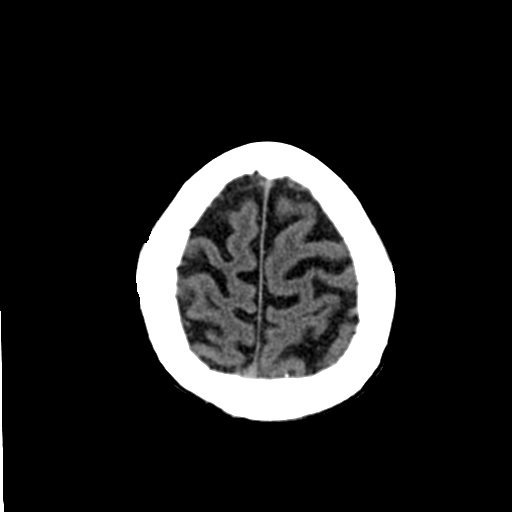
[im 26/32  brain]
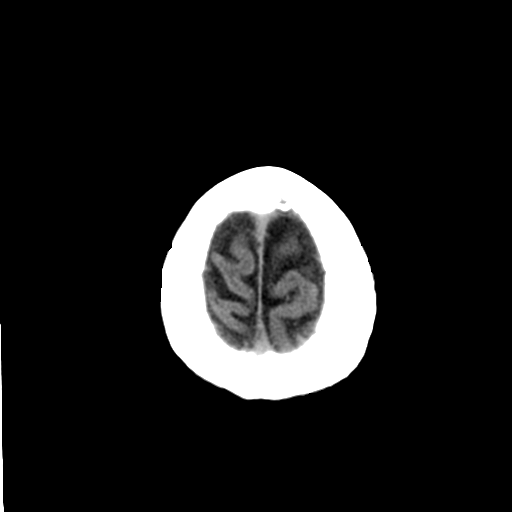
[im 26/32  bone]
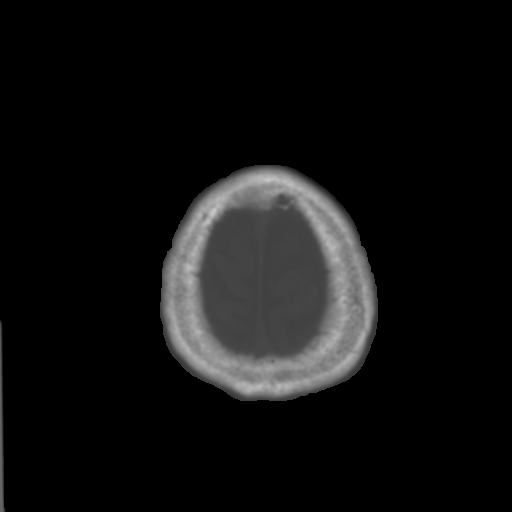
[im 28/32  brain]
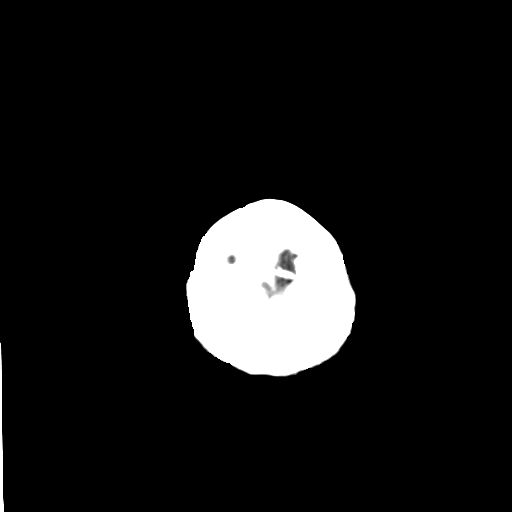
[im 30/32  brain]
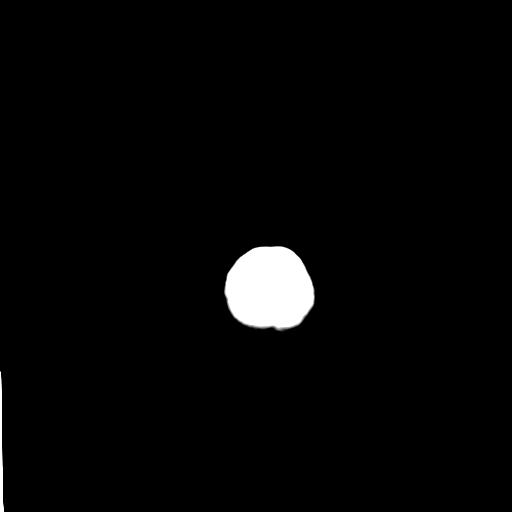

[15 of 30 positions shown; findings below may reference images not displayed]

FINDINGS: Normal visualized orbits soft tissues.

Visualized scalp soft tissues are within normal limits. Visualized
paranasal sinuses and mastoids are clear. No acute osseous
abnormality identified.

Extensive Calcified atherosclerosis at the skull base.

Remote right basal ganglia and right insula infarct with ex vacuo
changes to the right frontal horn. Hypodensity extends to the right
anterior frontal lobe periventricular white matter where there may
also be a small focus of chronic petechial hemorrhage (series 2,
image 16). Superimposed posterior right MCA chronic cortically based
infarct with encephalomalacia (series 2, image 18).

No evidence of cortically based acute infarction identified. No
acute intracranial hemorrhage identified. No midline shift, mass
effect, or evidence of intracranial mass lesion. No
ventriculomegaly.
IMPRESSION: 1. Chronic right MCA territory ischemia. No acute intracranial
abnormality.
2. Extensive calcified atherosclerosis of the ICA siphons.

## 2015-09-15 IMAGING — CR DG CHEST 2V
2 series · 2 of 2 positions shown · non-contrast
Comparison: None.

CLINICAL DATA: Hypertension.

EXAM:
CHEST  2 VIEW

[w chest pa]
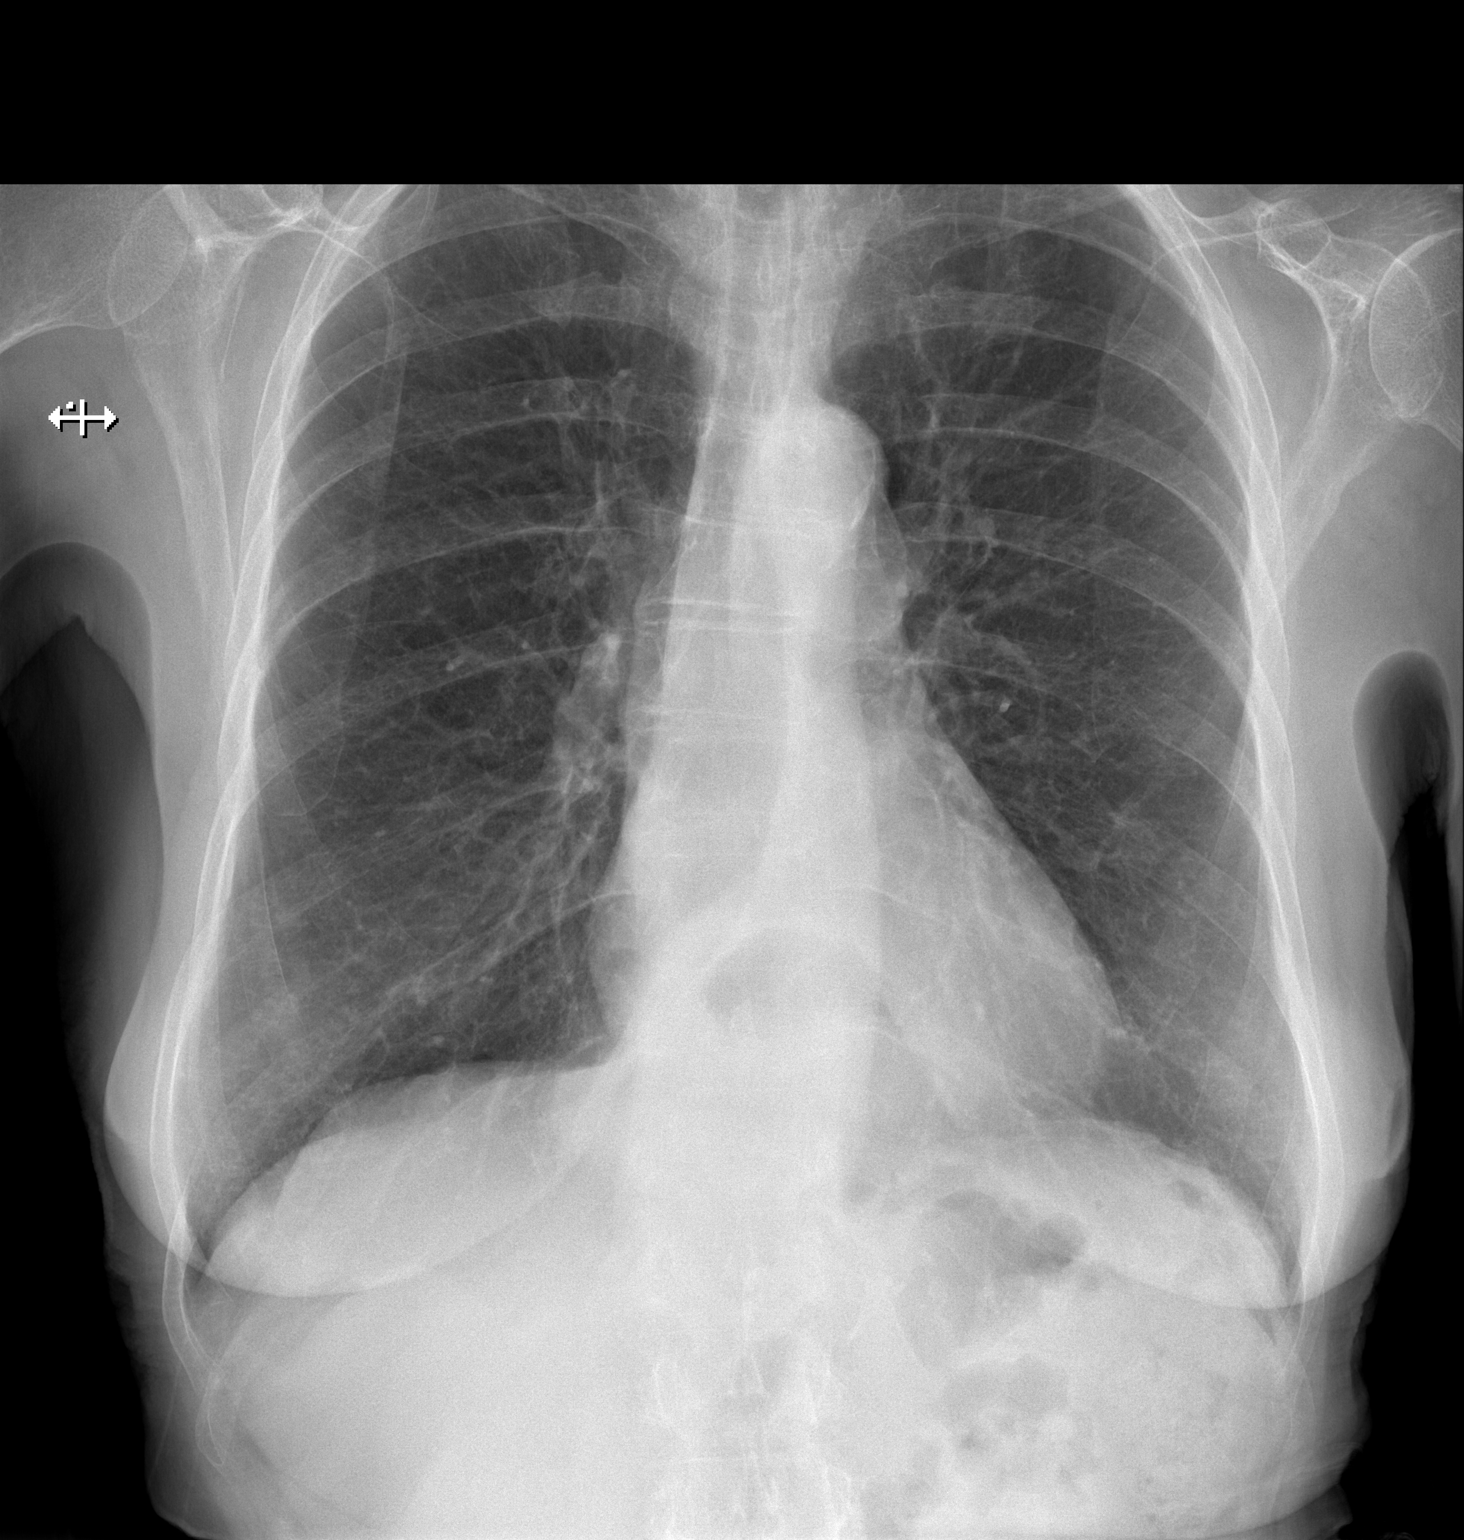

[w chest lat]
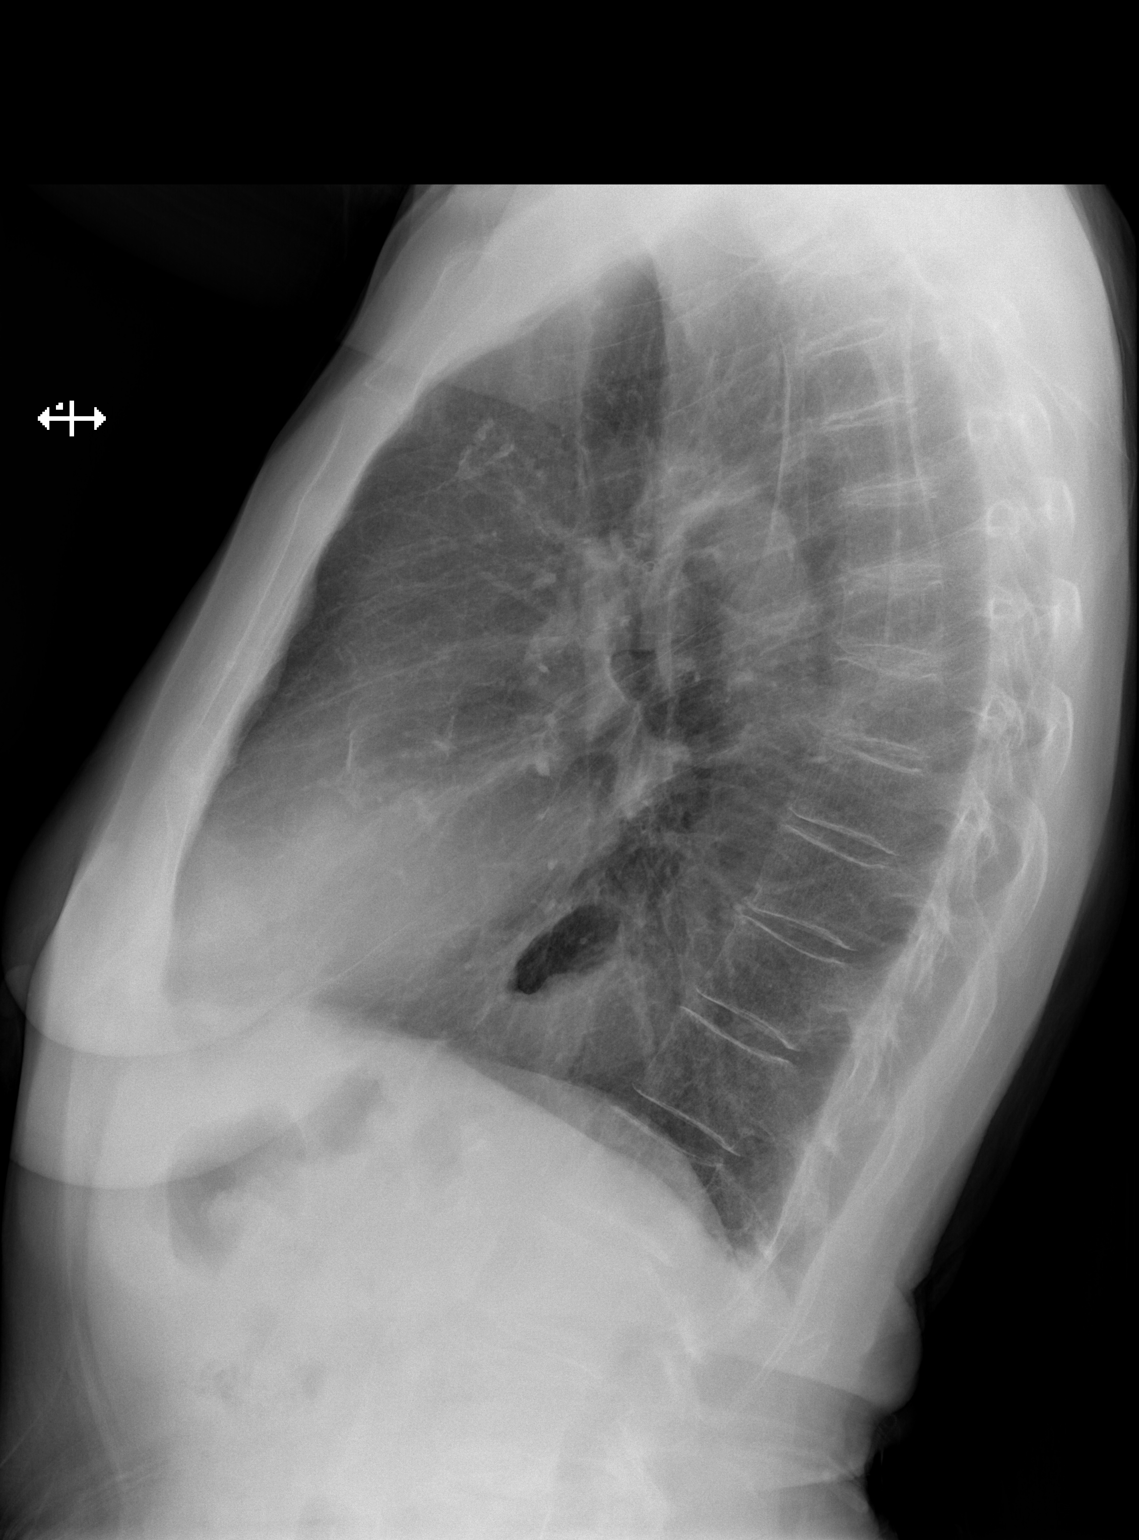

[2 of 2 positions shown; findings below may reference images not displayed]

FINDINGS: Mediastinum and hilar structures are normal. The lungs are clear of
acute infiltrates. Heart size normal. Sliding hiatal hernia. No
acute bony abnormality.
IMPRESSION: No active cardiopulmonary disease.  Sliding hiatal hernia .
